# Patient Record
Sex: Male | Born: 1950 | Marital: Married | Smoking: Former smoker
Health system: Southern US, Community
[De-identification: ages and names within clinical notes are randomized; demographics above are authoritative.]

## PROBLEM LIST (undated history)

## (undated) DIAGNOSIS — E119 Type 2 diabetes mellitus without complications: Secondary | ICD-10-CM

## (undated) HISTORY — PX: ELBOW SURGERY: SHX618

## (undated) HISTORY — PX: APPENDECTOMY: SHX54

---
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
## 2008-10-10 ENCOUNTER — Encounter: Payer: Self-pay | Admitting: Gastroenterology

## 2010-05-10 ENCOUNTER — Encounter (INDEPENDENT_AMBULATORY_CARE_PROVIDER_SITE_OTHER): Payer: Self-pay | Admitting: *Deleted

## 2010-06-18 ENCOUNTER — Encounter (INDEPENDENT_AMBULATORY_CARE_PROVIDER_SITE_OTHER): Payer: Self-pay | Admitting: *Deleted

## 2010-06-18 ENCOUNTER — Ambulatory Visit: Payer: Self-pay | Admitting: Internal Medicine

## 2010-06-29 ENCOUNTER — Telehealth: Payer: Self-pay | Admitting: Internal Medicine

## 2011-01-27 NOTE — Miscellaneous (Signed)
Summary: [facility] Previsit/prep  Clinical Lists Changes  Medications: Added new medication of MOVIPREP 100 GM  SOLR (PEG-KCL-NACL-NASULF-NA ASC-C) As per prep instructions. - Signed Rx of MOVIPREP 100 GM  SOLR (PEG-KCL-NACL-NASULF-NA ASC-C) As per prep instructions.;  #1 x 0;  Signed;  Entered by: Wyona Almas RN;  Authorized by: Iva Boop MD, [facility];  Method used: Electronically to [facility] [phone]*, [address] [place], [place]  [address], Ph: [phone], Fax: [phone] Allergies: Added new allergy or adverse reaction of OXYCODONE HCL Observations: Added new observation of NKA: F (06/18/2010 14:13)    Prescriptions: MOVIPREP 100 GM  SOLR (PEG-KCL-NACL-NASULF-NA ASC-C) As per prep instructions.  #1 x 0   Entered by:   Wyona Almas RN   Authorized by:   Iva Boop MD, [facility]   Signed by:   Wyona Almas RN on 06/18/2010   Method used:   Electronically to        [facility] [phone]* (retail)       [address]       [place], [place]  [address]       Ph: [phone]       Fax: [phone]   RxID:   [id number]

## 2011-01-27 NOTE — Letter (Signed)
Summary: Previsit letter  [facility]  [address] [place], [place] [address]   Phone: [phone]  Fax: [phone]       05/10/2010 MRN: [id number]  [facility] [address] [place], [place]  [address]  Dear Mr. Sergio Dominguez,  Welcome to the Gastroenterology Division at [facility].    You are scheduled to see a nurse for your pre-procedure visit on 06-16-10 at 8:00a.m. on the 3rd floor at [facility], [address]  We ask that you try to arrive at our office 15 minutes prior to your appointment time to allow for check-in.  Your nurse visit will consist of discussing your medical and surgical history, your immediate family medical history, and your medications.    Please bring a complete list of all your medications or, if you prefer, bring the medication bottles and we will list them.  We will need to be aware of both prescribed and over the counter drugs.  We will need to know exact dosage information as well.  If you are on blood thinners (Coumadin, Plavix, Aggrenox, Ticlid, etc.) please call our office today/prior to your appointment, as we need to consult with your physician about holding your medication.   Please be prepared to read and sign documents such as consent forms, a financial agreement, and acknowledgement forms.  If necessary, and with your consent, a friend or relative is welcome to sit-in on the nurse visit with you.  Please bring your insurance card so that we may make a copy of it.  If your insurance requires a referral to see a specialist, please bring your referral form from your primary care physician.  No co-pay is required for this nurse visit.     If you cannot keep your appointment, please call [phone] to cancel or reschedule prior to your appointment date.  This allows [place] the opportunity to schedule an appointment for another patient in need of care.    Thank you for choosing [facility] for your medical needs.   We appreciate the opportunity to care for you.  Please visit [place] at our website  to learn more about our practice.                     Sincerely.                                                                                                                   The Gastroenterology Division

## 2011-01-27 NOTE — Letter (Signed)
Summary: [facility] Instructions  [facility]  [address] [place], [place] [address]   Phone: [phone]  Fax: [phone]       LEXIE Dominguez    [birth year]    MRN: [id number]        Procedure Day [name]:  [facility]  06/30/10     Arrival Time:  7:30AM     Procedure Time:  8:30AM     Location of Procedure:                    Juliann Pares _  [facility] (4th Floor)                        PREPARATION FOR COLONOSCOPY WITH MOVIPREP   Starting 5 days prior to your procedure 06/25/10 do not eat nuts, seeds, popcorn, corn, beans, peas,  salads, or any raw vegetables.  Do not take any fiber supplements (e.g. Metamucil, Citrucel, and Benefiber).  THE DAY BEFORE YOUR PROCEDURE         DATE: 06/29/10  DAY: TUESDAY  1.  Drink clear liquids the entire day-NO SOLID FOOD  2.  Do not drink anything colored red or purple.  Avoid juices with pulp.  No orange juice.  3.  Drink at least 64 oz. (8 glasses) of fluid/clear liquids during the day to prevent dehydration and help the prep work efficiently.  CLEAR LIQUIDS INCLUDE: Water Jello Ice Popsicles Tea (sugar ok, no milk/cream) Powdered fruit flavored drinks Coffee (sugar ok, no milk/cream) Gatorade Juice: apple, white grape, white cranberry  Lemonade Clear bullion, consomm, broth Carbonated beverages (any kind) Strained chicken noodle soup Hard Candy                             4.  In the morning, mix first dose of MoviPrep solution:    Empty 1 Pouch A and 1 Pouch B into the disposable container    Add lukewarm drinking water to the top line of the container. Mix to dissolve    Refrigerate (mixed solution should be used within 24 hrs)  5.  Begin drinking the prep at 5:00 p.m. The MoviPrep container is divided by 4 marks.   Every 15 minutes drink the solution down to the next mark (approximately 8 oz) until the full liter is complete.   6.  Follow completed prep with 16 oz of clear liquid of your choice (Nothing  red or purple).  Continue to drink clear liquids until bedtime.  7.  Before going to bed, mix second dose of MoviPrep solution:    Empty 1 Pouch A and 1 Pouch B into the disposable container    Add lukewarm drinking water to the top line of the container. Mix to dissolve    Refrigerate  THE DAY OF YOUR PROCEDURE      DATE: 06/30/10  DAY: WEDNESDAY  Beginning at 3:30AM (5 hours before procedure):         1. Every 15 minutes, drink the solution down to the next mark (approx 8 oz) until the full liter is complete.  2. Follow completed prep with 16 oz. of clear liquid of your choice.    3. You may drink clear liquids until 6:30AM (2 HOURS BEFORE PROCEDURE).   MEDICATION INSTRUCTIONS  Unless otherwise instructed, you should take regular prescription medications with a small sip of water   as early as possible the morning  of your procedure.  Diabetic patients - see separate instructions.          OTHER INSTRUCTIONS  You will need a responsible adult at least 61 years of age to accompany you and drive you home.   This person must remain in the waiting room during your procedure.  Wear loose fitting clothing that is easily removed.  Leave jewelry and other valuables at home.  However, you may wish to bring a book to read or  an iPod/MP3 player to listen to music as you wait for your procedure to start.  Remove all body piercing jewelry and leave at home.  Total time from sign-in until discharge is approximately 2-3 hours.  You should go home directly after your procedure and rest.  You can resume normal activities the  day after your procedure.  The day of your procedure you should not:   Drive   Make legal decisions   Operate machinery   Drink alcohol   Return to work  You will receive specific instructions about eating, activities and medications before you leave.    The above instructions have been reviewed and explained to me by   Wyona Almas RN  June 18, 2010 3:25 PM     I fully understand and can verbalize these instructions _____________________________ Date _________

## 2011-01-27 NOTE — Progress Notes (Signed)
Summary: Cancel Colonoscopy  Phone Note Call from Patient   Caller: Patient Summary of Call: Pt called to cancel his colonoscopy for 06/30/10 as he has no transportation.  Advised pt he could be charged cancellation fee for cancelling the day before his procedure.  Dr. Leone Payor do you want to charge cancellation fee? Initial call taken by: Francee Piccolo CMA Duncan Dull),  June 29, 2010 11:38 AM  Follow-up for Phone Call        No charge he should reschedule Follow-up by: Iva Boop MD, Clementeen Graham,  June 29, 2010 11:41 AM  Additional Follow-up for Phone Call Additional follow up Details #1::        Patient NOT BILLED. Called pt to urge him to reschedule but he will call back when he has available transportation. Additional Follow-up by: Leanor Kail [facility],  July 06, 2010 4:22 PM

## 2011-01-27 NOTE — Letter (Signed)
Summary: Diabetic Instructions  [facility]  [address] [place], [place] [address]   Phone: [phone]  Fax: [phone]    DEMPSY DAMIANO 1951/10/14 MRN: [id number]   _  x_   ORAL DIABETIC MEDICATION INSTRUCTIONS  The day before your procedure:   Take your diabetic pill as you do normally  The day of your procedure:   Do not take your diabetic pill    We will check your blood sugar levels during the admission process and again in Recovery before discharging you home  ________________________________________________________________________

## 2012-09-25 ENCOUNTER — Other Ambulatory Visit: Payer: Self-pay | Admitting: Family Medicine

## 2012-09-25 DIAGNOSIS — Z136 Encounter for screening for cardiovascular disorders: Secondary | ICD-10-CM

## 2012-09-27 ENCOUNTER — Other Ambulatory Visit: Payer: Self-pay

## 2015-08-19 ENCOUNTER — Other Ambulatory Visit: Payer: Self-pay | Admitting: Family Medicine

## 2015-08-19 DIAGNOSIS — Z136 Encounter for screening for cardiovascular disorders: Secondary | ICD-10-CM

## 2015-08-24 ENCOUNTER — Ambulatory Visit
Admission: RE | Admit: 2015-08-24 | Discharge: 2015-08-24 | Disposition: A | Discharge status: Home / Self Care | Payer: Self-pay | Source: Ambulatory Visit | Attending: Family Medicine | Admitting: Family Medicine

## 2015-08-24 DIAGNOSIS — Z136 Encounter for screening for cardiovascular disorders: Secondary | ICD-10-CM

## 2019-12-17 ENCOUNTER — Other Ambulatory Visit: Payer: Self-pay

## 2019-12-17 ENCOUNTER — Encounter (HOSPITAL_COMMUNITY): Payer: Self-pay

## 2019-12-17 ENCOUNTER — Emergency Department (HOSPITAL_COMMUNITY): Payer: Medicare Other

## 2019-12-17 ENCOUNTER — Inpatient Hospital Stay (HOSPITAL_COMMUNITY)
Admission: EM | Admit: 2019-12-17 | Discharge: 2019-12-21 | DRG: 071 | Disposition: A | Discharge status: Home / Self Care | Payer: Medicare Other | Attending: Internal Medicine | Admitting: Internal Medicine

## 2019-12-17 DIAGNOSIS — S32039A Unspecified fracture of third lumbar vertebra, initial encounter for closed fracture: Secondary | ICD-10-CM | POA: Diagnosis present

## 2019-12-17 DIAGNOSIS — Z9181 History of falling: Secondary | ICD-10-CM

## 2019-12-17 DIAGNOSIS — Z781 Physical restraint status: Secondary | ICD-10-CM

## 2019-12-17 DIAGNOSIS — G9340 Encephalopathy, unspecified: Secondary | ICD-10-CM | POA: Diagnosis not present

## 2019-12-17 DIAGNOSIS — E872 Acidosis: Secondary | ICD-10-CM | POA: Diagnosis present

## 2019-12-17 DIAGNOSIS — M4850XA Collapsed vertebra, not elsewhere classified, site unspecified, initial encounter for fracture: Secondary | ICD-10-CM | POA: Diagnosis present

## 2019-12-17 DIAGNOSIS — J019 Acute sinusitis, unspecified: Secondary | ICD-10-CM | POA: Diagnosis present

## 2019-12-17 DIAGNOSIS — E1165 Type 2 diabetes mellitus with hyperglycemia: Secondary | ICD-10-CM | POA: Diagnosis present

## 2019-12-17 DIAGNOSIS — M6282 Rhabdomyolysis: Secondary | ICD-10-CM | POA: Diagnosis present

## 2019-12-17 DIAGNOSIS — Z885 Allergy status to narcotic agent status: Secondary | ICD-10-CM

## 2019-12-17 DIAGNOSIS — Y929 Unspecified place or not applicable: Secondary | ICD-10-CM

## 2019-12-17 DIAGNOSIS — E785 Hyperlipidemia, unspecified: Secondary | ICD-10-CM | POA: Diagnosis present

## 2019-12-17 DIAGNOSIS — S32019A Unspecified fracture of first lumbar vertebra, initial encounter for closed fracture: Secondary | ICD-10-CM | POA: Diagnosis present

## 2019-12-17 DIAGNOSIS — R509 Fever, unspecified: Secondary | ICD-10-CM

## 2019-12-17 DIAGNOSIS — D72829 Elevated white blood cell count, unspecified: Secondary | ICD-10-CM | POA: Diagnosis present

## 2019-12-17 DIAGNOSIS — S32059A Unspecified fracture of fifth lumbar vertebra, initial encounter for closed fracture: Secondary | ICD-10-CM | POA: Diagnosis present

## 2019-12-17 DIAGNOSIS — Z7984 Long term (current) use of oral hypoglycemic drugs: Secondary | ICD-10-CM

## 2019-12-17 DIAGNOSIS — R821 Myoglobinuria: Secondary | ICD-10-CM | POA: Diagnosis present

## 2019-12-17 DIAGNOSIS — Z20828 Contact with and (suspected) exposure to other viral communicable diseases: Secondary | ICD-10-CM | POA: Diagnosis present

## 2019-12-17 DIAGNOSIS — J329 Chronic sinusitis, unspecified: Secondary | ICD-10-CM

## 2019-12-17 DIAGNOSIS — R4182 Altered mental status, unspecified: Secondary | ICD-10-CM

## 2019-12-17 DIAGNOSIS — R739 Hyperglycemia, unspecified: Secondary | ICD-10-CM | POA: Diagnosis present

## 2019-12-17 DIAGNOSIS — E119 Type 2 diabetes mellitus without complications: Secondary | ICD-10-CM

## 2019-12-17 DIAGNOSIS — S32029A Unspecified fracture of second lumbar vertebra, initial encounter for closed fracture: Secondary | ICD-10-CM | POA: Diagnosis present

## 2019-12-17 DIAGNOSIS — Z8781 Personal history of (healed) traumatic fracture: Secondary | ICD-10-CM

## 2019-12-17 DIAGNOSIS — S22059A Unspecified fracture of T5-T6 vertebra, initial encounter for closed fracture: Secondary | ICD-10-CM | POA: Diagnosis present

## 2019-12-17 DIAGNOSIS — X58XXXA Exposure to other specified factors, initial encounter: Secondary | ICD-10-CM | POA: Diagnosis present

## 2019-12-17 DIAGNOSIS — S22049A Unspecified fracture of fourth thoracic vertebra, initial encounter for closed fracture: Secondary | ICD-10-CM | POA: Diagnosis present

## 2019-12-17 HISTORY — DX: Type 2 diabetes mellitus without complications: E11.9

## 2019-12-17 LAB — CBC WITH DIFFERENTIAL/PLATELET
Abs Immature Granulocytes: 0.17 10*3/uL — ABNORMAL HIGH (ref 0.00–0.07)
Basophils Absolute: 0 10*3/uL (ref 0.0–0.1)
Basophils Relative: 0 %
Eosinophils Absolute: 0 10*3/uL (ref 0.0–0.5)
Eosinophils Relative: 0 %
HCT: 46.4 % (ref 39.0–52.0)
Hemoglobin: 15.3 g/dL (ref 13.0–17.0)
Immature Granulocytes: 1 %
Lymphocytes Relative: 1 %
Lymphs Abs: 0.3 10*3/uL — ABNORMAL LOW (ref 0.7–4.0)
MCH: 30.8 pg (ref 26.0–34.0)
MCHC: 33 g/dL (ref 30.0–36.0)
MCV: 93.5 fL (ref 80.0–100.0)
Monocytes Absolute: 0.8 10*3/uL (ref 0.1–1.0)
Monocytes Relative: 4 %
Neutro Abs: 19.6 10*3/uL — ABNORMAL HIGH (ref 1.7–7.7)
Neutrophils Relative %: 94 %
Platelets: 178 10*3/uL (ref 150–400)
RBC: 4.96 MIL/uL (ref 4.22–5.81)
RDW: 14.1 % (ref 11.5–15.5)
WBC: 20.9 10*3/uL — ABNORMAL HIGH (ref 4.0–10.5)
nRBC: 0 % (ref 0.0–0.2)

## 2019-12-17 LAB — CBG MONITORING, ED: Glucose-Capillary: 200 mg/dL — ABNORMAL HIGH (ref 70–99)

## 2019-12-17 LAB — LIPASE, BLOOD: Lipase: 20 U/L (ref 11–51)

## 2019-12-17 LAB — URINALYSIS, ROUTINE W REFLEX MICROSCOPIC
Bilirubin Urine: NEGATIVE
Glucose, UA: >=500 mg/dL — AB
Ketones, ur: 20 mg/dL — AB
Leukocytes,Ua: NEGATIVE
Nitrite: NEGATIVE
Protein, ur: 30 mg/dL — AB
Specific Gravity, Urine: 1.017 (ref 1.005–1.030)
pH: 5 (ref 5.0–8.0)

## 2019-12-17 LAB — I-STAT CHEM 8, ED
BUN: 13 mg/dL (ref 8–23)
Calcium, Ion: 1.03 mmol/L — ABNORMAL LOW (ref 1.15–1.40)
Chloride: 107 mmol/L (ref 98–111)
Creatinine, Ser: 0.7 mg/dL (ref 0.61–1.24)
Glucose, Bld: 208 mg/dL — ABNORMAL HIGH (ref 70–99)
HCT: 45 % (ref 39.0–52.0)
Hemoglobin: 15.3 g/dL (ref 13.0–17.0)
Potassium: 4.2 mmol/L (ref 3.5–5.1)
Sodium: 138 mmol/L (ref 135–145)
TCO2: 24 mmol/L (ref 22–32)

## 2019-12-17 LAB — ETHANOL: Alcohol, Ethyl (B): <10 mg/dL (ref <10)

## 2019-12-17 LAB — MAGNESIUM: Magnesium: 1.9 mg/dL (ref 1.7–2.4)

## 2019-12-17 LAB — LACTIC ACID, PLASMA
Lactic Acid, Venous: 2.8 mmol/L (ref 0.5–1.9)
Lactic Acid, Venous: 2.8 mmol/L (ref 0.5–1.9)

## 2019-12-17 LAB — COMPREHENSIVE METABOLIC PANEL
ALT: 33 U/L (ref 0–44)
AST: 36 U/L (ref 15–41)
Albumin: 3.8 g/dL (ref 3.5–5.0)
Alkaline Phosphatase: 86 U/L (ref 38–126)
Anion gap: 13 (ref 5–15)
BUN: 10 mg/dL (ref 8–23)
CO2: 20 mmol/L — ABNORMAL LOW (ref 22–32)
Calcium: 9 mg/dL (ref 8.9–10.3)
Chloride: 104 mmol/L (ref 98–111)
Creatinine, Ser: 1.02 mg/dL (ref 0.61–1.24)
GFR calc Af Amer: >60 mL/min (ref >60)
GFR calc non Af Amer: >60 mL/min (ref >60)
Glucose, Bld: 217 mg/dL — ABNORMAL HIGH (ref 70–99)
Potassium: 3.8 mmol/L (ref 3.5–5.1)
Sodium: 137 mmol/L (ref 135–145)
Total Bilirubin: 1.1 mg/dL (ref 0.3–1.2)
Total Protein: 7 g/dL (ref 6.5–8.1)

## 2019-12-17 LAB — SALICYLATE LEVEL: Salicylate Lvl: <7 mg/dL — ABNORMAL LOW (ref 7.0–30.0)

## 2019-12-17 LAB — AMMONIA: Ammonia: 15 umol/L (ref 9–35)

## 2019-12-17 LAB — TROPONIN I (HIGH SENSITIVITY)
Troponin I (High Sensitivity): 31 ng/L — ABNORMAL HIGH (ref <18)
Troponin I (High Sensitivity): 38 ng/L — ABNORMAL HIGH (ref <18)

## 2019-12-17 LAB — CK: Total CK: 1037 U/L — ABNORMAL HIGH (ref 49–397)

## 2019-12-17 LAB — POC SARS CORONAVIRUS 2 AG -  ED: SARS Coronavirus 2 Ag: NEGATIVE

## 2019-12-17 LAB — RAPID URINE DRUG SCREEN, HOSP PERFORMED
Amphetamines: NOT DETECTED
Barbiturates: NOT DETECTED
Benzodiazepines: NOT DETECTED
Cocaine: NOT DETECTED
Opiates: NOT DETECTED
Tetrahydrocannabinol: POSITIVE — AB

## 2019-12-17 LAB — PROTIME-INR
INR: 1.2 (ref 0.8–1.2)
Prothrombin Time: 15.3 seconds — ABNORMAL HIGH (ref 11.4–15.2)

## 2019-12-17 LAB — TSH: TSH: 0.943 u[IU]/mL (ref 0.350–4.500)

## 2019-12-17 LAB — ACETAMINOPHEN LEVEL: Acetaminophen (Tylenol), Serum: <10 ug/mL — ABNORMAL LOW (ref 10–30)

## 2019-12-17 MED ORDER — SODIUM CHLORIDE 0.9 % IV BOLUS (SEPSIS)
250.0000 mL | Freq: Once | INTRAVENOUS | Status: AC
  Administered 2019-12-17: 250 mL via INTRAVENOUS

## 2019-12-17 MED ORDER — ACETAMINOPHEN 650 MG RE SUPP
650.0000 mg | Freq: Once | RECTAL | Status: AC
  Administered 2019-12-17: 650 mg via RECTAL
  Filled 2019-12-17: qty 1

## 2019-12-17 MED ORDER — SODIUM CHLORIDE 0.9 % IV SOLN: 1000.0000 mL | INTRAVENOUS | Status: DC

## 2019-12-17 MED ORDER — LORAZEPAM 2 MG/ML IJ SOLN
1.0000 mg | Freq: Once | INTRAMUSCULAR | Status: AC
  Administered 2019-12-17: 1 mg via INTRAVENOUS
  Filled 2019-12-17: qty 1

## 2019-12-17 MED ORDER — SODIUM CHLORIDE 0.9 % IV SOLN
1.0000 g | Freq: Once | INTRAVENOUS | Status: AC
  Administered 2019-12-17: 1 g via INTRAVENOUS
  Filled 2019-12-17: qty 10

## 2019-12-17 MED ORDER — SODIUM CHLORIDE 0.9 % IV SOLN
500.0000 mg | Freq: Once | INTRAVENOUS | Status: AC
  Administered 2019-12-17: 500 mg via INTRAVENOUS
  Filled 2019-12-17: qty 500

## 2019-12-17 MED ORDER — SODIUM CHLORIDE 0.9 % IV BOLUS (SEPSIS)
1000.0000 mL | Freq: Once | INTRAVENOUS | Status: AC
  Administered 2019-12-17: 1000 mL via INTRAVENOUS

## 2019-12-17 MED ORDER — SODIUM CHLORIDE 0.9 % IV BOLUS
30.0000 mL/kg | Freq: Once | INTRAVENOUS | Status: AC
  Administered 2019-12-17: 2010 mL via INTRAVENOUS

## 2019-12-17 MED ORDER — SODIUM CHLORIDE 0.9 % IV SOLN
1000.0000 mL | INTRAVENOUS | Status: DC
  Administered 2019-12-17: 22:00:00 1000 mL via INTRAVENOUS

## 2019-12-17 MED ORDER — IOHEXOL 350 MG/ML SOLN
100.0000 mL | Freq: Once | INTRAVENOUS | Status: AC | PRN
  Administered 2019-12-18: 100 mL via INTRAVENOUS

## 2019-12-17 NOTE — ED Provider Notes (Signed)
[facility] EMERGENCY DEPARTMENT Provider Note   CSN: [id number] Arrival date & time: 12/17/19  1721     History Chief Complaint  Patient presents with  . Altered Mental Status    Sergio Dominguez is a 68 y.o. male.  HPI History is per EMS.  Patient is confused and cannot give any meaningful history.  Reportedly patient has limited medical problems including type 2 diabetes on Metformin, hyperlipidemia.  Reportedly the patient was normal at 430 this morning.  That was the last time that his wife saw him.  Also by report, he drove his son to work sometime this morning.  His wife came home around 4 PM and found him extremely confused and either lying on the floor trying to stagger to the bathroom.  On EMS arrival the patient was lying on his side on the floor.  They were starting assessment and he became agitated and combative.  At no point was he able to answer any meaningful questions.  He has been moving all extremities.  They report vital signs were within normal range.  Patient was normotensive without hypoxia and regular heart rate.  No hypoglycemia.  They established peripheral access and transported to the hospital.  Is not had any seizure activity during their observation.  No known seizure history.  EMS reports that per his wife there is no alcohol or drug abuse history.    No past medical history on file.  There are no problems to display for this patient.     No family history on file.  Social History   Tobacco Use  . Smoking status: Not on file  Substance Use Topics  . Alcohol use: Not on file  . Drug use: Not on file    Home Medications Prior to Admission medications   Not on File    Allergies    Oxycodone hcl  Review of Systems   Review of Systems Level 5 caveat cannot obtain review of systems due to patient condition. Physical Exam Updated Vital Signs There were no vitals taken for this visit.  Physical Exam Constitutional:       Comments: Patient is thin but well-nourished and well-developed.  No respiratory distress.  He is very confused and slightly combative.  He is being restrained in the stretcher.  Patient is saying "let me go".  He cannot seem to say anything else.  HENT:     Head: Normocephalic and atraumatic.     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  No other palpable hematomas or scalp swelling.  No evident facial trauma.    Nose: Nose normal.     Mouth/Throat:     Mouth: Mucous membranes are moist.     Pharynx: Oropharynx is clear.     Comments: Patient's voice is clear.  No pooling secretions in the airway. Eyes:     Comments: Pupils are 3 mm symmetric and responsive.  The eye motions are conjugate.  Cardiovascular:     Rate and Rhythm: Normal rate and regular rhythm.  Pulmonary:     Effort: Pulmonary effort is normal.     Breath sounds: Normal breath sounds.     Comments: Breath sounds are clear bilaterally.  No respiratory distress. Abdominal:     Comments: Abdomen is flat soft and nondistended.  Patient does not guard or react excessively to abdominal palpation.  Musculoskeletal:     Cervical back: Neck supple.  Comments: Extremities do not show any deformities.  They are symmetric and well formed.  No significant peripheral edema.  Extremities are warm and dry.  Skin:    General: Skin is warm and dry.  Neurological:     Comments: Patient is very confused.  He is somewhat somnolent but agitated.  He is resisting restraint but not aggressively so.  He is able to clearly enunciate "let me go".  He however will not answer any other questions or make clear meaningful statements.  He does seem to have symmetric strength.  He is trying to pull from restraint.  When I placed my hands in his he will perform some grip strength for me.  There does not appear to be a localizing motor deficit.  Appearance is very suggestive of delirium.     ED Results /  Procedures / Treatments   Labs (all labs ordered are listed, but only abnormal results are displayed) Labs Reviewed  CBG MONITORING, ED - Abnormal; Notable for the following components:      Result Value   Glucose-Capillary 200 (*)    All other components within normal limits  COMPREHENSIVE METABOLIC PANEL  ETHANOL  ACETAMINOPHEN LEVEL  LIPASE, BLOOD  SALICYLATE LEVEL  LACTIC ACID, PLASMA  LACTIC ACID, PLASMA  CBC WITH DIFFERENTIAL/PLATELET  PROTIME-INR  URINALYSIS, ROUTINE W REFLEX MICROSCOPIC  RAPID URINE DRUG SCREEN, HOSP PERFORMED  MAGNESIUM  TSH  AMMONIA  CK  I-STAT CHEM 8, ED  POC SARS CORONAVIRUS 2 AG -  ED  TROPONIN I (HIGH SENSITIVITY)    EKG EKG Interpretation  Date/Time:  Tuesday December 17 2019 17:25:54 EST Ventricular Rate:  108 PR Interval:    QRS Duration: 93 QT Interval:  366 QTC Calculation: 491 R Axis:   21 Text Interpretation: Sinus tachycardia Biatrial enlargement RSR' in V1 or V2, probably normal variant Borderline prolonged QT interval agree, no STEMI, no old comparison Confirmed by Charlesetta Shanks [phone]) on 12/17/2019 6:05:52 PM   Radiology No results found.  Procedures Procedures (including critical care time) CRITICAL CARE Performed by: Charlesetta Shanks   Total critical care time: 60 minutes  Critical care time was exclusive of separately billable procedures and treating other patients.  Critical care was necessary to treat or prevent imminent or life-threatening deterioration.  Critical care was time spent personally by me on the following activities: development of treatment plan with patient and/or surrogate as well as nursing, discussions with consultants, evaluation of patient's response to treatment, examination of patient, obtaining history from patient or surrogate, ordering and performing treatments and interventions, ordering and review of laboratory studies, ordering and review of radiographic studies, pulse oximetry and  re-evaluation of patient's condition. Medications Ordered in ED Medications  LORazepam (ATIVAN) injection 1 mg (has no administration in time range)  sodium chloride 0.9 % bolus 250 mL (has no administration in time range)    Followed by  0.9 %  sodium chloride infusion (has no administration in time range)    ED Course  I have reviewed the triage vital signs and the nursing notes.  Pertinent labs & imaging results that were available during my care of the patient were reviewed by me and considered in my medical decision making (see chart for details).  Clinical Course as of Dec 17 32  Tue Dec 17, 2019  1757 I have done preliminary review of CT scan and did not see any large intracranial or subdural bleeds.  No midline shift.  No striking anomalies.  Will wait  for radiology interpretation.   [MP]  2238 I reassessed the patient.  He was resting but I awakened him to try to ask more questions.  He awakened and he would very clearly and repetitively say "please take these off of me".  He was clearly angry or frustrated about being restrained.  His speech of that one repetitive sentence was very clear.  I asked him why he would not answer any other questions, he stated "just take these off of me."  I asked several times if he had a headache, initially he would not answer and then he said "no, I do not have a headache."  Nonetheless, he was pulling at the restraints and trying to sit up.    [MP]  2241 Patient cannot safely be taken out of restraints at this time.  He is at risk of fall and potentially pulling out his IVs.  He requires further evaluation including CT scan of the chest and the abdomen.  Patient is febrile.  Is unclear why he very clearly reiterates his desire to be taken out of restraints but will not answer any other questions.  Patient will be given 1 mg of Ativan for sedation to facilitate continuing work-up.   [MP]  Wed Dec 18, 2019  0024 Patient pulled his IV out once he was  at CT scan.  CT scan is now still pending.  He has been replaced.  Patient's wife is at bedside.  Patient will sleep but then awakens and becomes agitated.  He clearly states, "take these off of me, untangle me I have to piss."  He however still is not giving any history or interacting normally.  Wife reports this is very atypical for him.  Blood pressure and heart rate remain normal.   [MP]    Clinical Course User Index [MP] Arby BarrettePfeiffer, Ammie Warrick, MD   MDM Rules/Calculators/A&P                      Patient presents with delirium appearance of unknown etiology.  Reportedly he was well yesterday and last seen normal at 4:30 AM this morning.  His exam does not suggest a lateralizing motor deficit.  CBG is mid 200s.  Patient is known diabetic.  Blood pressure is normotensive.  No apparent hypertensive encephalopathy.  Plan we be to get the patient to CT head stat for possible head injury or bleed.  Other orders placed to evaluate for metabolic conditions and infectious conditions.  Patient has been given 1 dose of Ativan for agitation to facilitate CT scan.  Will initiate fluid hydration.  No acute findings on CT head.  X-ray shows possible infiltrate.  Patient will need to proceed with CT scan chest and abdomen however due to combativeness and pulling out his IV this has been delayed.  Biotics have been infused.  Vital signs remained stable.  Patient remains very confused. Final Clinical Impression(s) / ED Diagnoses Final diagnoses:  None    Rx / DC Orders ED Discharge Orders    None       Arby BarrettePfeiffer, Iveth Heidemann, MD 12/18/19 [phone]

## 2019-12-17 NOTE — ED Notes (Signed)
Ayana (Daughter# [phone]) called for an update.

## 2019-12-17 NOTE — ED Notes (Signed)
Sergio Dominguez [phone] wife is looking for an update on the patient

## 2019-12-17 NOTE — ED Notes (Signed)
Pt extremely agitated and confused.

## 2019-12-17 NOTE — ED Notes (Signed)
Urine culture sent to lab with sample 

## 2019-12-17 NOTE — ED Triage Notes (Signed)
Pt bib [facility] w/ c/o AMS. LKN 0430. Pt unresponsive when EMS arrived, woke up with EMS and extremely agitated. Unable to follow commands or answer questions.

## 2019-12-18 ENCOUNTER — Inpatient Hospital Stay (HOSPITAL_COMMUNITY): Payer: Medicare Other

## 2019-12-18 ENCOUNTER — Emergency Department (HOSPITAL_COMMUNITY): Payer: Medicare Other

## 2019-12-18 ENCOUNTER — Encounter (HOSPITAL_COMMUNITY): Payer: Self-pay | Admitting: Radiology

## 2019-12-18 DIAGNOSIS — D72829 Elevated white blood cell count, unspecified: Secondary | ICD-10-CM | POA: Diagnosis not present

## 2019-12-18 DIAGNOSIS — Z20828 Contact with and (suspected) exposure to other viral communicable diseases: Secondary | ICD-10-CM | POA: Diagnosis present

## 2019-12-18 DIAGNOSIS — Z8781 Personal history of (healed) traumatic fracture: Secondary | ICD-10-CM | POA: Diagnosis not present

## 2019-12-18 DIAGNOSIS — M4850XA Collapsed vertebra, not elsewhere classified, site unspecified, initial encounter for fracture: Secondary | ICD-10-CM | POA: Diagnosis present

## 2019-12-18 DIAGNOSIS — E785 Hyperlipidemia, unspecified: Secondary | ICD-10-CM | POA: Diagnosis present

## 2019-12-18 DIAGNOSIS — S22049A Unspecified fracture of fourth thoracic vertebra, initial encounter for closed fracture: Secondary | ICD-10-CM | POA: Diagnosis present

## 2019-12-18 DIAGNOSIS — E119 Type 2 diabetes mellitus without complications: Secondary | ICD-10-CM | POA: Diagnosis not present

## 2019-12-18 DIAGNOSIS — R739 Hyperglycemia, unspecified: Secondary | ICD-10-CM | POA: Diagnosis present

## 2019-12-18 DIAGNOSIS — J019 Acute sinusitis, unspecified: Secondary | ICD-10-CM | POA: Diagnosis present

## 2019-12-18 DIAGNOSIS — M6282 Rhabdomyolysis: Secondary | ICD-10-CM | POA: Diagnosis present

## 2019-12-18 DIAGNOSIS — G9340 Encephalopathy, unspecified: Principal | ICD-10-CM

## 2019-12-18 DIAGNOSIS — Z781 Physical restraint status: Secondary | ICD-10-CM | POA: Diagnosis not present

## 2019-12-18 DIAGNOSIS — S32029A Unspecified fracture of second lumbar vertebra, initial encounter for closed fracture: Secondary | ICD-10-CM | POA: Diagnosis present

## 2019-12-18 DIAGNOSIS — X58XXXA Exposure to other specified factors, initial encounter: Secondary | ICD-10-CM | POA: Diagnosis present

## 2019-12-18 DIAGNOSIS — R821 Myoglobinuria: Secondary | ICD-10-CM | POA: Diagnosis present

## 2019-12-18 DIAGNOSIS — S32039A Unspecified fracture of third lumbar vertebra, initial encounter for closed fracture: Secondary | ICD-10-CM | POA: Diagnosis present

## 2019-12-18 DIAGNOSIS — Z885 Allergy status to narcotic agent status: Secondary | ICD-10-CM | POA: Diagnosis not present

## 2019-12-18 DIAGNOSIS — E872 Acidosis: Secondary | ICD-10-CM | POA: Diagnosis present

## 2019-12-18 DIAGNOSIS — R509 Fever, unspecified: Secondary | ICD-10-CM | POA: Diagnosis not present

## 2019-12-18 DIAGNOSIS — Z9181 History of falling: Secondary | ICD-10-CM | POA: Diagnosis not present

## 2019-12-18 DIAGNOSIS — S22059A Unspecified fracture of T5-T6 vertebra, initial encounter for closed fracture: Secondary | ICD-10-CM | POA: Diagnosis present

## 2019-12-18 DIAGNOSIS — E1165 Type 2 diabetes mellitus with hyperglycemia: Secondary | ICD-10-CM | POA: Diagnosis present

## 2019-12-18 DIAGNOSIS — S32019A Unspecified fracture of first lumbar vertebra, initial encounter for closed fracture: Secondary | ICD-10-CM | POA: Diagnosis present

## 2019-12-18 DIAGNOSIS — S32000A Wedge compression fracture of unspecified lumbar vertebra, initial encounter for closed fracture: Secondary | ICD-10-CM | POA: Diagnosis not present

## 2019-12-18 DIAGNOSIS — Y929 Unspecified place or not applicable: Secondary | ICD-10-CM | POA: Diagnosis not present

## 2019-12-18 DIAGNOSIS — J0140 Acute pansinusitis, unspecified: Secondary | ICD-10-CM | POA: Diagnosis not present

## 2019-12-18 DIAGNOSIS — S32059A Unspecified fracture of fifth lumbar vertebra, initial encounter for closed fracture: Secondary | ICD-10-CM | POA: Diagnosis present

## 2019-12-18 DIAGNOSIS — Z7984 Long term (current) use of oral hypoglycemic drugs: Secondary | ICD-10-CM | POA: Diagnosis not present

## 2019-12-18 DIAGNOSIS — R4182 Altered mental status, unspecified: Secondary | ICD-10-CM | POA: Diagnosis not present

## 2019-12-18 LAB — CBC WITH DIFFERENTIAL/PLATELET
Abs Immature Granulocytes: 0.19 10*3/uL — ABNORMAL HIGH (ref 0.00–0.07)
Basophils Absolute: 0 10*3/uL (ref 0.0–0.1)
Basophils Relative: 0 %
Eosinophils Absolute: 0 10*3/uL (ref 0.0–0.5)
Eosinophils Relative: 0 %
HCT: 42.6 % (ref 39.0–52.0)
Hemoglobin: 14.1 g/dL (ref 13.0–17.0)
Immature Granulocytes: 1 %
Lymphocytes Relative: 5 %
Lymphs Abs: 1.1 10*3/uL (ref 0.7–4.0)
MCH: 30.7 pg (ref 26.0–34.0)
MCHC: 33.1 g/dL (ref 30.0–36.0)
MCV: 92.8 fL (ref 80.0–100.0)
Monocytes Absolute: 1.3 10*3/uL — ABNORMAL HIGH (ref 0.1–1.0)
Monocytes Relative: 6 %
Neutro Abs: 18.5 10*3/uL — ABNORMAL HIGH (ref 1.7–7.7)
Neutrophils Relative %: 88 %
Platelets: 161 10*3/uL (ref 150–400)
RBC: 4.59 MIL/uL (ref 4.22–5.81)
RDW: 14.2 % (ref 11.5–15.5)
WBC: 21.1 10*3/uL — ABNORMAL HIGH (ref 4.0–10.5)
nRBC: 0 % (ref 0.0–0.2)

## 2019-12-18 LAB — COMPREHENSIVE METABOLIC PANEL
ALT: 34 U/L (ref 0–44)
AST: 59 U/L — ABNORMAL HIGH (ref 15–41)
Albumin: 3.6 g/dL (ref 3.5–5.0)
Alkaline Phosphatase: 79 U/L (ref 38–126)
Anion gap: 12 (ref 5–15)
BUN: 9 mg/dL (ref 8–23)
CO2: 20 mmol/L — ABNORMAL LOW (ref 22–32)
Calcium: 8.7 mg/dL — ABNORMAL LOW (ref 8.9–10.3)
Chloride: 107 mmol/L (ref 98–111)
Creatinine, Ser: 0.77 mg/dL (ref 0.61–1.24)
GFR calc Af Amer: >60 mL/min (ref >60)
GFR calc non Af Amer: >60 mL/min (ref >60)
Glucose, Bld: 152 mg/dL — ABNORMAL HIGH (ref 70–99)
Potassium: 4 mmol/L (ref 3.5–5.1)
Sodium: 139 mmol/L (ref 135–145)
Total Bilirubin: 1.1 mg/dL (ref 0.3–1.2)
Total Protein: 6.8 g/dL (ref 6.5–8.1)

## 2019-12-18 LAB — APTT: aPTT: 31 seconds (ref 24–36)

## 2019-12-18 LAB — GLUCOSE, CAPILLARY
Glucose-Capillary: 102 mg/dL — ABNORMAL HIGH (ref 70–99)
Glucose-Capillary: 123 mg/dL — ABNORMAL HIGH (ref 70–99)
Glucose-Capillary: 124 mg/dL — ABNORMAL HIGH (ref 70–99)
Glucose-Capillary: 125 mg/dL — ABNORMAL HIGH (ref 70–99)
Glucose-Capillary: 140 mg/dL — ABNORMAL HIGH (ref 70–99)

## 2019-12-18 LAB — LACTIC ACID, PLASMA
Lactic Acid, Venous: 3.1 mmol/L (ref 0.5–1.9)
Lactic Acid, Venous: 2.7 mmol/L (ref 0.5–1.9)

## 2019-12-18 LAB — INFLUENZA PANEL BY PCR (TYPE A & B)
Influenza A By PCR: NEGATIVE
Influenza B By PCR: NEGATIVE

## 2019-12-18 LAB — HIV ANTIBODY (ROUTINE TESTING W REFLEX): HIV Screen 4th Generation wRfx: NONREACTIVE

## 2019-12-18 LAB — SEDIMENTATION RATE: Sed Rate: 11 mm/hr (ref 0–16)

## 2019-12-18 LAB — HEMOGLOBIN A1C
Hgb A1c MFr Bld: 7.8 % — ABNORMAL HIGH (ref 4.8–5.6)
Mean Plasma Glucose: 177.16 mg/dL

## 2019-12-18 LAB — TSH: TSH: 1.699 u[IU]/mL (ref 0.350–4.500)

## 2019-12-18 LAB — MAGNESIUM: Magnesium: 1.8 mg/dL (ref 1.7–2.4)

## 2019-12-18 LAB — CK: Total CK: 3387 U/L — ABNORMAL HIGH (ref 49–397)

## 2019-12-18 LAB — SARS CORONAVIRUS 2 (TAT 6-24 HRS): SARS Coronavirus 2: NEGATIVE

## 2019-12-18 LAB — MRSA PCR SCREENING: MRSA by PCR: NEGATIVE

## 2019-12-18 MED ORDER — ZIPRASIDONE MESYLATE 20 MG IM SOLR
20.0000 mg | Freq: Once | INTRAMUSCULAR | Status: DC
  Filled 2019-12-18: qty 20

## 2019-12-18 MED ORDER — ONDANSETRON HCL 4 MG/2ML IJ SOLN: 4.0000 mg | Freq: Four times a day (QID) | INTRAMUSCULAR | Status: DC | PRN

## 2019-12-18 MED ORDER — HALOPERIDOL LACTATE 5 MG/ML IJ SOLN: 2.0000 mg | Freq: Four times a day (QID) | INTRAMUSCULAR | Status: DC | PRN

## 2019-12-18 MED ORDER — LORAZEPAM 2 MG/ML IJ SOLN
1.0000 mg | INTRAMUSCULAR | Status: DC | PRN
  Administered 2019-12-18 (×2): 1 mg via INTRAVENOUS
  Filled 2019-12-18 (×2): qty 1

## 2019-12-18 MED ORDER — VANCOMYCIN HCL 750 MG/150ML IV SOLN
750.0000 mg | Freq: Two times a day (BID) | INTRAVENOUS | Status: DC
  Administered 2019-12-19 – 2019-12-20 (×3): 750 mg via INTRAVENOUS
  Filled 2019-12-18 (×4): qty 150

## 2019-12-18 MED ORDER — VANCOMYCIN HCL 750 MG/150ML IV SOLN
750.0000 mg | Freq: Two times a day (BID) | INTRAVENOUS | Status: DC
  Administered 2019-12-18: 750 mg via INTRAVENOUS
  Filled 2019-12-18 (×2): qty 150

## 2019-12-18 MED ORDER — VANCOMYCIN HCL 1250 MG/250ML IV SOLN
1250.0000 mg | Freq: Once | INTRAVENOUS | Status: AC
  Administered 2019-12-18: 1250 mg via INTRAVENOUS
  Filled 2019-12-18: qty 250

## 2019-12-18 MED ORDER — ONDANSETRON HCL 4 MG PO TABS: 4.0000 mg | ORAL_TABLET | Freq: Four times a day (QID) | ORAL | Status: DC | PRN

## 2019-12-18 MED ORDER — CHLORHEXIDINE GLUCONATE CLOTH 2 % EX PADS
6.0000 | MEDICATED_PAD | Freq: Every day | CUTANEOUS | Status: DC
  Administered 2019-12-18 – 2019-12-19 (×2): 6 via TOPICAL

## 2019-12-18 MED ORDER — STERILE WATER FOR INJECTION IJ SOLN
INTRAMUSCULAR | Status: AC
  Administered 2019-12-18: 1.2 mL
  Filled 2019-12-18: qty 10

## 2019-12-18 MED ORDER — HALOPERIDOL LACTATE 5 MG/ML IJ SOLN
2.0000 mg | Freq: Once | INTRAMUSCULAR | Status: AC
  Administered 2019-12-18: 2 mg via INTRAVENOUS
  Filled 2019-12-18: qty 1

## 2019-12-18 MED ORDER — LORAZEPAM 2 MG/ML IJ SOLN
1.0000 mg | INTRAMUSCULAR | Status: DC | PRN
  Administered 2019-12-18: 1 mg via INTRAVENOUS
  Filled 2019-12-18: qty 1

## 2019-12-18 MED ORDER — SODIUM CHLORIDE 0.9 % IV SOLN
2.0000 g | INTRAVENOUS | Status: DC
  Administered 2019-12-18 – 2019-12-20 (×12): 2 g via INTRAVENOUS
  Filled 2019-12-18 (×3): qty 2
  Filled 2019-12-18: qty 2000
  Filled 2019-12-18: qty 2
  Filled 2019-12-18: qty 2000
  Filled 2019-12-18: qty 2
  Filled 2019-12-18: qty 2000
  Filled 2019-12-18 (×4): qty 2
  Filled 2019-12-18 (×2): qty 2000
  Filled 2019-12-18 (×2): qty 2
  Filled 2019-12-18: qty 2000

## 2019-12-18 MED ORDER — DEXTROSE 5 % IV SOLN
10.0000 mg/kg | Freq: Three times a day (TID) | INTRAVENOUS | Status: DC
  Administered 2019-12-18 – 2019-12-20 (×7): 670 mg via INTRAVENOUS
  Filled 2019-12-18 (×5): qty 13.4
  Filled 2019-12-18: qty 10
  Filled 2019-12-18 (×3): qty 13.4

## 2019-12-18 MED ORDER — SODIUM CHLORIDE 0.9 % IV SOLN
2.0000 g | Freq: Two times a day (BID) | INTRAVENOUS | Status: DC
  Administered 2019-12-18 – 2019-12-19 (×4): 2 g via INTRAVENOUS
  Filled 2019-12-18: qty 20
  Filled 2019-12-18 (×3): qty 2
  Filled 2019-12-18 (×2): qty 20

## 2019-12-18 MED ORDER — ACETAMINOPHEN 325 MG PO TABS: 650.0000 mg | ORAL_TABLET | Freq: Four times a day (QID) | ORAL | Status: DC | PRN

## 2019-12-18 MED ORDER — ZIPRASIDONE MESYLATE 20 MG IM SOLR
10.0000 mg | Freq: Once | INTRAMUSCULAR | Status: AC
  Administered 2019-12-18: 10 mg via INTRAMUSCULAR

## 2019-12-18 MED ORDER — INSULIN ASPART 100 UNIT/ML ~~LOC~~ SOLN: 0.0000 [IU] | SUBCUTANEOUS | Status: DC

## 2019-12-18 MED ORDER — ACETAMINOPHEN 650 MG RE SUPP: 650.0000 mg | Freq: Four times a day (QID) | RECTAL | Status: DC | PRN

## 2019-12-18 MED ORDER — LACTATED RINGERS IV BOLUS
1000.0000 mL | Freq: Once | INTRAVENOUS | Status: AC
  Administered 2019-12-18: 1000 mL via INTRAVENOUS

## 2019-12-18 MED ORDER — LACTATED RINGERS IV SOLN: INTRAVENOUS | Status: AC

## 2019-12-18 NOTE — Progress Notes (Signed)
[facility]  PROGRESS NOTE  [name] ZOX:[id number] DOB: [birth year] DOA: 12/17/2019 PCP: Tracey Harries, MD Admit date - 12/17/2019   Admitting Physician Eduard Clos, MD  Outpatient Primary MD for the patient is Tracey Harries, MD  LOS - 0 Brief Narrative   Sergio Dominguez is a 68 y.o. year old male with medical history significant for type 2 diabetes, HLD who presented on 12/17/2019 with increased confusion in 24 hours reported by wife.  And was admitted with working diagnosis of altered mental status with fever concerning for encephalitis versus meningitis.  ED course notable for T-max of 101, chest x-ray showing possible infiltrates, CTA chest and abdomen nonacute except for multiple fractures in thoracic and lumbar spine of unclear acuity.  Patient empirically started on IV ceftriaxone, acyclovir, vancomycin, ampicillin, unable to obtain LP in ED due to confusion, unable to obtain fluoroscopic guided LP on 12/23 due to agitation.  Lab work-up notable so far for negative salicylates, acetaminophen, LFTs, ammonia. CK noted to be 1037 on admission.  UDS positive for THC.  CT head nonacute  History provided by wife on 12/23  She left him 730 am to go to work She last spoke with him 4:15 yesterday am He took his son to work with no issues She called around 3 and he didn't answer which is not unusual but he didn't call back. Around 3 pm she went to check on him at the house and found the front door ajar found him lying on the floor upstairs in the bedroom "semi-conscious" She yelled his name and he seemed dazed. For all her questions he answered "I don't know, where am I?" She noticed his underwear had urine on it.  She thinks he fell twice because of how disshelved the downstairs looked   His baseline is independent. No history of seizures. No new medications.  He had been complaining of neck pain for the past month. They thought it was MSK    Subjective  FNAME@  [name] today is more alert this morning able to tell me his name and his wifes  A & P   Fever with increased confusion, concerning for infectious encephalitis.  Unclear etiology, high concern for HSV vs some bacterial meningitis. No localizing source on CT abd/chest. Other workup has been negative so far, concern for potential withdrawal from substance given positive UDS for [facility] (?  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 Elevated to 21. Diffuse airway thickening and secretions on CT chest without evidence of cosolidation or effusion.  -trend blood cultures - on empiric antibiotics  Lactic acidosis, downtrending Peak of 3.1, down to 2.7. BP is normotensive -Continue IVF  Rhabdomyolysis with myoglobinuria in setting of fall -trend CK-uptrending, repeat till downtrending -IVF -Holding home statins  Multiple vertebral fractures of T4-T6 and L1-L3, L5 on CT imaging Still moving extremities without notable deficits on my exam.  Unclear acuity, could related to recent fall. -Continue to monitor  Type 2 diabetes, A1c 7.8% CBGs at goal -Currently n.p.o. until more appropriate for mental status -Monitor CBG sliding scale as  needed   Family Communication  :  Spoke with wife Aram Beecham by phone on 12/23   Code Status :  FULL  Disposition Plan  : Close monitoring mental status, still needs MRI for work-up, continue IV antibiotics given concern for  meningitis/encephalitis  Consults  :  IR, Neurology  Procedures  : EEG, pending LP under fluoroscopy  DVT Prophylaxis  : SCDs  Lab Results  Component Value Date   PLT 161 12/18/2019    Diet :  Diet Order            Diet NPO time specified  Diet effective now               Inpatient Medications Scheduled Meds: . insulin aspart  0-9 Units Subcutaneous Q4H   Continuous Infusions: . acyclovir 670 mg (12/18/19 0605)  . ampicillin (OMNIPEN) IV 2 g (12/18/19 0744)  . cefTRIAXone (ROCEPHIN)  IV 2 g (12/18/19 0538)  . lactated ringers 125 mL/hr at 12/18/19 0631  . vancomycin     PRN Meds:.acetaminophen **OR** acetaminophen, LORazepam, ondansetron **OR** ondansetron (ZOFRAN) IV  Antibiotics  :   Anti-infectives (From admission, onward)   Start     Dose/Rate Route Frequency Ordered Stop   12/18/19 1000  vancomycin (VANCOREADY) IVPB 750 mg/150 mL     750 mg 150 mL/hr over 60 Minutes Intravenous Every 12 hours 12/18/19 0412     12/18/19 0445  acyclovir (ZOVIRAX) 670 mg in dextrose 5 % 100 mL IVPB     10 mg/kg  67 kg 113.4 mL/hr over 60 Minutes Intravenous Every 8 hours 12/18/19 0412     12/18/19 0415  cefTRIAXone (ROCEPHIN) 2 g in sodium chloride 0.9 % 100 mL IVPB     2 g 200 mL/hr over 30 Minutes Intravenous Every 12 hours 12/18/19 0409     12/18/19 0415  ampicillin (OMNIPEN) 2 g in sodium chloride 0.9 % 100 mL IVPB     2 g 300 mL/hr over 20 Minutes Intravenous Every 4 hours 12/18/19 0409     12/18/19 0200  vancomycin (VANCOREADY) IVPB 1250 mg/250 mL     1,250 mg 166.7 mL/hr over 90 Minutes Intravenous  Once 12/18/19 0136 12/18/19 0424   12/17/19 2030  cefTRIAXone (ROCEPHIN) 1 g in sodium chloride 0.9 % 100 mL IVPB     1 g 200 mL/hr over 30 Minutes Intravenous  Once 12/17/19 2028 12/17/19 2151   12/17/19 2030  azithromycin (ZITHROMAX) 500 mg in sodium chloride 0.9 % 250 mL IVPB     500 mg 250 mL/hr over 60 Minutes Intravenous  Once 12/17/19 2028 12/18/19 0000        Objective   Vitals:   12/18/19 0315 12/18/19 0406 12/18/19 0720 12/18/19 0800  BP: 137/76 133/65 110/76 120/70  Pulse:  77 94 81  Resp: (!) 22 (!) Temp:  98 F (36.7 C) 98.5 F (36.9 C) (!) 97.5 F (36.4 C)  TempSrc:  Axillary Axillary Axillary  SpO2:  98% 93% 98%  Weight:        SpO2: 98 %  Wt Readings from Last 3 Encounters:  12/17/19 67 kg    No intake or output data in the 24 hours ending 12/18/19 1610  Physical Exam:  Awake Alert, oriented to self, able to state wife's name. Not oriented to place or time Able to follow simple commands ( wiggle toes, give thumbs up sign, smile). No appreciable focal deficits Dry oral mucosa [place].AT Symmetrical Chest wall movement,  Good air movement bilaterally, CTAB RRR,No Gallops,Rubs or new Murmurs,  +ve B.Sounds, Abd Soft, No tenderness,  No rebound, guarding or rigidity. No Cyanosis, Clubbing or edema, No new Rash or bruise     I have personally reviewed the following:   Data Reviewed:  CBC Recent Labs  Lab 12/17/19 1822 12/17/19 1827 12/18/19 0502  WBC  --  20.9* 21.1*  HGB 15.3 15.3 14.1  HCT 45.0 46.4 42.6  PLT  --  178 161  MCV  --  93.5 92.8  MCH  --  30.8 30.7  MCHC  --  33.0 33.1  RDW  --  14.1 14.2  LYMPHSABS  --  0.3* 1.1  MONOABS  --  0.8 1.3*  EOSABS  --  0.0 0.0  BASOSABS  --  0.0 0.0    Chemistries  Recent Labs  Lab 12/17/19 1822 12/17/19 1827 12/18/19 0502  NA 138 137 139  K 4.2 3.8 4.0  CL 107 104 107  CO2  --  20* 20*  GLUCOSE 208* 217* 152*  BUN 13 10 9   CREATININE 0.70 1.02 0.77  CALCIUM  --  9.0 8.7*  MG  --  1.9 1.8  AST  --  36 59*  ALT  --  33 34  ALKPHOS  --  86 79  BILITOT  --  1.1 1.1   ------------------------------------------------------------------------------------------------------------------ No results for input(s): CHOL, HDL, LDLCALC, TRIG, CHOLHDL, LDLDIRECT in the last 72 hours.  Lab Results  Component Value Date   HGBA1C 7.8 (H)  12/18/2019   ------------------------------------------------------------------------------------------------------------------ Recent Labs    12/18/19 0502  TSH 1.699   ------------------------------------------------------------------------------------------------------------------ No results for input(s): VITAMINB12, FOLATE, FERRITIN, TIBC, IRON, RETICCTPCT in the last 72 hours.  Coagulation profile Recent Labs  Lab 12/17/19 1827  INR 1.2    No results for input(s): DDIMER in the last 72 hours.  Cardiac Enzymes No results for input(s): CKMB, TROPONINI, MYOGLOBIN in the last 168 hours.  Invalid input(s): CK ------------------------------------------------------------------------------------------------------------------ No results found for: BNP  Micro Results Recent Results (from the past 240 hour(s))  Culture, blood (routine x 2)     Status: None (Preliminary result)   Collection Time: 12/17/19  8:28 PM   Specimen: BLOOD  Result Value Ref Range Status   Specimen Description BLOOD RIGHT ARM  Final   Special Requests   Final    BOTTLES DRAWN AEROBIC AND ANAEROBIC Blood Culture results may not be optimal due to an inadequate volume of blood received in culture bottles   Culture   Final    NO GROWTH < 12 HOURS Performed at [facility], [address] [place], [place]    Report Status PENDING  Incomplete  Culture, blood (routine x 2)     Status: None (Preliminary result)   Collection Time: 12/17/19  8:55 PM   Specimen: BLOOD  Result Value Ref Range Status   Specimen Description BLOOD LEFT ARM  Final   Special Requests   Final    BOTTLES DRAWN AEROBIC AND ANAEROBIC Blood Culture results may not be optimal due to an inadequate volume of blood received in culture bottles   Culture   Final    NO GROWTH < 12 HOURS Performed at [facility], [address], [place], [place]    Report Status PENDING  Incomplete  SARS CORONAVIRUS 2 (TAT  6-24 HRS) Nasopharyngeal Nasopharyngeal Swab     Status: None   Collection Time: 12/17/19  9:52 PM  Specimen: Nasopharyngeal Swab  Result Value Ref Range Status   SARS Coronavirus 2 NEGATIVE NEGATIVE Final    Comment: (NOTE) SARS-CoV-2 target nucleic acids are NOT DETECTED. The SARS-CoV-2 RNA is generally detectable in upper and lower respiratory specimens during the acute phase of infection. Negative results do not preclude SARS-CoV-2 infection, do not rule out co-infections with other pathogens, and should not be used as the sole basis for treatment or other patient management decisions. Negative results must be combined with clinical observations, patient history, and epidemiological information. The expected result is Negative. Fact Sheet for Patients: HairSlick.nohttps://www.fda.gov/media/138098/download Fact Sheet for Healthcare Providers: quierodirigir.comhttps://www.fda.gov/media/138095/download This test is not yet approved or cleared by the [place] FDA and  has been authorized for detection and/or diagnosis of SARS-CoV-2 by FDA under an Emergency Use Authorization (EUA). This EUA will remain  in effect (meaning this test can be used) for the duration of the COVID-19 declaration under Section 56 4(b)(1) of the Act, 21 U.S.C. section 360bbb-3(b)(1), unless the authorization is terminated or revoked sooner. Performed at [facility], [address] [place], [place] [address]   MRSA PCR Screening     Status: None   Collection Time: 12/18/19  5:55 AM   Specimen: Nasopharyngeal  Result Value Ref Range Status   MRSA by PCR NEGATIVE NEGATIVE Final    Comment:        The GeneXpert MRSA Assay (FDA approved for NASAL specimens only), is one component of a comprehensive MRSA colonization surveillance program. It is not intended to diagnose MRSA infection nor to guide or monitor treatment for MRSA infections. Performed at [facility], [address] [place], [place] [address]      Radiology Reports CT Head Wo Contrast  Result Date: 12/17/2019 CLINICAL DATA:  Altered mental status. Episode of unresponsiveness today. EXAM: CT HEAD WITHOUT CONTRAST CT CERVICAL SPINE WITHOUT CONTRAST TECHNIQUE: Multidetector CT imaging of the head and cervical spine was performed following the standard protocol without intravenous contrast. Multiplanar CT image reconstructions of the cervical spine were also generated. COMPARISON:  None. FINDINGS: CT HEAD FINDINGS Brain: No evidence of acute infarction, hemorrhage, hydrocephalus, extra-axial collection or mass lesion/mass effect. Vascular: No hyperdense vessel or unexpected calcification. Skull: Intact.  No focal lesion. Sinuses/Orbits: Scattered ethmoid air cell disease is seen. Small mucous retention cyst or polyp right maxillary sinus noted. Other: None. CT CERVICAL SPINE FINDINGS Alignment: Maintained. Skull base and vertebrae: No acute fracture. No primary bone lesion or focal pathologic process. Soft tissues and spinal canal: No prevertebral fluid or swelling. No visible canal hematoma. Disc levels: Very mild loss of disc space height and endplate spurring are seen at C3-4 and C5-6. Upper chest: Emphysematous disease is noted.  Lung apices are clear. Other: None. IMPRESSION: No acute abnormality head or cervical spine. Mild cervical degenerative change. Emphysema. Electronically Signed   By: Drusilla Kannerhomas  Dalessio M.D.   On: 12/17/2019 18:13   CT Angio Chest PE W/Cm &/Or Wo Cm  Result Date: 12/18/2019 CLINICAL DATA:  Altered mental status, unresponsive on EMS arrival, agitation abdominal abscess, infection suspected EXAM: CT ANGIOGRAPHY CHEST CT ABDOMEN AND PELVIS WITH CONTRAST TECHNIQUE: Multidetector CT imaging of the chest was performed using the standard protocol during bolus administration of intravenous contrast. Multiplanar CT image reconstructions and MIPs were obtained to evaluate the vascular anatomy. Multidetector CT imaging of the  abdomen and pelvis was performed using the standard protocol during bolus administration of intravenous contrast. CONTRAST:  100 cc OMNIPAQUE IOHEXOL 350  MG/ML SOLN COMPARISON:  Chest radiograph 12/17/2019 FINDINGS: CTA CHEST FINDINGS Cardiovascular: Satisfactory opacification the pulmonary arteries to the segmental level. No pulmonary artery filling defects are identified. Central pulmonary arteries are normal caliber. The aorta is normal caliber. Normal 3 vessel branching of the aortic arch. Normal heart size. No pericardial effusion. Mediastinum/Nodes: No mediastinal free fluid or air. Scattered secretions seen in the trachea and central airways. No acute abnormality of the esophagus. Thyroid gland and thoracic inlet are unremarkable. No visible mediastinal, hilar or axillary adenopathy. Lungs/Pleura: Centrilobular and paraseptal emphysematous changes are noted diffusely. There is mild diffuse airways thickening and scattered secretions. Atelectatic changes are present dependently. No consolidation, features of edema, pneumothorax, or effusion. No suspicious pulmonary nodules or masses. Musculoskeletal: Age-indeterminate anterior wedging compression deformities at T4, T5 and T6 with approximately 20-30% height loss anteriorly (AOSpine A1). Milder compression deformity suspected at the superior T9 endplate as well with approximately 5% height loss. No extension into the posterior elements or disruption of the posterior tension band. Background of multilevel discogenic and facet degenerative changes in the spine. Review of the MIP images confirms the above findings. CT ABDOMEN and PELVIS FINDINGS Hepatobiliary: 1.3 cm fluid attenuation cyst in the left lobe liver. No worrisome hepatic lesion. Normal gallbladder. No gallbladder wall thickening or pericholecystic fluid. No visible calcified gallstones or biliary ductal dilatation. Pancreas: Unremarkable. No pancreatic ductal dilatation or surrounding inflammatory  changes. Spleen: Normal in size without focal abnormality. Adrenals/Urinary Tract: Normal adrenal glands. 2.2 cm fluid attenuation cysts seen in the anterior upper pole right kidney. No concerning focal renal lesion. No urolithiasis or hydronephrosis. Difficult to assess the ureters given extensive patient motion artifact. Mild circumferential bladder wall thickening is noted. Indentation of the posterior bladder by the borderline enlarged prostate Stomach/Bowel: Bowel assessment is complicated by extensive patient motion artifact throughout the abdomen and pelvis. Distal esophagus, stomach and duodenal sweep are unremarkable. No small bowel wall thickening or dilatation. No evidence of obstruction. Air-filled noninflamed appendix seen at the cecal tip. No colonic dilatation or wall thickening. Vascular/Lymphatic: Atherosclerotic plaque within the normal caliber aorta. Difficult assessment of the lymph nodes in the abdomen and pelvis secondary to a paucity of intraperitoneal fat and patient motion artifact. No grossly enlarged nodes are seen. Reproductive: Mild prostatomegaly with indentation of the bladder base. Seminal vesicles are unremarkable. Other: No free fluid or convincing evidence of free air. Evaluation limited given motion artifact. No bowel containing hernias. Paucity of subcutaneous fat. No body wall hematoma or traumatic abdominal wall injury. Mild body wall edema. Musculoskeletal: There are incomplete burst fractures involving the superior endplates L1, L2 and L3 with approximately 20-40% height loss at these levels (AOSpine A3). Minimal retropulsion of less than 2 mm at L2 and L3. No severe canal stenosis. Additional anterior wedging compression deformity L5 30% height loss maximally (AOSpine A1). No extension into the posterior elements or disruption of the posterior tension band. No other acute osseous injury is identified. Moderate to severe degenerative changes noted in both hips. Review of the  MIP images confirms the above findings. IMPRESSION: 1. Imaging quality is significantly degraded due to patient motion artifact most severe throughout the abdomen and pelvis. 2. No evidence of acute pulmonary embolus. 3. Anterior wedging compression deformities at T4, T5, T6, and L5 (AOSpine A1). Incomplete burst fractures of the superior endplates of L1, L2 and L3 (AOSpine A3). Correlate for point tenderness at the site of multiple vertebral fractures to assess for acuity. No severe resulting canal stenosis or  foraminal narrowing. No extension into the posterior elements or disruption of the posterior tension band. 4. Mild circumferential bladder wall thickening, with prostatomegaly finding may be secondary to chronic outlet obstruction. However recommend correlation with urinalysis to exclude cystitis. 5. Prostatomegaly with indentation of the bladder base. 6.  Aortic Atherosclerosis (ICD10-I70.0). 7.  Emphysema (ICD10-J43.9). These results were called by telephone at the time of interpretation on 12/18/2019 at 1:33 am to provider Dr. Dayna Barker, who verbally acknowledged these results. Electronically Signed   By: Lovena Le M.D.   On: 12/18/2019 01:34   CT Cervical Spine Wo Contrast  Result Date: 12/17/2019 CLINICAL DATA:  Altered mental status. Episode of unresponsiveness today. EXAM: CT HEAD WITHOUT CONTRAST CT CERVICAL SPINE WITHOUT CONTRAST TECHNIQUE: Multidetector CT imaging of the head and cervical spine was performed following the standard protocol without intravenous contrast. Multiplanar CT image reconstructions of the cervical spine were also generated. COMPARISON:  None. FINDINGS: CT HEAD FINDINGS Brain: No evidence of acute infarction, hemorrhage, hydrocephalus, extra-axial collection or mass lesion/mass effect. Vascular: No hyperdense vessel or unexpected calcification. Skull: Intact.  No focal lesion. Sinuses/Orbits: Scattered ethmoid air cell disease is seen. Small mucous retention cyst or polyp  right maxillary sinus noted. Other: None. CT CERVICAL SPINE FINDINGS Alignment: Maintained. Skull base and vertebrae: No acute fracture. No primary bone lesion or focal pathologic process. Soft tissues and spinal canal: No prevertebral fluid or swelling. No visible canal hematoma. Disc levels: Very mild loss of disc space height and endplate spurring are seen at C3-4 and C5-6. Upper chest: Emphysematous disease is noted.  Lung apices are clear. Other: None. IMPRESSION: No acute abnormality head or cervical spine. Mild cervical degenerative change. Emphysema. Electronically Signed   By: Inge Rise M.D.   On: 12/17/2019 18:13   CT Abdomen Pelvis W Contrast  Result Date: 12/18/2019 CLINICAL DATA:  Altered mental status, unresponsive on EMS arrival, agitation abdominal abscess, infection suspected EXAM: CT ANGIOGRAPHY CHEST CT ABDOMEN AND PELVIS WITH CONTRAST TECHNIQUE: Multidetector CT imaging of the chest was performed using the standard protocol during bolus administration of intravenous contrast. Multiplanar CT image reconstructions and MIPs were obtained to evaluate the vascular anatomy. Multidetector CT imaging of the abdomen and pelvis was performed using the standard protocol during bolus administration of intravenous contrast. CONTRAST:  100 cc OMNIPAQUE IOHEXOL 350 MG/ML SOLN COMPARISON:  Chest radiograph 12/17/2019 FINDINGS: CTA CHEST FINDINGS Cardiovascular: Satisfactory opacification the pulmonary arteries to the segmental level. No pulmonary artery filling defects are identified. Central pulmonary arteries are normal caliber. The aorta is normal caliber. Normal 3 vessel branching of the aortic arch. Normal heart size. No pericardial effusion. Mediastinum/Nodes: No mediastinal free fluid or air. Scattered secretions seen in the trachea and central airways. No acute abnormality of the esophagus. Thyroid gland and thoracic inlet are unremarkable. No visible mediastinal, hilar or axillary  adenopathy. Lungs/Pleura: Centrilobular and paraseptal emphysematous changes are noted diffusely. There is mild diffuse airways thickening and scattered secretions. Atelectatic changes are present dependently. No consolidation, features of edema, pneumothorax, or effusion. No suspicious pulmonary nodules or masses. Musculoskeletal: Age-indeterminate anterior wedging compression deformities at T4, T5 and T6 with approximately 20-30% height loss anteriorly (AOSpine A1). Milder compression deformity suspected at the superior T9 endplate as well with approximately 5% height loss. No extension into the posterior elements or disruption of the posterior tension band. Background of multilevel discogenic and facet degenerative changes in the spine. Review of the MIP images confirms the above findings. CT ABDOMEN and PELVIS FINDINGS Hepatobiliary:  1.3 cm fluid attenuation cyst in the left lobe liver. No worrisome hepatic lesion. Normal gallbladder. No gallbladder wall thickening or pericholecystic fluid. No visible calcified gallstones or biliary ductal dilatation. Pancreas: Unremarkable. No pancreatic ductal dilatation or surrounding inflammatory changes. Spleen: Normal in size without focal abnormality. Adrenals/Urinary Tract: Normal adrenal glands. 2.2 cm fluid attenuation cysts seen in the anterior upper pole right kidney. No concerning focal renal lesion. No urolithiasis or hydronephrosis. Difficult to assess the ureters given extensive patient motion artifact. Mild circumferential bladder wall thickening is noted. Indentation of the posterior bladder by the borderline enlarged prostate Stomach/Bowel: Bowel assessment is complicated by extensive patient motion artifact throughout the abdomen and pelvis. Distal esophagus, stomach and duodenal sweep are unremarkable. No small bowel wall thickening or dilatation. No evidence of obstruction. Air-filled noninflamed appendix seen at the cecal tip. No colonic dilatation or wall  thickening. Vascular/Lymphatic: Atherosclerotic plaque within the normal caliber aorta. Difficult assessment of the lymph nodes in the abdomen and pelvis secondary to a paucity of intraperitoneal fat and patient motion artifact. No grossly enlarged nodes are seen. Reproductive: Mild prostatomegaly with indentation of the bladder base. Seminal vesicles are unremarkable. Other: No free fluid or convincing evidence of free air. Evaluation limited given motion artifact. No bowel containing hernias. Paucity of subcutaneous fat. No body wall hematoma or traumatic abdominal wall injury. Mild body wall edema. Musculoskeletal: There are incomplete burst fractures involving the superior endplates L1, L2 and L3 with approximately 20-40% height loss at these levels (AOSpine A3). Minimal retropulsion of less than 2 mm at L2 and L3. No severe canal stenosis. Additional anterior wedging compression deformity L5 30% height loss maximally (AOSpine A1). No extension into the posterior elements or disruption of the posterior tension band. No other acute osseous injury is identified. Moderate to severe degenerative changes noted in both hips. Review of the MIP images confirms the above findings. IMPRESSION: 1. Imaging quality is significantly degraded due to patient motion artifact most severe throughout the abdomen and pelvis. 2. No evidence of acute pulmonary embolus. 3. Anterior wedging compression deformities at T4, T5, T6, and L5 (AOSpine A1). Incomplete burst fractures of the superior endplates of L1, L2 and L3 (AOSpine A3). Correlate for point tenderness at the site of multiple vertebral fractures to assess for acuity. No severe resulting canal stenosis or foraminal narrowing. No extension into the posterior elements or disruption of the posterior tension band. 4. Mild circumferential bladder wall thickening, with prostatomegaly finding may be secondary to chronic outlet obstruction. However recommend correlation with urinalysis  to exclude cystitis. 5. Prostatomegaly with indentation of the bladder base. 6.  Aortic Atherosclerosis (ICD10-I70.0). 7.  Emphysema (ICD10-J43.9). These results were called by telephone at the time of interpretation on 12/18/2019 at 1:33 am to provider Dr. Clayborne Dana, who verbally acknowledged these results. Electronically Signed   By: Kreg Shropshire M.D.   On: 12/18/2019 01:34   DG Chest Port 1 View  Result Date: 12/17/2019 CLINICAL DATA:  Mental status changes EXAM: PORTABLE CHEST 1 VIEW COMPARISON:  Portable exam 1937 hours without priors for comparison FINDINGS: Jewelry artifact projects over LEFT upper lobe. Normal heart size, mediastinal contours, and pulmonary vascularity. Peribronchial thickening with atelectasis versus infiltrate at medial LEFT lower lobe. Prominent first costochondral junctions bilaterally. Remaining lungs clear. No pleural effusion or pneumothorax. Bones demineralized. IMPRESSION: Bronchitic changes with atelectasis versus infiltrate in retrocardiac LEFT lower lobe. Electronically Signed   By: Ulyses Southward M.D.   On: 12/17/2019 19:54     Time  Spent in minutes  30     Laverna Peace M.D on 12/18/2019 at 9:37 AM  To page go to [email or web address] - password [facility]

## 2019-12-18 NOTE — Progress Notes (Signed)
EEG complete - results pending 

## 2019-12-18 NOTE — Consult Note (Signed)
Neurology Consultation  Reason for Consult: AMS Referring Physician: Netty  History is obtained from wife  HPI: Moody Robben is a 68 y.o. male with history of diabetes without complications.  Patient's wife had left the day before on 12/16/2019.  At that time is his baseline.  Patient then spoke to her at approximately t 4:30 in the morning of 12/17/2019 and was normal..  There is also report that he drove his son to work sometime in the morning.  Due to the fact that she continue to try to call him and he is not answering his phone she came back to the house and at that time  she found him extremely confused lying on the floor and at times staggering to the bathroom.  When EMS started to assess him patient became agitated and combative.  Patient was unable to answer any meaningful questions to EMS.  Patient was normotensive without hypoxia and regular heart rate.  No hypoglycemia.  In the ED WBC was found to be 20.9 with majority being neutrophils.  Patient's urinalysis showed elevated glucose but no bacteria.  He was positive for THC.  Total CK was elevated at 1037.  TSH showed to be 0.943.  Covid negative.  LP under fluoroscopy was attempted this morning however due to agitation they could not obtain lumbar puncture and will attempt at another time.  Has been ordered and patient was on empiric antibiotics including vancomycin, ampicillin, ceftriaxone and acyclovir.  Currently he is still amnestic of much of the event.  He is able to recognize his wife and follow commands but not give me a clear history.  Wife in the room was able to give me a good history.  Of note wife states that he does not drink however, she knew that patient had THC in his system but states she was not surprised that it was.  Past Medical History:  Diagnosis Date  . Diabetes mellitus without complication ([place])     Family History  Problem Relation Age of Onset  . Multiple sclerosis Mother    Social History:   reports  that he has never smoked. He has never used smokeless tobacco. No history on file for alcohol and drug.  Medications  Current Facility-Administered Medications:  .  acetaminophen (TYLENOL) tablet 650 mg, 650 mg, Oral, Q6H PRN **OR** acetaminophen (TYLENOL) suppository 650 mg, 650 mg, Rectal, Q6H PRN, Rise Patience, MD .  acyclovir (ZOVIRAX) 670 mg in dextrose 5 % 100 mL IVPB, 10 mg/kg, Intravenous, Q8H, Erenest Blank, RPH, Last Rate: 113.4 mL/hr at 12/18/19 0605, 670 mg at 12/18/19 0605 .  ampicillin (OMNIPEN) 2 g in sodium chloride 0.9 % 100 mL IVPB, 2 g, Intravenous, Q4H, Rise Patience, MD, Last Rate: 300 mL/hr at 12/18/19 1238, 2 g at 12/18/19 1238 .  cefTRIAXone (ROCEPHIN) 2 g in sodium chloride 0.9 % 100 mL IVPB, 2 g, Intravenous, Q12H, Rise Patience, MD, Last Rate: 200 mL/hr at 12/18/19 0538, 2 g at 12/18/19 0538 .  Chlorhexidine Gluconate Cloth 2 % PADS 6 each, 6 each, Topical, Daily, Oretha Milch D, MD, 6 each at 12/18/19 1050 .  insulin aspart (novoLOG) injection 0-9 Units, 0-9 Units, Subcutaneous, Q4H, Rise Patience, MD .  lactated ringers infusion, , Intravenous, Continuous, Rise Patience, MD, Last Rate: 125 mL/hr at 12/18/19 0631, New Bag at 12/18/19 0631 .  LORazepam (ATIVAN) injection 1 mg, 1 mg, Intravenous, Q4H PRN, Rise Patience, MD, 1 mg at 12/18/19 0839 .  ondansetron (ZOFRAN) tablet 4 mg, 4 mg, Oral, Q6H PRN **OR** ondansetron (ZOFRAN) injection 4 mg, 4 mg, Intravenous, Q6H PRN, Eduard ClosKakrakandy, Arshad N, MD .  vancomycin (VANCOREADY) IVPB 750 mg/150 mL, 750 mg, Intravenous, Q12H, Stevphen RochesterLedford, James L, RPH, Last Rate: 150 mL/hr at 12/18/19 1049, 750 mg at 12/18/19 1049   Exam: Current vital signs: BP 120/70 (BP Location: Left Arm)   Pulse 81   Temp (!) 97.5 F (36.4 C) (Axillary)   Resp 15   Wt 67 kg   SpO2 98%  Vital signs in last 24 hours: Temp:  [97.5 F (36.4 C)-101.3 F (38.5 C)] 97.5 F (36.4 C) (12/23 0800) Pulse Rate:   [77-96] 81 (12/23 0800) Resp:  [14-34] 15 (12/23 0800) BP: (110-144)/(63-96) 120/70 (12/23 0800) SpO2:  [93 %-98 %] 98 % (12/23 0800) Weight:  [67 kg] 67 kg (12/22 2200)  ROS:    General ROS: negative for - chills, fatigue, fever, night sweats, weight gain or weight loss Psychological ROS: negative for - behavioral disorder, hallucinations, memory difficulties, mood swings or suicidal ideation Ophthalmic ROS: negative for - blurry vision, double vision, eye pain or loss of vision ENT ROS: negative for - epistaxis, nasal discharge, oral lesions, sore throat, tinnitus or vertigo Respiratory ROS: negative for - cough, hemoptysis, shortness of breath or wheezing Cardiovascular ROS: negative for - chest pain, dyspnea on exertion, Gastrointestinal ROS: negative for - abdominal pain, diarrhea, hematemesis, nausea/vomiting or stool incontinence Genito-Urinary ROS: negative for - dysuria, hematuria, incontinence or urinary frequency/urgency Musculoskeletal ROS: negative for - joint swelling or muscular weakness Neurological ROS: as noted in HPI Dermatological ROS: negative for rash and skin lesion changes   Physical Exam   Constitutional: Thin.  Psych: Very lethargic Eyes: No scleral injection HENT: No OP obstrucion Head: Normocephalic.  Cardiovascular: Normal rate and regular rhythm.  Respiratory: Effort normal, non-labored breathing GI: Soft.  No distension. There is no tenderness.  Skin: WDI with bruises and abrasions on the legs  Neuro: Mental Status: Patient is awake, alert, [facility] and [facility] but is able to tell me the year is 2020, it is December and Christmas is during this month.  He is able to follow commands however it does take him some time to think about what he is about to do  Cranial Nerves: II: Visual Fields are full.  III,IV, VI: EOMI without ptosis or diploplia. Pupils equal, round and reactive to light V: Facial sensation is symmetric to  temperature VII: Facial movement is symmetric.  VIII: hearing is intact to voice X: Palat elevates symmetrically XI: Shoulder shrug is symmetric. XII: tongue is midline without atrophy or fasciculations.  Motor: Tone is normal.  Decreased bulk throughout. 5/5 strength was present in all four extremities.  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  Plantars: Toes are downgoing bilaterally.  Cerebellar: FNF and HKS are intact bilaterally  Labs I have reviewed labs in epic and the results pertinent to this consultation are:  CBC    Component Value Date/Time   WBC 21.1 (H) 12/18/2019 0502   RBC 4.59 12/18/2019 0502   HGB 14.1 12/18/2019 0502   HCT 42.6 12/18/2019 0502   PLT 161 12/18/2019 0502   MCV 92.8 12/18/2019 0502   MCH 30.7 12/18/2019 0502   MCHC 33.1 12/18/2019 0502   RDW 14.2 12/18/2019 0502  LYMPHSABS 1.1 12/18/2019 0502   MONOABS 1.3 (H) 12/18/2019 0502   EOSABS 0.0 12/18/2019 0502   BASOSABS 0.0 12/18/2019 0502    CMP     Component Value Date/Time   NA 139 12/18/2019 0502   K 4.0 12/18/2019 0502   CL 107 12/18/2019 0502   CO2 20 (L) 12/18/2019 0502   GLUCOSE 152 (H) 12/18/2019 0502   BUN 9 12/18/2019 0502   CREATININE 0.77 12/18/2019 0502   CALCIUM 8.7 (L) 12/18/2019 0502   PROT 6.8 12/18/2019 0502   ALBUMIN 3.6 12/18/2019 0502   AST 59 (H) 12/18/2019 0502   ALT 34 12/18/2019 0502   ALKPHOS 79 12/18/2019 0502   BILITOT 1.1 12/18/2019 0502   GFRNONAA >60 12/18/2019 0502   GFRAA >60 12/18/2019 0502    Lipid Panel  No results found for: CHOL, TRIG, HDL, CHOLHDL, VLDL, LDLCALC, LDLDIRECT   Imaging I have reviewed the images obtained:  CT-scan of the brain-no evidence of acute infarction, hemorrhage, hydrocephalus, extra-axial collection or mass lesion/mass-effect.  EEG-this study  was technically difficult suggestive of mild to moderate diffuse encephalopathy, nonspecific etiology.  The excessive beta activity seen in the background is most likely due to the effects of benzodiazepine and is a benign EEG.  No seizures or epileptiform discharges were seen throughout the recording  Ammonia 15 TSH 0.94 Glucose in the 200s now trending down to 124  Felicie Morn PA-C [facility] [phone]  M-F  (9:00 am- 5:00 PM)  12/18/2019, 12:54 PM   I have seen the patient reviewed the above note.  On my exam, he has significantly improved.  He does not remember the events leading to hospitalization.   Assessment:  This is a 69 year old male presenting to the hospital after being found down and confused with no known reason.   LP was attempted however not able to be obtained secondary to agitation.  Patient appears to be improving significantly over the last day as he is now following commands, less agitated and more alert.  Differential at this time includes seizure versus CSF infection versus medication effect. Synthetic cannabinoids can cause a profound serotonin syndrome and I have seen cases where people thought they were consuming regular marijuana that had this without their knowledge.   Recommendations: -We will try to decrease sedating medications if possible with his agitation -Would continue to evaluate for withdrawal symptoms -Would try to obtain LP under fluoroscopy when possible -Would recommend MRI brain without contrast to evaluate for any intracranial abnormalities. - urine for synthetic cannabinoids.   Ritta Slot, MD [facility] [phone]  If 7pm- 7am, please page neurology on call as listed in AMION.

## 2019-12-18 NOTE — H&P (Signed)
History and Physical    Sergio Dominguez ZOX:[id number] DOB: [birth year] DOA: 12/17/2019  PCP: Tracey Harries, MD  Patient coming from: Home.  History obtained from patient's wife.  Chief Complaint: Confusion.  HPI: Sergio Dominguez is a 68 y.o. male with history of diabetes mellitus type 2, hyperlipidemia was found to be confused last evening around 4 PM by patient's wife.  Last seen normal early in the morning.  Per report patient had dropped off his son earlier.  Later in the evening when patient's wife came back patient was found lying on the floor confused and had at least 2 falls.  EMS was called and patient was found to be febrile and confused and was brought to the ER.  ED Course: In the ER patient was agitated requiring sedation.  Patient was febrile with temperature 101 F chest x-ray showing possible infiltrates.  Patient underwent CT angiogram of the chest and CT abdomen pelvis which shows multiple fractures of the thoracic and L-spine involving the T4-T5-T6 and L1-L2-L3.  CT head was unremarkable.  CT C-spine was unremarkable.  Given the fever and confusion lumbar puncture was attempted but was not successful.  Patient was empirically started on antibiotics after blood cultures obtained and admitted for further management.  Covid test was negative.  EKG shows sinus tachycardia.  Creatinine 1 magnesium 1.9 LFTs were normal ammonia was 15 WBC count was 20.9 platelets 178 urine drug screen positive for marijuana.  Patient required Geodon and Ativan in the ER and restraints.  High-sensitivity troponin was 38 lactic acid was 2.8 acetaminophen level and salicylates were negative.  CK was 1037.  Review of Systems: As per HPI, rest all negative.   Past Medical History:  Diagnosis Date  . Diabetes mellitus without complication [facility])     Past Surgical History:  Procedure Laterality Date  . APPENDECTOMY    . ELBOW SURGERY       reports that he has never smoked. He has never used smokeless  tobacco. No history on file for alcohol and drug.  Allergies  Allergen Reactions  . Oxycodone Hcl     REACTION: NAUSEA/VOMITNG    Family History  Problem Relation Age of Onset  . Multiple sclerosis Mother     Prior to Admission medications   Not on File    Physical Exam: Constitutional: Moderately built and nourished. Vitals:   12/18/19 0245 12/18/19 0300 12/18/19 0315 12/18/19 0406  BP: 130/74 132/80 137/76 133/65  Pulse: 83 87  77  Resp: (!) 22 20 (!) 22 (!) 22  Temp:    98 F (36.7 C)  TempSrc:    Axillary  SpO2: 98% 98%  98%  Weight:       Eyes: Anicteric no pallor. ENMT: No discharge from the ears eyes nose or mouth. Neck: No mass felt.  No neck rigidity. Respiratory: No rhonchi or crepitations. Cardiovascular: S1-S2 heard. Abdomen: Soft nontender bowel sounds present. Musculoskeletal: No edema. Skin: No rash. Neurologic: Alert awake but confused does not follow commands moves all extremities. Psychiatric: Appears confused.   Labs on Admission: I have personally reviewed following labs and imaging studies  CBC: Recent Labs  Lab 12/17/19 1822 12/17/19 1827 12/18/19 0502  WBC  --  20.9* 21.1*  NEUTROABS  --  19.6* 18.5*  HGB 15.3 15.3 14.1  HCT 45.0 46.4 42.6  MCV  --  93.5 92.8  PLT  --  178 161   Basic Metabolic Panel: Recent Labs  Lab 12/17/19 1822 12/17/19 1827 12/18/19  0502  NA 138 137 139  K 4.2 3.8 4.0  CL 107 104 107  CO2  --  20* 20*  GLUCOSE 208* 217* 152*  BUN CREATININE 0.70 1.02 0.77  CALCIUM  --  9.0 8.7*  MG  --  1.9 1.8   GFR: CrCl cannot be calculated (Unknown ideal weight.). Liver Function Tests: Recent Labs  Lab 12/17/19 1827 12/18/19 0502  AST 36 59*  ALT 33 34  ALKPHOS 86 79  BILITOT 1.1 1.1  PROT 7.0 6.8  ALBUMIN 3.8 3.6   Recent Labs  Lab 12/17/19 1827  LIPASE 20   Recent Labs  Lab 12/17/19 1730  AMMONIA 15   Coagulation Profile: Recent Labs  Lab 12/17/19 1827  INR 1.2   Cardiac  Enzymes: Recent Labs  Lab 12/17/19 1827  CKTOTAL 1,037*   BNP (last 3 results) No results for input(s): PROBNP in the last 8760 hours. HbA1C: No results for input(s): HGBA1C in the last 72 hours. CBG: Recent Labs  Lab 12/17/19 1731  GLUCAP 200*   Lipid Profile: No results for input(s): CHOL, HDL, LDLCALC, TRIG, CHOLHDL, LDLDIRECT in the last 72 hours. Thyroid Function Tests: Recent Labs    12/17/19 1730  TSH 0.943   Anemia Panel: No results for input(s): VITAMINB12, FOLATE, FERRITIN, TIBC, IRON, RETICCTPCT in the last 72 hours. Urine analysis:    Component Value Date/Time   COLORURINE YELLOW 12/17/2019 2015   APPEARANCEUR TURBID (A) 12/17/2019 2015   LABSPEC 1.017 12/17/2019 2015   PHURINE 5.0 12/17/2019 2015   GLUCOSEU >=500 (A) 12/17/2019 2015   HGBUR LARGE (A) 12/17/2019 2015   BILIRUBINUR NEGATIVE 12/17/2019 2015   KETONESUR 20 (A) 12/17/2019 2015   PROTEINUR 30 (A) 12/17/2019 2015   NITRITE NEGATIVE 12/17/2019 2015   LEUKOCYTESUR NEGATIVE 12/17/2019 2015   Sepsis Labs: (procalcitonin:4,lacticidven:4) ) Recent Results (from the past 240 hour(s))  SARS CORONAVIRUS 2 (TAT 6-24 HRS) Nasopharyngeal Nasopharyngeal Swab     Status: None   Collection Time: 12/17/19  9:52 PM   Specimen: Nasopharyngeal Swab  Result Value Ref Range Status   SARS Coronavirus 2 NEGATIVE NEGATIVE Final    Comment: (NOTE) SARS-CoV-2 target nucleic acids are NOT DETECTED. The SARS-CoV-2 RNA is generally detectable in upper and lower respiratory specimens during the acute phase of infection. Negative results do not preclude SARS-CoV-2 infection, do not rule out co-infections with other pathogens, and should not be used as the sole basis for treatment or other patient management decisions. Negative results must be combined with clinical observations, patient history, and epidemiological information. The expected result is Negative. Fact Sheet for  Patients: HairSlick.no Fact Sheet for Healthcare Providers: quierodirigir.com This test is not yet approved or cleared by the [place] FDA and  has been authorized for detection and/or diagnosis of SARS-CoV-2 by FDA under an Emergency Use Authorization (EUA). This EUA will remain  in effect (meaning this test can be used) for the duration of the COVID-19 declaration under Section 56 4(b)(1) of the Act, 21 U.S.C. section 360bbb-3(b)(1), unless the authorization is terminated or revoked sooner. Performed at [facility], [address], [place], [place] [address]      Radiological Exams on Admission: CT Head Wo Contrast  Result Date: 12/17/2019 CLINICAL DATA:  Altered mental status. Episode of unresponsiveness today. EXAM: CT HEAD WITHOUT CONTRAST CT CERVICAL SPINE WITHOUT CONTRAST TECHNIQUE: Multidetector CT imaging of the head and cervical spine was performed following the standard protocol without intravenous contrast.  Multiplanar CT image reconstructions of the cervical spine were also generated. COMPARISON:  None. FINDINGS: CT HEAD FINDINGS Brain: No evidence of acute infarction, hemorrhage, hydrocephalus, extra-axial collection or mass lesion/mass effect. Vascular: No hyperdense vessel or unexpected calcification. Skull: Intact.  No focal lesion. Sinuses/Orbits: Scattered ethmoid air cell disease is seen. Small mucous retention cyst or polyp right maxillary sinus noted. Other: None. CT CERVICAL SPINE FINDINGS Alignment: Maintained. Skull base and vertebrae: No acute fracture. No primary bone lesion or focal pathologic process. Soft tissues and spinal canal: No prevertebral fluid or swelling. No visible canal hematoma. Disc levels: Very mild loss of disc space height and endplate spurring are seen at C3-4 and C5-6. Upper chest: Emphysematous disease is noted.  Lung apices are clear. Other: None. IMPRESSION: No acute abnormality  head or cervical spine. Mild cervical degenerative change. Emphysema. Electronically Signed   By: Drusilla Kanner M.D.   On: 12/17/2019 18:13   CT Angio Chest PE W/Cm &/Or Wo Cm  Result Date: 12/18/2019 CLINICAL DATA:  Altered mental status, unresponsive on EMS arrival, agitation abdominal abscess, infection suspected EXAM: CT ANGIOGRAPHY CHEST CT ABDOMEN AND PELVIS WITH CONTRAST TECHNIQUE: Multidetector CT imaging of the chest was performed using the standard protocol during bolus administration of intravenous contrast. Multiplanar CT image reconstructions and MIPs were obtained to evaluate the vascular anatomy. Multidetector CT imaging of the abdomen and pelvis was performed using the standard protocol during bolus administration of intravenous contrast. CONTRAST:  100 cc OMNIPAQUE IOHEXOL 350 MG/ML SOLN COMPARISON:  Chest radiograph 12/17/2019 FINDINGS: CTA CHEST FINDINGS Cardiovascular: Satisfactory opacification the pulmonary arteries to the segmental level. No pulmonary artery filling defects are identified. Central pulmonary arteries are normal caliber. The aorta is normal caliber. Normal 3 vessel branching of the aortic arch. Normal heart size. No pericardial effusion. Mediastinum/Nodes: No mediastinal free fluid or air. Scattered secretions seen in the trachea and central airways. No acute abnormality of the esophagus. Thyroid gland and thoracic inlet are unremarkable. No visible mediastinal, hilar or axillary adenopathy. Lungs/Pleura: Centrilobular and paraseptal emphysematous changes are noted diffusely. There is mild diffuse airways thickening and scattered secretions. Atelectatic changes are present dependently. No consolidation, features of edema, pneumothorax, or effusion. No suspicious pulmonary nodules or masses. Musculoskeletal: Age-indeterminate anterior wedging compression deformities at T4, T5 and T6 with approximately 20-30% height loss anteriorly (AOSpine A1). Milder compression  deformity suspected at the superior T9 endplate as well with approximately 5% height loss. No extension into the posterior elements or disruption of the posterior tension band. Background of multilevel discogenic and facet degenerative changes in the spine. Review of the MIP images confirms the above findings. CT ABDOMEN and PELVIS FINDINGS Hepatobiliary: 1.3 cm fluid attenuation cyst in the left lobe liver. No worrisome hepatic lesion. Normal gallbladder. No gallbladder wall thickening or pericholecystic fluid. No visible calcified gallstones or biliary ductal dilatation. Pancreas: Unremarkable. No pancreatic ductal dilatation or surrounding inflammatory changes. Spleen: Normal in size without focal abnormality. Adrenals/Urinary Tract: Normal adrenal glands. 2.2 cm fluid attenuation cysts seen in the anterior upper pole right kidney. No concerning focal renal lesion. No urolithiasis or hydronephrosis. Difficult to assess the ureters given extensive patient motion artifact. Mild circumferential bladder wall thickening is noted. Indentation of the posterior bladder by the borderline enlarged prostate Stomach/Bowel: Bowel assessment is complicated by extensive patient motion artifact throughout the abdomen and pelvis. Distal esophagus, stomach and duodenal sweep are unremarkable. No small bowel wall thickening or dilatation. No evidence of obstruction. Air-filled noninflamed appendix seen at the  cecal tip. No colonic dilatation or wall thickening. Vascular/Lymphatic: Atherosclerotic plaque within the normal caliber aorta. Difficult assessment of the lymph nodes in the abdomen and pelvis secondary to a paucity of intraperitoneal fat and patient motion artifact. No grossly enlarged nodes are seen. Reproductive: Mild prostatomegaly with indentation of the bladder base. Seminal vesicles are unremarkable. Other: No free fluid or convincing evidence of free air. Evaluation limited given motion artifact. No bowel containing  hernias. Paucity of subcutaneous fat. No body wall hematoma or traumatic abdominal wall injury. Mild body wall edema. Musculoskeletal: There are incomplete burst fractures involving the superior endplates L1, L2 and L3 with approximately 20-40% height loss at these levels (AOSpine A3). Minimal retropulsion of less than 2 mm at L2 and L3. No severe canal stenosis. Additional anterior wedging compression deformity L5 30% height loss maximally (AOSpine A1). No extension into the posterior elements or disruption of the posterior tension band. No other acute osseous injury is identified. Moderate to severe degenerative changes noted in both hips. Review of the MIP images confirms the above findings. IMPRESSION: 1. Imaging quality is significantly degraded due to patient motion artifact most severe throughout the abdomen and pelvis. 2. No evidence of acute pulmonary embolus. 3. Anterior wedging compression deformities at T4, T5, T6, and L5 (AOSpine A1). Incomplete burst fractures of the superior endplates of L1, L2 and L3 (AOSpine A3). Correlate for point tenderness at the site of multiple vertebral fractures to assess for acuity. No severe resulting canal stenosis or foraminal narrowing. No extension into the posterior elements or disruption of the posterior tension band. 4. Mild circumferential bladder wall thickening, with prostatomegaly finding may be secondary to chronic outlet obstruction. However recommend correlation with urinalysis to exclude cystitis. 5. Prostatomegaly with indentation of the bladder base. 6.  Aortic Atherosclerosis (ICD10-I70.0). 7.  Emphysema (ICD10-J43.9). These results were called by telephone at the time of interpretation on 12/18/2019 at 1:33 am to provider Dr. Clayborne Dana, who verbally acknowledged these results. Electronically Signed   By: Kreg Shropshire M.D.   On: 12/18/2019 01:34   CT Cervical Spine Wo Contrast  Result Date: 12/17/2019 CLINICAL DATA:  Altered mental status. Episode of  unresponsiveness today. EXAM: CT HEAD WITHOUT CONTRAST CT CERVICAL SPINE WITHOUT CONTRAST TECHNIQUE: Multidetector CT imaging of the head and cervical spine was performed following the standard protocol without intravenous contrast. Multiplanar CT image reconstructions of the cervical spine were also generated. COMPARISON:  None. FINDINGS: CT HEAD FINDINGS Brain: No evidence of acute infarction, hemorrhage, hydrocephalus, extra-axial collection or mass lesion/mass effect. Vascular: No hyperdense vessel or unexpected calcification. Skull: Intact.  No focal lesion. Sinuses/Orbits: Scattered ethmoid air cell disease is seen. Small mucous retention cyst or polyp right maxillary sinus noted. Other: None. CT CERVICAL SPINE FINDINGS Alignment: Maintained. Skull base and vertebrae: No acute fracture. No primary bone lesion or focal pathologic process. Soft tissues and spinal canal: No prevertebral fluid or swelling. No visible canal hematoma. Disc levels: Very mild loss of disc space height and endplate spurring are seen at C3-4 and C5-6. Upper chest: Emphysematous disease is noted.  Lung apices are clear. Other: None. IMPRESSION: No acute abnormality head or cervical spine. Mild cervical degenerative change. Emphysema. Electronically Signed   By: Drusilla Kanner M.D.   On: 12/17/2019 18:13   CT Abdomen Pelvis W Contrast  Result Date: 12/18/2019 CLINICAL DATA:  Altered mental status, unresponsive on EMS arrival, agitation abdominal abscess, infection suspected EXAM: CT ANGIOGRAPHY CHEST CT ABDOMEN AND PELVIS WITH CONTRAST TECHNIQUE: Multidetector CT  imaging of the chest was performed using the standard protocol during bolus administration of intravenous contrast. Multiplanar CT image reconstructions and MIPs were obtained to evaluate the vascular anatomy. Multidetector CT imaging of the abdomen and pelvis was performed using the standard protocol during bolus administration of intravenous contrast. CONTRAST:  100 cc  OMNIPAQUE IOHEXOL 350 MG/ML SOLN COMPARISON:  Chest radiograph 12/17/2019 FINDINGS: CTA CHEST FINDINGS Cardiovascular: Satisfactory opacification the pulmonary arteries to the segmental level. No pulmonary artery filling defects are identified. Central pulmonary arteries are normal caliber. The aorta is normal caliber. Normal 3 vessel branching of the aortic arch. Normal heart size. No pericardial effusion. Mediastinum/Nodes: No mediastinal free fluid or air. Scattered secretions seen in the trachea and central airways. No acute abnormality of the esophagus. Thyroid gland and thoracic inlet are unremarkable. No visible mediastinal, hilar or axillary adenopathy. Lungs/Pleura: Centrilobular and paraseptal emphysematous changes are noted diffusely. There is mild diffuse airways thickening and scattered secretions. Atelectatic changes are present dependently. No consolidation, features of edema, pneumothorax, or effusion. No suspicious pulmonary nodules or masses. Musculoskeletal: Age-indeterminate anterior wedging compression deformities at T4, T5 and T6 with approximately 20-30% height loss anteriorly (AOSpine A1). Milder compression deformity suspected at the superior T9 endplate as well with approximately 5% height loss. No extension into the posterior elements or disruption of the posterior tension band. Background of multilevel discogenic and facet degenerative changes in the spine. Review of the MIP images confirms the above findings. CT ABDOMEN and PELVIS FINDINGS Hepatobiliary: 1.3 cm fluid attenuation cyst in the left lobe liver. No worrisome hepatic lesion. Normal gallbladder. No gallbladder wall thickening or pericholecystic fluid. No visible calcified gallstones or biliary ductal dilatation. Pancreas: Unremarkable. No pancreatic ductal dilatation or surrounding inflammatory changes. Spleen: Normal in size without focal abnormality. Adrenals/Urinary Tract: Normal adrenal glands. 2.2 cm fluid attenuation  cysts seen in the anterior upper pole right kidney. No concerning focal renal lesion. No urolithiasis or hydronephrosis. Difficult to assess the ureters given extensive patient motion artifact. Mild circumferential bladder wall thickening is noted. Indentation of the posterior bladder by the borderline enlarged prostate Stomach/Bowel: Bowel assessment is complicated by extensive patient motion artifact throughout the abdomen and pelvis. Distal esophagus, stomach and duodenal sweep are unremarkable. No small bowel wall thickening or dilatation. No evidence of obstruction. Air-filled noninflamed appendix seen at the cecal tip. No colonic dilatation or wall thickening. Vascular/Lymphatic: Atherosclerotic plaque within the normal caliber aorta. Difficult assessment of the lymph nodes in the abdomen and pelvis secondary to a paucity of intraperitoneal fat and patient motion artifact. No grossly enlarged nodes are seen. Reproductive: Mild prostatomegaly with indentation of the bladder base. Seminal vesicles are unremarkable. Other: No free fluid or convincing evidence of free air. Evaluation limited given motion artifact. No bowel containing hernias. Paucity of subcutaneous fat. No body wall hematoma or traumatic abdominal wall injury. Mild body wall edema. Musculoskeletal: There are incomplete burst fractures involving the superior endplates L1, L2 and L3 with approximately 20-40% height loss at these levels (AOSpine A3). Minimal retropulsion of less than 2 mm at L2 and L3. No severe canal stenosis. Additional anterior wedging compression deformity L5 30% height loss maximally (AOSpine A1). No extension into the posterior elements or disruption of the posterior tension band. No other acute osseous injury is identified. Moderate to severe degenerative changes noted in both hips. Review of the MIP images confirms the above findings. IMPRESSION: 1. Imaging quality is significantly degraded due to patient motion artifact  most severe throughout the abdomen  and pelvis. 2. No evidence of acute pulmonary embolus. 3. Anterior wedging compression deformities at T4, T5, T6, and L5 (AOSpine A1). Incomplete burst fractures of the superior endplates of L1, L2 and L3 (AOSpine A3). Correlate for point tenderness at the site of multiple vertebral fractures to assess for acuity. No severe resulting canal stenosis or foraminal narrowing. No extension into the posterior elements or disruption of the posterior tension band. 4. Mild circumferential bladder wall thickening, with prostatomegaly finding may be secondary to chronic outlet obstruction. However recommend correlation with urinalysis to exclude cystitis. 5. Prostatomegaly with indentation of the bladder base. 6.  Aortic Atherosclerosis (ICD10-I70.0). 7.  Emphysema (ICD10-J43.9). These results were called by telephone at the time of interpretation on 12/18/2019 at 1:33 am to provider Dr. Clayborne DanaMesner, who verbally acknowledged these results. Electronically Signed   By: Kreg ShropshirePrice  DeHay M.D.   On: 12/18/2019 01:34   DG Chest Port 1 View  Result Date: 12/17/2019 CLINICAL DATA:  Mental status changes EXAM: PORTABLE CHEST 1 VIEW COMPARISON:  Portable exam 1937 hours without priors for comparison FINDINGS: Jewelry artifact projects over LEFT upper lobe. Normal heart size, mediastinal contours, and pulmonary vascularity. Peribronchial thickening with atelectasis versus infiltrate at medial LEFT lower lobe. Prominent first costochondral junctions bilaterally. Remaining lungs clear. No pleural effusion or pneumothorax. Bones demineralized. IMPRESSION: Bronchitic changes with atelectasis versus infiltrate in retrocardiac LEFT lower lobe. Electronically Signed   By: Ulyses SouthwardMark  Boles M.D.   On: 12/17/2019 19:54    EKG: Independently reviewed.  Sinus tachycardia.  Assessment/Plan Principal Problem:   Acute encephalopathy Active Problems:   Fever   Compressed spine fracture (HCC)   Hyperglycemia    Diabetes mellitus type 2 in nonobese (HCC)   HLD (hyperlipidemia)    1. Acute encephalopathy with fever concerning for infectious process for which a lumbar puncture was attempted but was unsuccessful.  Keep patient on empiric antibiotics including vancomycin and ampicillin ceftriaxone and acyclovir.  Consult for fluoroscopic guided lumbar puncture.  Continue on as needed Ativan for agitation.  Presently on soft restraints.  Follow cultures. 2. Diabetes mellitus type 2 we will keep patient on sliding scale coverage. 3. Hyperlipidemia on statins which will be on hold for now given that patient has rhabdomyolysis. 4. Mild rhabdomyolysis could be from fall.  Hold statin.  Continue hydration follow CK levels. 5. T4-5-6 and L1-2-3 fractures seen in the CAT scan which are not sure if is acute chronic.  Will need MRI to reassess.  Can be done only when patient is more cooperative.  Given that patient has acute encephalopathy with fever and agitation will need further work-up and close monitoring for any further deterioration and will need more than 2 midnight stay in inpatient status.   DVT prophylaxis: SCDs in anticipation of procedure. Code Status: Full code. Family Communication: Patient's wife. Disposition Plan: Back to home when stable. Consults called: None. Admission status: Inpatient.   Eduard ClosArshad N Areya Lemmerman MD [facility] Pager [phone].  If 7PM-7AM, please contact night-coverage [email or web address] Password [email or web address]  12/18/2019, 7:07 AM

## 2019-12-18 NOTE — Progress Notes (Signed)
Pharmacy Antibiotic Note  Sergio Dominguez is a 68 y.o. male admitted on 12/17/2019 with altered mental status.  Pharmacy has been consulted for Vancomycin/Acyclovir dosing for r/o meningitis. WBC is elevated. Patient has had significant improvement from admission in mental status. Appears unable to obtain LP due to agitation of patient possible to try again.    Height: 6\' 2"  (188 cm) Weight: 147 lb 11.3 oz (67 kg) IBW/kg (Calculated) : 82.2  Temp (24hrs), Avg:98.8 F (37.1 C), Min:97.5 F (36.4 C), Max:101.3 F (38.5 C)  Recent Labs  Lab 12/17/19 1730 12/17/19 1822 12/17/19 1827 12/17/19 2034 12/18/19 0502 12/18/19 0847  WBC  --   --  20.9*  --  21.1*  --   CREATININE  --  0.70 1.02  --  0.77  --   LATICACIDVEN 2.8*  --   --  2.8* 3.1* 2.7*    Estimated Creatinine Clearance: 82.6 mL/min (by C-G formula based on SCr of 0.77 mg/dL).    Allergies  Allergen Reactions  . Oxycodone Hcl     REACTION: NAUSEA/VOMITNG    Antimicrobials this admission: 12/22 Amp >>  12/22 Ctx >>  12/22 Acyclovir >> 12/22 Vancomycin>>  Dose adjustments this admission:   Microbiology results: 12/22 BCx: NG x 12 hr  12/22 UCx: Pending   Plan:  - Continue Acyclovir 670mg  IV q8h  - Continue Vancomycin 750 mg IV q12h   - First maintenance dose of Vancomycin was given ~4 hours early (monitor patient's renal function and urine output)  - Will obtain a Vancomycin trough on 12/24 @ 1030, prior to the 4th dose with a goal trough of 15-20  - Continue to monitor patient's renal function and LP to adjust abx regimen  Thank you for allowing pharmacy to be a part of this patient's care.  Duanne Limerick PharmD. BCPS  12/18/2019 2:17 PM

## 2019-12-18 NOTE — ED Notes (Signed)
ED TO INPATIENT HANDOFF REPORT  ED Nurse Name and Phone #: Signa Kell Name/Age/Gender Bernette Mayers 68 y.o. male Room/Bed: 033C/033C  Code Status   Code Status: Not on file  Home/SNF/Other Home Disoriented Is this baseline? Yes   Triage Complete: Triage complete  Chief Complaint Acute encephalopathy [G93.40]  Triage Note Pt bib gcems w/ c/o AMS. LKN 0430. Pt unresponsive when EMS arrived, woke up with EMS and extremely agitated. Unable to follow commands or answer questions.     Allergies Allergies  Allergen Reactions  . Oxycodone Hcl     REACTION: NAUSEA/VOMITNG    Level of Care/Admitting Diagnosis ED Disposition    ED Disposition Condition [facility]: [facility] [[id number]]  Level of Care: Progressive [102]  Admit to Progressive based on following criteria: MULTISYSTEM THREATS such as stable sepsis, metabolic/electrolyte imbalance with or without encephalopathy that is responding to early treatment.  Covid Evaluation: Asymptomatic Screening Protocol (No Symptoms)  Diagnosis: Acute encephalopathy [128786]  Admitting Physician: Rise Patience [phone]  Attending Physician: Rise Patience [phone]  Estimated length of stay: past midnight tomorrow  Certification:: I certify this patient will need inpatient services for at least 2 midnights       B Medical/Surgery History History reviewed. No pertinent past medical history. History reviewed. No pertinent surgical history.   A IV Location/Drains/Wounds Patient Lines/Drains/Airways Status   Active Line/Drains/Airways    Name:   Placement date:   Placement time:   Site:   Days:   Peripheral IV 12/17/19 Left Antecubital   12/17/19    2024    Antecubital   1   Peripheral IV 12/18/19 Right Arm   12/18/19    0000    Arm   less than 1          Intake/Output Last 24 hours No intake or output data in the 24 hours ending 12/18/19 0305  Labs/Imaging Results for  orders placed or performed during the hospital encounter of 12/17/19 (from the past 48 hour(s))  Lactic acid, plasma     Status: Abnormal   Collection Time: 12/17/19  5:30 PM  Result Value Ref Range   Lactic Acid, Venous 2.8 (HH) 0.5 - 1.9 mmol/L    Comment: CRITICAL RESULT CALLED TO, READ BACK BY AND VERIFIED WITH:  Jonny Ruiz 12/17/2019 WBOND Performed at [facility], [place] [address] [place], [place] [address]   TSH     Status: None   Collection Time: 12/17/19  5:30 PM  Result Value Ref Range   TSH 0.943 0.350 - 4.500 uIU/mL    Comment: Performed by a 3rd Generation assay with a functional sensitivity of <=0.01 uIU/mL. Performed at [facility], [place] [address], [place], [place] [address]   Ammonia     Status: None   Collection Time: 12/17/19  5:30 PM  Result Value Ref Range   Ammonia 15 9 - 35 umol/L    Comment: Performed at [facility], [place] [address], [place], [place] [address]  CBG monitoring, ED     Status: Abnormal   Collection Time: 12/17/19  5:31 PM  Result Value Ref Range   Glucose-Capillary 200 (H) 70 - 99 mg/dL  I-stat chem 8, ED (not at [facility] or [facility])     Status: Abnormal   Collection Time: 12/17/19  6:22 PM  Result Value Ref Range   Sodium 138 135 - 145 mmol/L   Potassium 4.2  3.5 - 5.1 mmol/L   Chloride 107 98 - 111 mmol/L   BUN 13 8 - 23 mg/dL   Creatinine, Ser 0.70 0.61 - 1.24 mg/dL   Glucose, Bld 208 (H) 70 - 99 mg/dL   Calcium, Ion 1.03 (L) 1.15 - 1.40 mmol/L   TCO2 24 22 - 32 mmol/L   Hemoglobin 15.3 13.0 - 17.0 g/dL   HCT 45.0 39.0 - 52.0 %  Comprehensive metabolic panel     Status: Abnormal   Collection Time: 12/17/19  6:27 PM  Result Value Ref Range   Sodium 137 135 - 145 mmol/L   Potassium 3.8 3.5 - 5.1 mmol/L   Chloride 104 98 - 111 mmol/L   CO2 20 (L) 22 - 32 mmol/L   Glucose, Bld 217 (H) 70 - 99 mg/dL   BUN 10 8 - 23 mg/dL   Creatinine, Ser 1.02 0.61 - 1.24 mg/dL   Calcium 9.0 8.9 - 10.3 mg/dL   Total Protein 7.0 6.5 -  8.1 g/dL   Albumin 3.8 3.5 - 5.0 g/dL   AST 36 15 - 41 U/L   ALT 33 0 - 44 U/L   Alkaline Phosphatase 86 38 - 126 U/L   Total Bilirubin 1.1 0.3 - 1.2 mg/dL   GFR calc non Af Amer >60 >60 mL/min   GFR calc Af Amer >60 >60 mL/min   Anion gap 13 5 - 15    Comment: Performed at [facility], [address] [place], [place] [address]  Lipase, blood     Status: None   Collection Time: 12/17/19  6:27 PM  Result Value Ref Range   Lipase 20 11 - 51 U/L    Comment: Performed at [facility], [place] [address], [place], [place] [address]  Troponin I (High Sensitivity)     Status: Abnormal   Collection Time: 12/17/19  6:27 PM  Result Value Ref Range   Troponin I (High Sensitivity) 31 (H) <18 ng/L    Comment: (NOTE) Elevated high sensitivity troponin I (hsTnI) values and significant  changes across serial measurements may suggest ACS but many other  chronic and acute conditions are known to elevate hsTnI results.  Refer to the "Links" section for chest pain algorithms and additional  guidance. Performed at [facility], [place] [address], [place], [place] [address]   CBC with Differential     Status: Abnormal   Collection Time: 12/17/19  6:27 PM  Result Value Ref Range   WBC 20.9 (H) 4.0 - 10.5 K/uL   RBC 4.96 4.22 - 5.81 MIL/uL   Hemoglobin 15.3 13.0 - 17.0 g/dL   HCT 46.4 39.0 - 52.0 %   MCV 93.5 80.0 - 100.0 fL   MCH 30.8 26.0 - 34.0 pg   MCHC 33.0 30.0 - 36.0 g/dL   RDW 14.1 11.5 - 15.5 %   Platelets 178 150 - 400 K/uL   nRBC 0.0 0.0 - 0.2 %   Neutrophils Relative % 94 %   Neutro Abs 19.6 (H) 1.7 - 7.7 K/uL   Lymphocytes Relative 1 %   Lymphs Abs 0.3 (L) 0.7 - 4.0 K/uL   Monocytes Relative 4 %   Monocytes Absolute 0.8 0.1 - 1.0 K/uL   Eosinophils Relative 0 %   Eosinophils Absolute 0.0 0.0 - 0.5 K/uL   Basophils Relative 0 %   Basophils Absolute 0.0 0.0 - 0.1 K/uL   Immature Granulocytes 1 %   Abs Immature Granulocytes 0.17 (H)  0.00 - 0.07 K/uL    Comment:  Performed at [facility], [place] [address], [place], [place] [address]  Protime-INR     Status: Abnormal   Collection Time: 12/17/19  6:27 PM  Result Value Ref Range   Prothrombin Time 15.3 (H) 11.4 - 15.2 seconds   INR 1.2 0.8 - 1.2    Comment: (NOTE) INR goal varies based on device and disease states. Performed at [facility], [place] [address], [place], [place] [address]   Magnesium     Status: None   Collection Time: 12/17/19  6:27 PM  Result Value Ref Range   Magnesium 1.9 1.7 - 2.4 mg/dL    Comment: Performed at [facility], [place] [address], [place], [place] [address]  CK     Status: Abnormal   Collection Time: 12/17/19  6:27 PM  Result Value Ref Range   Total CK 1,037 (H) 49 - 397 U/L    Comment: Performed at [facility], [place] [address] [place], [place] [address]  Ethanol     Status: None   Collection Time: 12/17/19  7:17 PM  Result Value Ref Range   Alcohol, Ethyl (B) <10 <10 mg/dL    Comment: (NOTE) Lowest detectable limit for serum alcohol is 10 mg/dL. For medical purposes only. Performed at [facility], [place] [address], [place], [place] [address]   Acetaminophen level     Status: Abnormal   Collection Time: 12/17/19  7:17 PM  Result Value Ref Range   Acetaminophen (Tylenol), Serum <10 (L) 10 - 30 ug/mL    Comment: (NOTE) Therapeutic concentrations vary significantly. A range of 10-30 ug/mL  may be an effective concentration for many patients. However, some  are best treated at concentrations outside of this range. Acetaminophen concentrations >150 ug/mL at 4 hours after ingestion  and >50 ug/mL at 12 hours after ingestion are often associated with  toxic reactions. Performed at [facility], [place] [address], [place], [place] [address]   Salicylate level     Status: Abnormal   Collection Time: 12/17/19  7:17 PM  Result Value Ref Range   Salicylate Lvl <6.2 (L) 7.0 - 30.0 mg/dL    Comment: Performed at [facility] [address], [place], [place] [address]  POC SARS Coronavirus 2 Ag-ED - Nasal Swab (BD Veritor Kit)     Status: None   Collection Time: 12/17/19  7:23 PM  Result Value Ref Range   SARS Coronavirus 2 Ag NEGATIVE NEGATIVE    Comment: (NOTE) SARS-CoV-2 antigen NOT DETECTED.  Negative results are presumptive.  Negative results do not preclude SARS-CoV-2 infection and should not be used as the sole basis for treatment or other patient management decisions, including infection  control decisions, particularly in the presence of clinical signs and  symptoms consistent with COVID-19, or in those who have been in contact with the virus.  Negative results must be combined with clinical observations, patient history, and epidemiological information. The expected result is Negative. Fact Sheet for Patients: PodPark.tn Fact Sheet for Healthcare Providers: GiftContent.is This test is not yet approved or cleared by the [place] FDA and  has been authorized for detection and/or diagnosis of SARS-CoV-2 by FDA under an Emergency Use Authorization (EUA).  This EUA will remain in effect (meaning this test can be used) for the duration of  the COVID-19 de claration under Section 564(b)(1) of the Act, 21 U.S.C. section 360bbb-3(b)(1),  unless the authorization is terminated or revoked sooner.   Urinalysis, Routine w reflex microscopic     Status: Abnormal   Collection Time: 12/17/19  8:15 PM  Result Value Ref Range   Color, Urine YELLOW YELLOW   APPearance TURBID (A) CLEAR   Specific Gravity, Urine 1.017 1.005 - 1.030   pH 5.0 5.0 - 8.0   Glucose, UA >=500 (A) NEGATIVE mg/dL   Hgb urine dipstick LARGE (A) NEGATIVE   Bilirubin Urine NEGATIVE NEGATIVE   Ketones, ur 20 (A) NEGATIVE mg/dL   Protein, ur 30 (A) NEGATIVE mg/dL   Nitrite NEGATIVE NEGATIVE   Leukocytes,Ua NEGATIVE NEGATIVE   RBC / HPF 21-50 0 - 5 RBC/hpf   Bacteria, UA RARE  (A) NONE SEEN   Squamous Epithelial / LPF 0-5 0 - 5   Mucus PRESENT    Uric Acid Crys, UA PRESENT     Comment: Performed at [facility], [address] [place], [place] [address]  Urine rapid drug screen (hosp performed)     Status: Abnormal   Collection Time: 12/17/19  8:15 PM  Result Value Ref Range   Opiates NONE DETECTED NONE DETECTED   Cocaine NONE DETECTED NONE DETECTED   Benzodiazepines NONE DETECTED NONE DETECTED   Amphetamines NONE DETECTED NONE DETECTED   Tetrahydrocannabinol POSITIVE (A) NONE DETECTED   Barbiturates NONE DETECTED NONE DETECTED    Comment: (NOTE) DRUG SCREEN FOR MEDICAL PURPOSES ONLY.  IF CONFIRMATION IS NEEDED FOR ANY PURPOSE, NOTIFY LAB WITHIN 5 DAYS. LOWEST DETECTABLE LIMITS FOR URINE DRUG SCREEN Drug Class                     Cutoff (ng/mL) Amphetamine and metabolites    1000 Barbiturate and metabolites    200 Benzodiazepine                 086 Tricyclics and metabolites     300 Opiates and metabolites        300 Cocaine and metabolites        300 THC                            50 Performed at [facility], [place] [address] [place], [place] [address]   Lactic acid, plasma     Status: Abnormal   Collection Time: 12/17/19  8:34 PM  Result Value Ref Range   Lactic Acid, Venous 2.8 (HH) 0.5 - 1.9 mmol/L    Comment: CRITICAL VALUE NOTED.  VALUE IS CONSISTENT WITH PREVIOUSLY REPORTED AND CALLED VALUE. Performed at [facility], [place] [address] [place], [place] [address]   Troponin I (High Sensitivity)     Status: Abnormal   Collection Time: 12/17/19  8:35 PM  Result Value Ref Range   Troponin I (High Sensitivity) 38 (H) <18 ng/L    Comment: (NOTE) Elevated high sensitivity troponin I (hsTnI) values and significant  changes across serial measurements may suggest ACS but many other  chronic and acute conditions are known to elevate hsTnI results.  Refer to the "Links" section for chest pain algorithms and additional   guidance. Performed at [facility], [place] [address] [place], [place] [address]   SARS CORONAVIRUS 2 (TAT 6-24 HRS) Nasopharyngeal Nasopharyngeal Swab     Status: None   Collection Time: 12/17/19  9:52 PM   Specimen: Nasopharyngeal Swab  Result Value Ref Range   SARS Coronavirus 2  NEGATIVE NEGATIVE    Comment: (NOTE) SARS-CoV-2 target nucleic acids are NOT DETECTED. The SARS-CoV-2 RNA is generally detectable in upper and lower respiratory specimens during the acute phase of infection. Negative results do not preclude SARS-CoV-2 infection, do not rule out co-infections with other pathogens, and should not be used as the sole basis for treatment or other patient management decisions. Negative results must be combined with clinical observations, patient history, and epidemiological information. The expected result is Negative. Fact Sheet for Patients: SugarRoll.be Fact Sheet for Healthcare Providers: https://www.woods-mathews.com/ This test is not yet approved or cleared by the [place] FDA and  has been authorized for detection and/or diagnosis of SARS-CoV-2 by FDA under an Emergency Use Authorization (EUA). This EUA will remain  in effect (meaning this test can be used) for the duration of the COVID-19 declaration under Section 56 4(b)(1) of the Act, 21 U.S.C. section 360bbb-3(b)(1), unless the authorization is terminated or revoked sooner. Performed at [facility], [place] [address], [place], [place] [address]    CT Head Wo Contrast  Result Date: 12/17/2019 CLINICAL DATA:  Altered mental status. Episode of unresponsiveness today. EXAM: CT HEAD WITHOUT CONTRAST CT CERVICAL SPINE WITHOUT CONTRAST TECHNIQUE: Multidetector CT imaging of the head and cervical spine was performed following the standard protocol without intravenous contrast. Multiplanar CT image reconstructions of the cervical spine were also generated.  COMPARISON:  None. FINDINGS: CT HEAD FINDINGS Brain: No evidence of acute infarction, hemorrhage, hydrocephalus, extra-axial collection or mass lesion/mass effect. Vascular: No hyperdense vessel or unexpected calcification. Skull: Intact.  No focal lesion. Sinuses/Orbits: Scattered ethmoid air cell disease is seen. Small mucous retention cyst or polyp right maxillary sinus noted. Other: None. CT CERVICAL SPINE FINDINGS Alignment: Maintained. Skull base and vertebrae: No acute fracture. No primary bone lesion or focal pathologic process. Soft tissues and spinal canal: No prevertebral fluid or swelling. No visible canal hematoma. Disc levels: Very mild loss of disc space height and endplate spurring are seen at C3-4 and C5-6. Upper chest: Emphysematous disease is noted.  Lung apices are clear. Other: None. IMPRESSION: No acute abnormality head or cervical spine. Mild cervical degenerative change. Emphysema. Electronically Signed   By: Inge Rise M.D.   On: 12/17/2019 18:13   CT Angio Chest PE W/Cm &/Or Wo Cm  Result Date: 12/18/2019 CLINICAL DATA:  Altered mental status, unresponsive on EMS arrival, agitation abdominal abscess, infection suspected EXAM: CT ANGIOGRAPHY CHEST CT ABDOMEN AND PELVIS WITH CONTRAST TECHNIQUE: Multidetector CT imaging of the chest was performed using the standard protocol during bolus administration of intravenous contrast. Multiplanar CT image reconstructions and MIPs were obtained to evaluate the vascular anatomy. Multidetector CT imaging of the abdomen and pelvis was performed using the standard protocol during bolus administration of intravenous contrast. CONTRAST:  100 cc OMNIPAQUE IOHEXOL 350 MG/ML SOLN COMPARISON:  Chest radiograph 12/17/2019 FINDINGS: CTA CHEST FINDINGS Cardiovascular: Satisfactory opacification the pulmonary arteries to the segmental level. No pulmonary artery filling defects are identified. Central pulmonary arteries are normal caliber. The aorta is  normal caliber. Normal 3 vessel branching of the aortic arch. Normal heart size. No pericardial effusion. Mediastinum/Nodes: No mediastinal free fluid or air. Scattered secretions seen in the trachea and central airways. No acute abnormality of the esophagus. Thyroid gland and thoracic inlet are unremarkable. No visible mediastinal, hilar or axillary adenopathy. Lungs/Pleura: Centrilobular and paraseptal emphysematous changes are noted diffusely. There is mild diffuse airways thickening and scattered secretions. Atelectatic changes are present dependently. No consolidation, features of  edema, pneumothorax, or effusion. No suspicious pulmonary nodules or masses. Musculoskeletal: Age-indeterminate anterior wedging compression deformities at T4, T5 and T6 with approximately 20-30% height loss anteriorly (AOSpine A1). Milder compression deformity suspected at the superior T9 endplate as well with approximately 5% height loss. No extension into the posterior elements or disruption of the posterior tension band. Background of multilevel discogenic and facet degenerative changes in the spine. Review of the MIP images confirms the above findings. CT ABDOMEN and PELVIS FINDINGS Hepatobiliary: 1.3 cm fluid attenuation cyst in the left lobe liver. No worrisome hepatic lesion. Normal gallbladder. No gallbladder wall thickening or pericholecystic fluid. No visible calcified gallstones or biliary ductal dilatation. Pancreas: Unremarkable. No pancreatic ductal dilatation or surrounding inflammatory changes. Spleen: Normal in size without focal abnormality. Adrenals/Urinary Tract: Normal adrenal glands. 2.2 cm fluid attenuation cysts seen in the anterior upper pole right kidney. No concerning focal renal lesion. No urolithiasis or hydronephrosis. Difficult to assess the ureters given extensive patient motion artifact. Mild circumferential bladder wall thickening is noted. Indentation of the posterior bladder by the borderline  enlarged prostate Stomach/Bowel: Bowel assessment is complicated by extensive patient motion artifact throughout the abdomen and pelvis. Distal esophagus, stomach and duodenal sweep are unremarkable. No small bowel wall thickening or dilatation. No evidence of obstruction. Air-filled noninflamed appendix seen at the cecal tip. No colonic dilatation or wall thickening. Vascular/Lymphatic: Atherosclerotic plaque within the normal caliber aorta. Difficult assessment of the lymph nodes in the abdomen and pelvis secondary to a paucity of intraperitoneal fat and patient motion artifact. No grossly enlarged nodes are seen. Reproductive: Mild prostatomegaly with indentation of the bladder base. Seminal vesicles are unremarkable. Other: No free fluid or convincing evidence of free air. Evaluation limited given motion artifact. No bowel containing hernias. Paucity of subcutaneous fat. No body wall hematoma or traumatic abdominal wall injury. Mild body wall edema. Musculoskeletal: There are incomplete burst fractures involving the superior endplates L1, L2 and L3 with approximately 20-40% height loss at these levels (AOSpine A3). Minimal retropulsion of less than 2 mm at L2 and L3. No severe canal stenosis. Additional anterior wedging compression deformity L5 30% height loss maximally (AOSpine A1). No extension into the posterior elements or disruption of the posterior tension band. No other acute osseous injury is identified. Moderate to severe degenerative changes noted in both hips. Review of the MIP images confirms the above findings. IMPRESSION: 1. Imaging quality is significantly degraded due to patient motion artifact most severe throughout the abdomen and pelvis. 2. No evidence of acute pulmonary embolus. 3. Anterior wedging compression deformities at T4, T5, T6, and L5 (AOSpine A1). Incomplete burst fractures of the superior endplates of L1, L2 and L3 (AOSpine A3). Correlate for point tenderness at the site of  multiple vertebral fractures to assess for acuity. No severe resulting canal stenosis or foraminal narrowing. No extension into the posterior elements or disruption of the posterior tension band. 4. Mild circumferential bladder wall thickening, with prostatomegaly finding may be secondary to chronic outlet obstruction. However recommend correlation with urinalysis to exclude cystitis. 5. Prostatomegaly with indentation of the bladder base. 6.  Aortic Atherosclerosis (ICD10-I70.0). 7.  Emphysema (ICD10-J43.9). These results were called by telephone at the time of interpretation on 12/18/2019 at 1:33 am to provider Dr. Dayna Barker, who verbally acknowledged these results. Electronically Signed   By: Lovena Le M.D.   On: 12/18/2019 01:34   CT Cervical Spine Wo Contrast  Result Date: 12/17/2019 CLINICAL DATA:  Altered mental status. Episode of unresponsiveness today.  EXAM: CT HEAD WITHOUT CONTRAST CT CERVICAL SPINE WITHOUT CONTRAST TECHNIQUE: Multidetector CT imaging of the head and cervical spine was performed following the standard protocol without intravenous contrast. Multiplanar CT image reconstructions of the cervical spine were also generated. COMPARISON:  None. FINDINGS: CT HEAD FINDINGS Brain: No evidence of acute infarction, hemorrhage, hydrocephalus, extra-axial collection or mass lesion/mass effect. Vascular: No hyperdense vessel or unexpected calcification. Skull: Intact.  No focal lesion. Sinuses/Orbits: Scattered ethmoid air cell disease is seen. Small mucous retention cyst or polyp right maxillary sinus noted. Other: None. CT CERVICAL SPINE FINDINGS Alignment: Maintained. Skull base and vertebrae: No acute fracture. No primary bone lesion or focal pathologic process. Soft tissues and spinal canal: No prevertebral fluid or swelling. No visible canal hematoma. Disc levels: Very mild loss of disc space height and endplate spurring are seen at C3-4 and C5-6. Upper chest: Emphysematous disease is noted.   Lung apices are clear. Other: None. IMPRESSION: No acute abnormality head or cervical spine. Mild cervical degenerative change. Emphysema. Electronically Signed   By: Inge Rise M.D.   On: 12/17/2019 18:13   CT Abdomen Pelvis W Contrast  Result Date: 12/18/2019 CLINICAL DATA:  Altered mental status, unresponsive on EMS arrival, agitation abdominal abscess, infection suspected EXAM: CT ANGIOGRAPHY CHEST CT ABDOMEN AND PELVIS WITH CONTRAST TECHNIQUE: Multidetector CT imaging of the chest was performed using the standard protocol during bolus administration of intravenous contrast. Multiplanar CT image reconstructions and MIPs were obtained to evaluate the vascular anatomy. Multidetector CT imaging of the abdomen and pelvis was performed using the standard protocol during bolus administration of intravenous contrast. CONTRAST:  100 cc OMNIPAQUE IOHEXOL 350 MG/ML SOLN COMPARISON:  Chest radiograph 12/17/2019 FINDINGS: CTA CHEST FINDINGS Cardiovascular: Satisfactory opacification the pulmonary arteries to the segmental level. No pulmonary artery filling defects are identified. Central pulmonary arteries are normal caliber. The aorta is normal caliber. Normal 3 vessel branching of the aortic arch. Normal heart size. No pericardial effusion. Mediastinum/Nodes: No mediastinal free fluid or air. Scattered secretions seen in the trachea and central airways. No acute abnormality of the esophagus. Thyroid gland and thoracic inlet are unremarkable. No visible mediastinal, hilar or axillary adenopathy. Lungs/Pleura: Centrilobular and paraseptal emphysematous changes are noted diffusely. There is mild diffuse airways thickening and scattered secretions. Atelectatic changes are present dependently. No consolidation, features of edema, pneumothorax, or effusion. No suspicious pulmonary nodules or masses. Musculoskeletal: Age-indeterminate anterior wedging compression deformities at T4, T5 and T6 with approximately 20-30%  height loss anteriorly (AOSpine A1). Milder compression deformity suspected at the superior T9 endplate as well with approximately 5% height loss. No extension into the posterior elements or disruption of the posterior tension band. Background of multilevel discogenic and facet degenerative changes in the spine. Review of the MIP images confirms the above findings. CT ABDOMEN and PELVIS FINDINGS Hepatobiliary: 1.3 cm fluid attenuation cyst in the left lobe liver. No worrisome hepatic lesion. Normal gallbladder. No gallbladder wall thickening or pericholecystic fluid. No visible calcified gallstones or biliary ductal dilatation. Pancreas: Unremarkable. No pancreatic ductal dilatation or surrounding inflammatory changes. Spleen: Normal in size without focal abnormality. Adrenals/Urinary Tract: Normal adrenal glands. 2.2 cm fluid attenuation cysts seen in the anterior upper pole right kidney. No concerning focal renal lesion. No urolithiasis or hydronephrosis. Difficult to assess the ureters given extensive patient motion artifact. Mild circumferential bladder wall thickening is noted. Indentation of the posterior bladder by the borderline enlarged prostate Stomach/Bowel: Bowel assessment is complicated by extensive patient motion artifact throughout the abdomen and  pelvis. Distal esophagus, stomach and duodenal sweep are unremarkable. No small bowel wall thickening or dilatation. No evidence of obstruction. Air-filled noninflamed appendix seen at the cecal tip. No colonic dilatation or wall thickening. Vascular/Lymphatic: Atherosclerotic plaque within the normal caliber aorta. Difficult assessment of the lymph nodes in the abdomen and pelvis secondary to a paucity of intraperitoneal fat and patient motion artifact. No grossly enlarged nodes are seen. Reproductive: Mild prostatomegaly with indentation of the bladder base. Seminal vesicles are unremarkable. Other: No free fluid or convincing evidence of free air.  Evaluation limited given motion artifact. No bowel containing hernias. Paucity of subcutaneous fat. No body wall hematoma or traumatic abdominal wall injury. Mild body wall edema. Musculoskeletal: There are incomplete burst fractures involving the superior endplates L1, L2 and L3 with approximately 20-40% height loss at these levels (AOSpine A3). Minimal retropulsion of less than 2 mm at L2 and L3. No severe canal stenosis. Additional anterior wedging compression deformity L5 30% height loss maximally (AOSpine A1). No extension into the posterior elements or disruption of the posterior tension band. No other acute osseous injury is identified. Moderate to severe degenerative changes noted in both hips. Review of the MIP images confirms the above findings. IMPRESSION: 1. Imaging quality is significantly degraded due to patient motion artifact most severe throughout the abdomen and pelvis. 2. No evidence of acute pulmonary embolus. 3. Anterior wedging compression deformities at T4, T5, T6, and L5 (AOSpine A1). Incomplete burst fractures of the superior endplates of L1, L2 and L3 (AOSpine A3). Correlate for point tenderness at the site of multiple vertebral fractures to assess for acuity. No severe resulting canal stenosis or foraminal narrowing. No extension into the posterior elements or disruption of the posterior tension band. 4. Mild circumferential bladder wall thickening, with prostatomegaly finding may be secondary to chronic outlet obstruction. However recommend correlation with urinalysis to exclude cystitis. 5. Prostatomegaly with indentation of the bladder base. 6.  Aortic Atherosclerosis (ICD10-I70.0). 7.  Emphysema (ICD10-J43.9). These results were called by telephone at the time of interpretation on 12/18/2019 at 1:33 am to provider Dr. Dayna Barker, who verbally acknowledged these results. Electronically Signed   By: Lovena Le M.D.   On: 12/18/2019 01:34   DG Chest Port 1 View  Result Date:  12/17/2019 CLINICAL DATA:  Mental status changes EXAM: PORTABLE CHEST 1 VIEW COMPARISON:  Portable exam 1937 hours without priors for comparison FINDINGS: Jewelry artifact projects over LEFT upper lobe. Normal heart size, mediastinal contours, and pulmonary vascularity. Peribronchial thickening with atelectasis versus infiltrate at medial LEFT lower lobe. Prominent first costochondral junctions bilaterally. Remaining lungs clear. No pleural effusion or pneumothorax. Bones demineralized. IMPRESSION: Bronchitic changes with atelectasis versus infiltrate in retrocardiac LEFT lower lobe. Electronically Signed   By: Lavonia Dana M.D.   On: 12/17/2019 19:54    Pending Labs Unresulted Labs (From admission, onward)    Start     Ordered   12/18/19 0140  Influenza panel by PCR (type A & B)  (Influenza PCR Panel)  Once,   STAT     12/18/19 0139   12/18/19 0136  CSF cell count with differential collection tube #: 1  (Meningitis Panel)  Once,   STAT    Question:  collection tube #  Answer:  1   12/18/19 0136   12/18/19 0136  CSF cell count with differential collection tube #: 4  (Meningitis Panel)  Once,   STAT    Question:  collection tube #  Answer:  4  12/18/19 0136   12/18/19 0136  CSF culture  (Meningitis Panel)  Once,   STAT    Question:  Are there also cytology or pathology orders on this specimen?  Answer:  No   12/18/19 0136   12/18/19 0136  Gram stain  (Meningitis Panel)  ONCE - STAT,   STAT     12/18/19 0136   12/18/19 0136  Protein and glucose, CSF  (Meningitis Panel)  Once,   STAT     12/18/19 0136   12/18/19 0136  HIV Antibody (routine testing w rflx)  (Meningitis Panel)  Once,   STAT     12/18/19 0136   12/17/19 2202  APTT  ONCE - STAT,   STAT     12/17/19 2203   12/17/19 2028  Urine culture  ONCE - STAT,   STAT     12/17/19 2028   12/17/19 2028  Culture, blood (routine x 2)  BLOOD CULTURE X 2,   STAT     12/17/19 2028          Vitals/Pain Today's Vitals   12/18/19 0215  12/18/19 0230 12/18/19 0245 12/18/19 0300  BP: 122/66 118/63 130/74 132/80  Pulse: 96 87 83 87  Resp: (!) 24 (!) 26 (!) 22 20  Temp:      TempSrc:      SpO2: 95% 95% 98% 98%  Weight:      PainSc:        Isolation Precautions Droplet precaution  Medications Medications  sodium chloride 0.9 % bolus 250 mL (0 mLs Intravenous Stopped 12/17/19 2102)    Followed by  0.9 %  sodium chloride infusion (1,000 mLs Intravenous New Bag/Given 12/17/19 2131)  sodium chloride 0.9 % bolus 1,000 mL (0 mLs Intravenous Stopped 12/17/19 2151)    Followed by  0.9 %  sodium chloride infusion (1,000 mLs Intravenous Not Given 12/17/19 2129)  LORazepam (ATIVAN) injection 1 mg (1 mg Intravenous Given 12/18/19 0039)  vancomycin (VANCOREADY) IVPB 1250 mg/250 mL (1,250 mg Intravenous New Bag/Given 12/18/19 0229)  lactated ringers bolus 1,000 mL (has no administration in time range)  LORazepam (ATIVAN) injection 1 mg (1 mg Intravenous Given 12/17/19 1806)  cefTRIAXone (ROCEPHIN) 1 g in sodium chloride 0.9 % 100 mL IVPB (0 g Intravenous Stopped 12/17/19 2151)  azithromycin (ZITHROMAX) 500 mg in sodium chloride 0.9 % 250 mL IVPB (0 mg Intravenous Stopped 12/18/19 0000)  LORazepam (ATIVAN) injection 1 mg (1 mg Intravenous Given 12/17/19 2223)  sodium chloride 0.9 % bolus 2,010 mL (0 mL/kg  67 kg Intravenous Stopped 12/18/19 0144)  acetaminophen (TYLENOL) suppository 650 mg (650 mg Rectal Given 12/17/19 2315)  iohexol (OMNIPAQUE) 350 MG/ML injection 100 mL (100 mLs Intravenous Contrast Given 12/18/19 0054)  haloperidol lactate (HALDOL) injection 2 mg (2 mg Intravenous Given 12/18/19 0025)  ziprasidone (GEODON) injection 10 mg (10 mg Intramuscular Given 12/18/19 0144)  sterile water (preservative free) injection (1.2 mLs  Given 12/18/19 0144)    Mobility non-ambulatory High fall risk   Focused Assessments    R Recommendations: See Admitting Provider Note  Report given to:   Additional Notes:

## 2019-12-18 NOTE — Procedures (Addendum)
Patient Name: Sergio Dominguez  MRN: [id number]  Epilepsy Attending: Lora Havens  Referring Physician/Provider: Dr. Alysia Penna Date: 12/18/2019 Duration: 23.58 minutes  Patient history: 68 year old male with altered mental status.  EEG to evaluate for seizures.  Level of alertness: Awake  AEDs during EEG study: Lorazepam  Technical aspects: This EEG study was done with scalp electrodes positioned according to the 10-20 International system of electrode placement. Electrical activity was acquired at a sampling rate of 500Hz  and reviewed with a high frequency filter of 70Hz  and a low frequency filter of 1Hz . EEG data were recorded continuously and digitally stored.   Description: During awake state, no clear posterior dominant rhythm was seen.  EEG showed global generalized 2 to 5 Hz theta-delta slowing admixed with 13 to 15 Hz generalized beta activity.  Of note, EEG was at times difficult to interpret secondary to significant myogenic artifact.  Hyperventilation photic summation were not performed.  Abnormality -Continuous slow, generalized -Excessive beta, generalized  IMPRESSION: This technically difficult study suggestive of mild to moderate diffuse encephalopathy, nonspecific etiology. The excessive beta activity seen in the background is most likely due to the effect of benzodiazepine and is a benign EEG pattern. No seizures or epileptiform discharges were seen throughout the recording.  Avelyn Touch Barbra Sarks

## 2019-12-18 NOTE — ED Provider Notes (Signed)
Labs Reviewed  COMPREHENSIVE METABOLIC PANEL - Abnormal; Notable for the following components:      Result Value   CO2 20 (*)    Glucose, Bld 217 (*)    All other components within normal limits  ACETAMINOPHEN LEVEL - Abnormal; Notable for the following components:   Acetaminophen (Tylenol), Serum <10 (*)    All other components within normal limits  SALICYLATE LEVEL - Abnormal; Notable for the following components:   Salicylate Lvl <1.2 (*)    All other components within normal limits  LACTIC ACID, PLASMA - Abnormal; Notable for the following components:   Lactic Acid, Venous 2.8 (*)    All other components within normal limits  LACTIC ACID, PLASMA - Abnormal; Notable for the following components:   Lactic Acid, Venous 2.8 (*)    All other components within normal limits  CBC WITH DIFFERENTIAL/PLATELET - Abnormal; Notable for the following components:   WBC 20.9 (*)    Neutro Abs 19.6 (*)    Lymphs Abs 0.3 (*)    Abs Immature Granulocytes 0.17 (*)    All other components within normal limits  PROTIME-INR - Abnormal; Notable for the following components:   Prothrombin Time 15.3 (*)    All other components within normal limits  URINALYSIS, ROUTINE W REFLEX MICROSCOPIC - Abnormal; Notable for the following components:   APPearance TURBID (*)    Glucose, UA >=500 (*)    Hgb urine dipstick LARGE (*)    Ketones, ur 20 (*)    Protein, ur 30 (*)    Bacteria, UA RARE (*)    All other components within normal limits  RAPID URINE DRUG SCREEN, HOSP PERFORMED - Abnormal; Notable for the following components:   Tetrahydrocannabinol POSITIVE (*)    All other components within normal limits  CK - Abnormal; Notable for the following components:   Total CK 1,037 (*)    All other components within normal limits  I-STAT CHEM 8, ED - Abnormal; Notable for the following components:   Glucose, Bld 208 (*)    Calcium, Ion 1.03 (*)    All other components within normal limits  CBG MONITORING,  ED - Abnormal; Notable for the following components:   Glucose-Capillary 200 (*)    All other components within normal limits  TROPONIN I (HIGH SENSITIVITY) - Abnormal; Notable for the following components:   Troponin I (High Sensitivity) 31 (*)    All other components within normal limits  TROPONIN I (HIGH SENSITIVITY) - Abnormal; Notable for the following components:   Troponin I (High Sensitivity) 38 (*)    All other components within normal limits  URINE CULTURE  CULTURE, BLOOD (ROUTINE X 2)  CULTURE, BLOOD (ROUTINE X 2)  SARS CORONAVIRUS 2 (TAT 6-24 HRS)  CSF CULTURE  GRAM STAIN  ETHANOL  LIPASE, BLOOD  MAGNESIUM  TSH  AMMONIA  APTT  CSF CELL COUNT WITH DIFFERENTIAL  CSF CELL COUNT WITH DIFFERENTIAL  PROTEIN AND GLUCOSE, CSF  HIV ANTIBODY (ROUTINE TESTING W REFLEX)  INFLUENZA PANEL BY PCR (TYPE A & B)  POC SARS CORONAVIRUS 2 AG -  ED    CT Angio Chest PE W/Cm &/Or Wo Cm  Final Result    CT Abdomen Pelvis W Contrast  Final Result    DG Chest Port 1 View  Final Result    CT Cervical Spine Wo Contrast  Final Result    CT Head Wo Contrast  Final Result      No follow-ups on file.  Physical Exam  BP 136/72   Pulse 92   Temp 98.6 F (37 C)   Resp (!) 34   Wt 67 kg   SpO2 97%   Physical Exam  ED Course/Procedures   Clinical Course as of Dec 18 211  Tue Dec 17, 2019  1757 I have done preliminary review of CT scan and did not see any large intracranial or subdural bleeds.  No midline shift.  No striking anomalies.  Will wait for radiology interpretation.   [MP]  2238 I reassessed the patient.  He was resting but I awakened him to try to ask more questions.  He awakened and he would very clearly and repetitively say "please take these off of me".  He was clearly angry or frustrated about being restrained.  His speech of that one repetitive sentence was very clear.  I asked him why he would not answer any other questions, he stated "just take these off of  me."  I asked several times if he had a headache, initially he would not answer and then he said "no, I do not have a headache."  Nonetheless, he was pulling at the restraints and trying to sit up.    [MP]  2241 Patient cannot safely be taken out of restraints at this time.  He is at risk of fall and potentially pulling out his IVs.  He requires further evaluation including CT scan of the chest and the abdomen.  Patient is febrile.  Is unclear why he very clearly reiterates his desire to be taken out of restraints but will not answer any other questions.  Patient will be given 1 mg of Ativan for sedation to facilitate continuing work-up.   [MP]  Wed Dec 18, 2019  0024 Patient pulled his IV out once he was at CT scan.  CT scan is now still pending.  He has been replaced.  Patient's wife is at bedside.  Patient will sleep but then awakens and becomes agitated.  He clearly states, "take these off of me, untangle me I have to piss."  He however still is not giving any history or interacting normally.  Wife reports this is very atypical for him.  Blood pressure and heart rate remain normal.   [MP]    Clinical Course User Index [MP] Arby BarrettePfeiffer, Marcy, MD    Assumed care from Dr. Donnald GarrePfeiffer please see their note for full history, physical and decision making until this point. In brief this is a 68 y.o. year old male who presented to the ED tonight with Altered Mental Status     Encephalopathic, febrile, leukocytosis. Unclear cause. Possible infiltrate. Abx/cultures already for CAP.   Discussed with radiologist: ct scan with multiple vertebral fractures. No other significant abnormality given motion degradation.  Discussed with wife: last week joint aches, runny nose, fatigue, headache, sleepy. No known sick contacts. Last spoken with around 1600.  Metformin 750 Supposed to be on statin, not taking it.  On my exam is comfortable with geodon. VS WNL. Fever improved. Added on vancomycin with possibility of CSF  infection, discussed LP with wife and attempted to perform it without success. Will possibly need fluoro guided as I'm worried to go to high or too low with vertebral fractures. Also may need sedation? May need MRI and w/ sedation? Plan for admission for same, continue treating for encephalitis.   Labs, studies and imaging reviewed by myself and considered in medical decision making if ordered. Imaging interpreted by radiology.  .Critical Care  Performed by: Marily Memos, MD Authorized by: Marily Memos, MD   Critical care provider statement:    Critical care time (minutes):  45   Critical care was necessary to treat or prevent imminent or life-threatening deterioration of the following conditions:  CNS failure or compromise and sepsis   Critical care was time spent personally by me on the following activities:  Discussions with consultants, evaluation of patient's response to treatment, examination of patient, ordering and performing treatments and interventions, ordering and review of laboratory studies, ordering and review of radiographic studies, pulse oximetry, re-evaluation of patient's condition, obtaining history from patient or surrogate and review of old charts .Lumbar Puncture  Date/Time: 12/18/2019 2:15 AM Performed by: Marily Memos, MD Authorized by: Marily Memos, MD   Consent:    Consent obtained:  Emergent situation and verbal (over phone from wife)   Consent given by:  Spouse   Risks discussed:  Bleeding, infection, headache, pain and repeat procedure   Alternatives discussed:  Delayed treatment and no treatment Pre-procedure details:    Procedure purpose:  Diagnostic   Preparation: Patient was prepped and draped in usual sterile fashion   Sedation:    Sedation type:  Anxiolysis Anesthesia (see MAR for exact dosages):    Anesthesia method:  Local infiltration   Local anesthetic:  Lidocaine 1% w/o epi Procedure details:    Lumbar space:  L3-L4 interspace   Patient  position:  R lateral decubitus   Needle gauge:  18   Needle length (in):  3.5   Ultrasound guidance: no     Number of attempts:  2   Fluid appearance:  Bloody   Tubes of fluid: 0.   Total volume (ml):  0 Post-procedure:    Puncture site:  Adhesive bandage applied and direct pressure applied   Patient tolerance of procedure:  Tolerated well, no immediate complications Comments:     Not able to obtain fluid after two attempts.     MDM  At this point concern for possible encephalitis versus meningitis.  Is being treated with broad-spectrum antibiotics for the same.  Cultures pending.  Not able to obtain LP in the emergency room.  Will likely need a radiologist as there is only a few spaces that they can go.  If negative may need MRIs of his back to evaluate for fractures chronicity this will likely need to be sedated. Will d/w medicine for admission.       Deya Bigos, Barbara Cower, MD 12/18/19 [phone]

## 2019-12-18 NOTE — Progress Notes (Signed)
Pharmacy Antibiotic Note  Sergio Dominguez is a 68 y.o. male admitted on 12/17/2019 with altered mental status.  Pharmacy has been consulted for Vancomycin/Acyclovir dosing for r/o meningitis. WBC is elevated. Renal function OK.   Plan: Vancomycin 1250 mg IV x 1, then 750 mg IV q12h >>>No AUC dosing with r/o meningitis Ceftriaxone 2g IV q12h per MD Ampicillin 2g IV q4h per MD Acyclovir 10 mg/kg IV q8h Trend WBC, temp, renal function  F/U infectious work-up Drug levels as indicated   Weight: 147 lb 11.3 oz (67 kg)  Temp (24hrs), Avg:99.3 F (37.4 C), Min:98 F (36.7 C), Max:101.3 F (38.5 C)  Recent Labs  Lab 12/17/19 1730 12/17/19 1822 12/17/19 1827 12/17/19 2034  WBC  --   --  20.9*  --   CREATININE  --  0.70 1.02  --   LATICACIDVEN 2.8*  --   --  2.8*    CrCl cannot be calculated (Unknown ideal weight.).    Allergies  Allergen Reactions  . Oxycodone Hcl     REACTION: NAUSEA/VOMITNG    Narda Bonds, PharmD, BCPS Clinical Pharmacist Phone: [phone]

## 2019-12-19 ENCOUNTER — Inpatient Hospital Stay (HOSPITAL_COMMUNITY): Payer: Medicare Other

## 2019-12-19 DIAGNOSIS — R821 Myoglobinuria: Secondary | ICD-10-CM

## 2019-12-19 DIAGNOSIS — M6282 Rhabdomyolysis: Secondary | ICD-10-CM

## 2019-12-19 DIAGNOSIS — E785 Hyperlipidemia, unspecified: Secondary | ICD-10-CM

## 2019-12-19 DIAGNOSIS — S32000A Wedge compression fracture of unspecified lumbar vertebra, initial encounter for closed fracture: Secondary | ICD-10-CM

## 2019-12-19 DIAGNOSIS — E119 Type 2 diabetes mellitus without complications: Secondary | ICD-10-CM

## 2019-12-19 DIAGNOSIS — Z8781 Personal history of (healed) traumatic fracture: Secondary | ICD-10-CM

## 2019-12-19 DIAGNOSIS — D72829 Elevated white blood cell count, unspecified: Secondary | ICD-10-CM

## 2019-12-19 LAB — CSF CELL COUNT WITH DIFFERENTIAL
RBC Count, CSF: 0 /mm3
RBC Count, CSF: 10 /mm3 — ABNORMAL HIGH
Tube #: 4
Tube #: 1
WBC, CSF: 3 /mm3 (ref 0–5)
WBC, CSF: 3 /mm3 (ref 0–5)

## 2019-12-19 LAB — COMPREHENSIVE METABOLIC PANEL
ALT: 32 U/L (ref 0–44)
AST: 72 U/L — ABNORMAL HIGH (ref 15–41)
Albumin: 2.9 g/dL — ABNORMAL LOW (ref 3.5–5.0)
Alkaline Phosphatase: 61 U/L (ref 38–126)
Anion gap: 10 (ref 5–15)
BUN: 10 mg/dL (ref 8–23)
CO2: 21 mmol/L — ABNORMAL LOW (ref 22–32)
Calcium: 8.4 mg/dL — ABNORMAL LOW (ref 8.9–10.3)
Chloride: 108 mmol/L (ref 98–111)
Creatinine, Ser: 0.88 mg/dL (ref 0.61–1.24)
GFR calc Af Amer: >60 mL/min (ref >60)
GFR calc non Af Amer: >60 mL/min (ref >60)
Glucose, Bld: 118 mg/dL — ABNORMAL HIGH (ref 70–99)
Potassium: 3.8 mmol/L (ref 3.5–5.1)
Sodium: 139 mmol/L (ref 135–145)
Total Bilirubin: 1.3 mg/dL — ABNORMAL HIGH (ref 0.3–1.2)
Total Protein: 5.7 g/dL — ABNORMAL LOW (ref 6.5–8.1)

## 2019-12-19 LAB — GLUCOSE, CAPILLARY
Glucose-Capillary: 105 mg/dL — ABNORMAL HIGH (ref 70–99)
Glucose-Capillary: 107 mg/dL — ABNORMAL HIGH (ref 70–99)
Glucose-Capillary: 115 mg/dL — ABNORMAL HIGH (ref 70–99)
Glucose-Capillary: 117 mg/dL — ABNORMAL HIGH (ref 70–99)
Glucose-Capillary: 117 mg/dL — ABNORMAL HIGH (ref 70–99)
Glucose-Capillary: 164 mg/dL — ABNORMAL HIGH (ref 70–99)

## 2019-12-19 LAB — URINE CULTURE: Culture: NO GROWTH

## 2019-12-19 LAB — CK
Total CK: 6121 U/L — ABNORMAL HIGH (ref 49–397)
Total CK: 6293 U/L — ABNORMAL HIGH (ref 49–397)

## 2019-12-19 LAB — CBC
HCT: 38.9 % — ABNORMAL LOW (ref 39.0–52.0)
Hemoglobin: 12.8 g/dL — ABNORMAL LOW (ref 13.0–17.0)
MCH: 30.5 pg (ref 26.0–34.0)
MCHC: 32.9 g/dL (ref 30.0–36.0)
MCV: 92.8 fL (ref 80.0–100.0)
Platelets: 151 10*3/uL (ref 150–400)
RBC: 4.19 MIL/uL — ABNORMAL LOW (ref 4.22–5.81)
RDW: 13.9 % (ref 11.5–15.5)
WBC: 13.4 10*3/uL — ABNORMAL HIGH (ref 4.0–10.5)
nRBC: 0 % (ref 0.0–0.2)

## 2019-12-19 LAB — PROTEIN, CSF: Total  Protein, CSF: 34 mg/dL (ref 15–45)

## 2019-12-19 LAB — GLUCOSE, CSF: Glucose, CSF: 68 mg/dL (ref 40–70)

## 2019-12-19 MED ORDER — LIDOCAINE HCL (PF) 1 % IJ SOLN
5.0000 mL | Freq: Once | INTRAMUSCULAR | Status: AC
  Administered 2019-12-19: 5 mL via INTRADERMAL

## 2019-12-19 MED ORDER — GADOBUTROL 1 MMOL/ML IV SOLN
6.5000 mL | Freq: Once | INTRAVENOUS | Status: AC | PRN
  Administered 2019-12-19: 6.5 mL via INTRAVENOUS

## 2019-12-19 MED ORDER — INSULIN ASPART 100 UNIT/ML ~~LOC~~ SOLN
0.0000 [IU] | Freq: Three times a day (TID) | SUBCUTANEOUS | Status: DC
  Administered 2019-12-19: 18:00:00 2 [IU] via SUBCUTANEOUS
  Administered 2019-12-20: 1 [IU] via SUBCUTANEOUS
  Administered 2019-12-20: 12:00:00 2 [IU] via SUBCUTANEOUS
  Administered 2019-12-21: 1 [IU] via SUBCUTANEOUS

## 2019-12-19 MED ORDER — LACTATED RINGERS IV SOLN: INTRAVENOUS | Status: DC

## 2019-12-19 NOTE — Progress Notes (Signed)
Pt educated by RN on importance of laying flat. Pt accepting of information and education. 1545 Pt sat up to use urinal. RN responded to bed alarm from Pt's room and reeducated Pt on importance of laying flat.

## 2019-12-19 NOTE — Progress Notes (Signed)
Pt returned from IR post lumbar puncture around 1045. A lay flat order was put into the signed and held to be released. Around 1345 I received a notification That there were signed and held orders from more than 2 hours prior. I then saw and released the lay flat order. Pt had been sitting up and eating. I reclined the Pt to 19 degrees. The Pt stated he could not lay any flatter because it hurt his back too much. The Pt rolled to his side there was no swelling at the puncture site and no leaking. The Pt states he has no headache. MD notified.

## 2019-12-19 NOTE — Procedures (Signed)
CLINICAL DATA: Encephalopathy, possible meningitis  EXAM:  DIAGNOSTIC LUMBAR PUNCTURE UNDER FLUOROSCOPIC GUIDANCE  FLUOROSCOPY TIME: Fluoroscopy Time:  0 minutes, 18 seconds  Radiation Exposure Index (if provided by the fluoroscopic device):  1.8 mGy  Number of Acquired Spot Images: 0  PROCEDURE:  I discussed the risks (including hemorrhage, infection, and nerve damage, among others), benefits, and alternatives to fluoroscopically guided lumbar puncture with patient's wife by telephone, as the patient is unable to consent and has altered mental status.  We specifically discussed the high likelihood of technical success of the procedure.  The patient's wife understood and elected for the patient undergo the procedure.   Standard time-out was employed.  Following sterile skin prep and local anesthetic administration consisting of 1 percent lidocaine, a 20 gauge spinal needle was advanced without difficulty into the thecal sac at the L5-S1 level under fluoroscopic guidance.  Clear CSF was returned.  A pressure measurement obtained with patient prone demonstrated CSF pressure of 16 cm of water.    A total of 12 cc of clear CSF was collected in 4 vials.  The needle was subsequently removed and the skin cleansed and bandaged.  No immediate complications were observed.   IMPRESSION:  Technically successful fluoroscopically guided lumbar puncture yielding 12 cc of clear CSF.

## 2019-12-19 NOTE — Progress Notes (Signed)
[facility]  PROGRESS NOTE  Kyce Ging UEA:[id number] DOB: [birth year] DOA: 12/17/2019 PCP: Tracey Harries, MD Admit date - 12/17/2019   Admitting Physician Eduard Clos, MD  Outpatient Primary MD for the patient is Tracey Harries, MD  LOS - 1 Brief Narrative   Sergio Dominguez is a 68 y.o. year old male with medical history significant for type 2 diabetes, HLD who presented on 12/17/2019 with increased confusion in 24 hours reported by wife.  And was admitted with working diagnosis of altered mental status with fever concerning for encephalitis versus meningitis.  ED course notable for T-max of 101, chest x-ray showing possible infiltrates, CTA chest and abdomen nonacute except for multiple fractures in thoracic and lumbar spine of unclear acuity.  Patient empirically started on IV ceftriaxone, acyclovir, vancomycin, ampicillin, unable to obtain LP in ED due to confusion, unable to obtain fluoroscopic guided LP on 12/23 due to agitation.  Lab work-up notable so far for negative salicylates, acetaminophen, LFTs, ammonia. CK noted to be 1037 on admission.  UDS positive for THC.  CT head nonacute  History provided by wife on 12/23  She left him 730 am to go to work She last spoke with him 4:15 yesterday am He took his son to work with no issues She called around 3 and he didn't answer which is not unusual but he didn't call back. Around 3 pm she went to check on him at the house and found the front door ajar found him lying on the floor upstairs in the bedroom "semi-conscious" She yelled his name and he seemed dazed. For all her questions he answered "I don't know, where am I?" She noticed his underwear had urine on it.  She thinks he fell twice because of how disshelved the downstairs looked   His baseline is independent. No history of seizures. No new medications.  He had been complaining of neck pain for the past month. They thought it was MSK    Subjective  Mr.  Sergio Dominguez  today is completely back to his mental status baseline. Has soreness in his back  A & P   Fever with increased confusion, concerning for infectious encephalitis, resolved.  Unclear etiology, mental status is back to baseline doubt meningitis/encephalitis  Other workup has been negative so far, concern for potential withdrawal from substance given positive UDS for THC(he states he doesn't recall having marijuana prior to the episode. On no other contributing medications. EEG suggests diffuse encephalopathy without seizure activityu  -empiric IV vancomycin, ampicillin, acyclovir, ceftriaxone until results from IR guided LP result(12/24)--if no pleocytosis can dc per neurology -MRI brain to evaluate for intracranial normalities -Neurochecks, monitor for withdrawal symptoms -Judicious use of IV Haldol/Ativan for agitation  Leukocytosis. Elevated to 21 on admission and now downtrending. Diffuse airway thickening and secretions on CT chest without evidence of cosolidation or effusion.  -trend blood cultures - on empiric antibiotics  Lactic acidosis, downtrending Peak of 3.1, down to 2.7. BP is normotensive -Continue IVF  Rhabdomyolysis with myoglobinuria in setting of fall, still increasing -trend CK-uptrending--now 6000 this am, repeat till downtrending -given improvement in mental status encourage hydration and timed IVF -Holding home statins  Multiple vertebral fractures of T4-T6 and L1-L3, L5 on CT imaging Still moving extremities without notable deficits on my exam.  Unclear acuity, likely related to recent fall given point tenderness. No neurologic deficit --PT eval and treat -Continue to monitor  Type 2 diabetes, A1c 7.8% CBGs at goal -diabetic diet -Monitor CBG sliding scale  as needed   Family Communication  :  Will update wife  Code Status :  FULL  Disposition Plan  : Close monitoring mental status, still needs MRI for work-up, continue IV antibiotics until LP  results  Consults  :  IR, Neurology  Procedures  : EEG 12/23, LP under fluoroscopy 12/24  DVT Prophylaxis  : SCDs  Lab Results  Component Value Date   PLT 151 12/19/2019    Diet :  Diet Order            Diet Carb Modified Fluid consistency: Thin; Room service appropriate? Yes  Diet effective now               Inpatient Medications Scheduled Meds: . Chlorhexidine Gluconate Cloth  6 each Topical Daily  . insulin aspart  0-9 Units Subcutaneous Q4H   Continuous Infusions: . acyclovir 670 mg (12/19/19 0601)  . ampicillin (OMNIPEN) IV 2 g (12/19/19 0747)  . cefTRIAXone (ROCEPHIN)  IV 2 g (12/19/19 1046)  . lactated ringers 100 mL/hr at 12/19/19 0851  . vancomycin 750 mg (12/19/19 1221)   PRN Meds:.acetaminophen **OR** acetaminophen, haloperidol lactate, LORazepam, ondansetron **OR** ondansetron (ZOFRAN) IV  Antibiotics  :   Anti-infectives (From admission, onward)   Start     Dose/Rate Route Frequency Ordered Stop   12/18/19 2300  vancomycin (VANCOREADY) IVPB 750 mg/150 mL     750 mg 150 mL/hr over 60 Minutes Intravenous Every 12 hours 12/18/19 1403     12/18/19 1000  vancomycin (VANCOREADY) IVPB 750 mg/150 mL  Status:  Discontinued     750 mg 150 mL/hr over 60 Minutes Intravenous Every 12 hours 12/18/19 0412 12/18/19 1403   12/18/19 0445  acyclovir (ZOVIRAX) 670 mg in dextrose 5 % 100 mL IVPB     10 mg/kg  67 kg 113.4 mL/hr over 60 Minutes Intravenous Every 8 hours 12/18/19 0412     12/18/19 0415  cefTRIAXone (ROCEPHIN) 2 g in sodium chloride 0.9 % 100 mL IVPB     2 g 200 mL/hr over 30 Minutes Intravenous Every 12 hours 12/18/19 0409     12/18/19 0415  ampicillin (OMNIPEN) 2 g in sodium chloride 0.9 % 100 mL IVPB     2 g 300 mL/hr over 20 Minutes Intravenous Every 4 hours 12/18/19 0409     12/18/19 0200  vancomycin (VANCOREADY) IVPB 1250 mg/250 mL     1,250 mg 166.7 mL/hr over 90 Minutes Intravenous  Once 12/18/19 0136 12/18/19 0424   12/17/19 2030  cefTRIAXone  (ROCEPHIN) 1 g in sodium chloride 0.9 % 100 mL IVPB     1 g 200 mL/hr over 30 Minutes Intravenous  Once 12/17/19 2028 12/17/19 2151   12/17/19 2030  azithromycin (ZITHROMAX) 500 mg in sodium chloride 0.9 % 250 mL IVPB     500 mg 250 mL/hr over 60 Minutes Intravenous  Once 12/17/19 2028 12/18/19 0000       Objective   Vitals:   12/18/19 2334 12/19/19 0344 12/19/19 0745 12/19/19 1130  BP: 124/67 109/71 123/66 (!) 143/84  Pulse: 89 71 93 80  Resp:  19 19 16   Temp: 98.6 F (37 C) 97.7 F (36.5 C) 98.4 F (36.9 C) 98.1 F (36.7 C)  TempSrc: Oral Oral Oral Oral  SpO2: 98% 98% 95% 97%  Weight:      Height:        SpO2: 97 %  Wt Readings from Last 3 Encounters:  12/17/19 67 kg  Intake/Output Summary (Last 24 hours) at 12/19/2019 1239 Last data filed at 12/19/2019 1025 Gross per 24 hour  Intake 1753.86 ml  Output 2700 ml  Net -946.14 ml    Physical Exam:  Awake Alert, oriented to self, person, place, time Able to follow commands without deficits No appreciable focal deficits normal oral mucosa [employer].AT Symmetrical Chest wall movement, Good air movement bilaterally, CTAB RRR,No Gallops,Rubs or new Murmurs,  +ve B.Sounds, Abd Soft, No tenderness,  No rebound, guarding or rigidity. No Cyanosis, Clubbing or edema, No new Rash or bruise     I have personally reviewed the following:   Data Reviewed:  CBC Recent Labs  Lab 12/17/19 1822 12/17/19 1827 12/18/19 0502 12/19/19 0208  WBC  --  20.9* 21.1* 13.4*  HGB 15.3 15.3 14.1 12.8*  HCT 45.0 46.4 42.6 38.9*  PLT  --  178 161 151  MCV  --  93.5 92.8 92.8  MCH  --  30.8 30.7 30.5  MCHC  --  33.0 33.1 32.9  RDW  --  14.1 14.2 13.9  LYMPHSABS  --  0.3* 1.1  --   MONOABS  --  0.8 1.3*  --   EOSABS  --  0.0 0.0  --   BASOSABS  --  0.0 0.0  --     Chemistries  Recent Labs  Lab 12/17/19 1822 12/17/19 1827 12/18/19 0502 12/19/19 0208  NA 138 137 139 139  K 4.2 3.8 4.0 3.8  CL 107 104 107 108  CO2  --   20* 20* 21*  GLUCOSE 208* 217* 152* 118*  BUN 13 10 9 10   CREATININE 0.70 1.02 0.77 0.88  CALCIUM  --  9.0 8.7* 8.4*  MG  --  1.9 1.8  --   AST  --  36 59* 72*  ALT  --  33 34 32  ALKPHOS  --  86 79 61  BILITOT  --  1.1 1.1 1.3*   ------------------------------------------------------------------------------------------------------------------ No results for input(s): CHOL, HDL, LDLCALC, TRIG, CHOLHDL, LDLDIRECT in the last 72 hours.  Lab Results  Component Value Date   HGBA1C 7.8 (H) 12/18/2019   ------------------------------------------------------------------------------------------------------------------ Recent Labs    12/18/19 0502  TSH 1.699   ------------------------------------------------------------------------------------------------------------------ No results for input(s): VITAMINB12, FOLATE, FERRITIN, TIBC, IRON, RETICCTPCT in the last 72 hours.  Coagulation profile Recent Labs  Lab 12/17/19 1827  INR 1.2    No results for input(s): DDIMER in the last 72 hours.  Cardiac Enzymes No results for input(s): CKMB, TROPONINI, MYOGLOBIN in the last 168 hours.  Invalid input(s): CK ------------------------------------------------------------------------------------------------------------------ No results found for: BNP  Micro Results Recent Results (from the past 240 hour(s))  Urine culture     Status: None   Collection Time: 12/17/19  8:15 PM   Specimen: Urine, Random  Result Value Ref Range Status   Specimen Description URINE, RANDOM  Final   Special Requests NONE  Final   Culture   Final    NO GROWTH Performed at [facility], [address], [place], [place] [address]    Report Status 12/19/2019 FINAL  Final  Culture, blood (routine x 2)     Status: None (Preliminary result)   Collection Time: 12/17/19  8:28 PM   Specimen: BLOOD  Result Value Ref Range Status   Specimen Description BLOOD RIGHT ARM  Final   Special Requests   Final     BOTTLES DRAWN AEROBIC AND ANAEROBIC Blood Culture results may not be optimal due to an  inadequate volume of blood received in culture bottles   Culture   Final    NO GROWTH 2 DAYS Performed at [facility], [address] [place], [place] [address]    Report Status PENDING  Incomplete  Culture, blood (routine x 2)     Status: None (Preliminary result)   Collection Time: 12/17/19  8:55 PM   Specimen: BLOOD  Result Value Ref Range Status   Specimen Description BLOOD LEFT ARM  Final   Special Requests   Final    BOTTLES DRAWN AEROBIC AND ANAEROBIC Blood Culture results may not be optimal due to an inadequate volume of blood received in culture bottles   Culture   Final    NO GROWTH 2 DAYS Performed at [facility], [address], [place], [place] [address]    Report Status PENDING  Incomplete  SARS CORONAVIRUS 2 (TAT 6-24 HRS) Nasopharyngeal Nasopharyngeal Swab     Status: None   Collection Time: 12/17/19  9:52 PM   Specimen: Nasopharyngeal Swab  Result Value Ref Range Status   SARS Coronavirus 2 NEGATIVE NEGATIVE Final    Comment: (NOTE) SARS-CoV-2 target nucleic acids are NOT DETECTED. The SARS-CoV-2 RNA is generally detectable in upper and lower respiratory specimens during the acute phase of infection. Negative results do not preclude SARS-CoV-2 infection, do not rule out co-infections with other pathogens, and should not be used as the sole basis for treatment or other patient management decisions. Negative results must be combined with clinical observations, patient history, and epidemiological information. The expected result is Negative. Fact Sheet for Patients: HairSlick.no Fact Sheet for Healthcare Providers: quierodirigir.com This test is not yet approved or cleared by the [place] FDA and  has been authorized for detection and/or diagnosis of SARS-CoV-2 by FDA under an Emergency Use Authorization  (EUA). This EUA will remain  in effect (meaning this test can be used) for the duration of the COVID-19 declaration under Section 56 4(b)(1) of the Act, 21 U.S.C. section 360bbb-3(b)(1), unless the authorization is terminated or revoked sooner. Performed at [facility], [address] [place], [place] [address]   MRSA PCR Screening     Status: None   Collection Time: 12/18/19  5:55 AM   Specimen: Nasopharyngeal  Result Value Ref Range Status   MRSA by PCR NEGATIVE NEGATIVE Final    Comment:        The GeneXpert MRSA Assay (FDA approved for NASAL specimens only), is one component of a comprehensive MRSA colonization surveillance program. It is not intended to diagnose MRSA infection nor to guide or monitor treatment for MRSA infections. Performed at [facility], [address], [place], [place] [address]   CSF culture     Status: None (Preliminary result)   Collection Time: 12/19/19 10:34 AM   Specimen: PATH Cytology CSF; Cerebrospinal Fluid  Result Value Ref Range Status   Specimen Description CSF  Final   Special Requests NONE  Final   Gram Stain   Final    NO WBC SEEN NO ORGANISMS SEEN CYTOSPIN SMEAR Performed at [facility], [address] [place], [place] [address]    Culture PENDING  Incomplete   Report Status PENDING  Incomplete    Radiology Reports CT Head Wo Contrast  Result Date: 12/17/2019 CLINICAL DATA:  Altered mental status. Episode of unresponsiveness today. EXAM: CT HEAD WITHOUT CONTRAST CT CERVICAL SPINE WITHOUT CONTRAST TECHNIQUE: Multidetector CT imaging of the head and cervical  spine was performed following the standard protocol without intravenous contrast. Multiplanar CT image reconstructions of the cervical spine were also generated. COMPARISON:  None. FINDINGS: CT HEAD FINDINGS Brain: No evidence of acute infarction, hemorrhage, hydrocephalus, extra-axial collection or mass lesion/mass effect. Vascular: No hyperdense vessel or  unexpected calcification. Skull: Intact.  No focal lesion. Sinuses/Orbits: Scattered ethmoid air cell disease is seen. Small mucous retention cyst or polyp right maxillary sinus noted. Other: None. CT CERVICAL SPINE FINDINGS Alignment: Maintained. Skull base and vertebrae: No acute fracture. No primary bone lesion or focal pathologic process. Soft tissues and spinal canal: No prevertebral fluid or swelling. No visible canal hematoma. Disc levels: Very mild loss of disc space height and endplate spurring are seen at C3-4 and C5-6. Upper chest: Emphysematous disease is noted.  Lung apices are clear. Other: None. IMPRESSION: No acute abnormality head or cervical spine. Mild cervical degenerative change. Emphysema. Electronically Signed   By: Drusilla Kanner M.D.   On: 12/17/2019 18:13   CT Angio Chest PE W/Cm &/Or Wo Cm  Result Date: 12/18/2019 CLINICAL DATA:  Altered mental status, unresponsive on EMS arrival, agitation abdominal abscess, infection suspected EXAM: CT ANGIOGRAPHY CHEST CT ABDOMEN AND PELVIS WITH CONTRAST TECHNIQUE: Multidetector CT imaging of the chest was performed using the standard protocol during bolus administration of intravenous contrast. Multiplanar CT image reconstructions and MIPs were obtained to evaluate the vascular anatomy. Multidetector CT imaging of the abdomen and pelvis was performed using the standard protocol during bolus administration of intravenous contrast. CONTRAST:  100 cc OMNIPAQUE IOHEXOL 350 MG/ML SOLN COMPARISON:  Chest radiograph 12/17/2019 FINDINGS: CTA CHEST FINDINGS Cardiovascular: Satisfactory opacification the pulmonary arteries to the segmental level. No pulmonary artery filling defects are identified. Central pulmonary arteries are normal caliber. The aorta is normal caliber. Normal 3 vessel branching of the aortic arch. Normal heart size. No pericardial effusion. Mediastinum/Nodes: No mediastinal free fluid or air. Scattered secretions seen in the trachea  and central airways. No acute abnormality of the esophagus. Thyroid gland and thoracic inlet are unremarkable. No visible mediastinal, hilar or axillary adenopathy. Lungs/Pleura: Centrilobular and paraseptal emphysematous changes are noted diffusely. There is mild diffuse airways thickening and scattered secretions. Atelectatic changes are present dependently. No consolidation, features of edema, pneumothorax, or effusion. No suspicious pulmonary nodules or masses. Musculoskeletal: Age-indeterminate anterior wedging compression deformities at T4, T5 and T6 with approximately 20-30% height loss anteriorly (AOSpine A1). Milder compression deformity suspected at the superior T9 endplate as well with approximately 5% height loss. No extension into the posterior elements or disruption of the posterior tension band. Background of multilevel discogenic and facet degenerative changes in the spine. Review of the MIP images confirms the above findings. CT ABDOMEN and PELVIS FINDINGS Hepatobiliary: 1.3 cm fluid attenuation cyst in the left lobe liver. No worrisome hepatic lesion. Normal gallbladder. No gallbladder wall thickening or pericholecystic fluid. No visible calcified gallstones or biliary ductal dilatation. Pancreas: Unremarkable. No pancreatic ductal dilatation or surrounding inflammatory changes. Spleen: Normal in size without focal abnormality. Adrenals/Urinary Tract: Normal adrenal glands. 2.2 cm fluid attenuation cysts seen in the anterior upper pole right kidney. No concerning focal renal lesion. No urolithiasis or hydronephrosis. Difficult to assess the ureters given extensive patient motion artifact. Mild circumferential bladder wall thickening is noted. Indentation of the posterior bladder by the borderline enlarged prostate Stomach/Bowel: Bowel assessment is complicated by extensive patient motion artifact throughout the abdomen and pelvis. Distal esophagus, stomach and duodenal sweep are unremarkable. No  small bowel wall thickening or  dilatation. No evidence of obstruction. Air-filled noninflamed appendix seen at the cecal tip. No colonic dilatation or wall thickening. Vascular/Lymphatic: Atherosclerotic plaque within the normal caliber aorta. Difficult assessment of the lymph nodes in the abdomen and pelvis secondary to a paucity of intraperitoneal fat and patient motion artifact. No grossly enlarged nodes are seen. Reproductive: Mild prostatomegaly with indentation of the bladder base. Seminal vesicles are unremarkable. Other: No free fluid or convincing evidence of free air. Evaluation limited given motion artifact. No bowel containing hernias. Paucity of subcutaneous fat. No body wall hematoma or traumatic abdominal wall injury. Mild body wall edema. Musculoskeletal: There are incomplete burst fractures involving the superior endplates L1, L2 and L3 with approximately 20-40% height loss at these levels (AOSpine A3). Minimal retropulsion of less than 2 mm at L2 and L3. No severe canal stenosis. Additional anterior wedging compression deformity L5 30% height loss maximally (AOSpine A1). No extension into the posterior elements or disruption of the posterior tension band. No other acute osseous injury is identified. Moderate to severe degenerative changes noted in both hips. Review of the MIP images confirms the above findings. IMPRESSION: 1. Imaging quality is significantly degraded due to patient motion artifact most severe throughout the abdomen and pelvis. 2. No evidence of acute pulmonary embolus. 3. Anterior wedging compression deformities at T4, T5, T6, and L5 (AOSpine A1). Incomplete burst fractures of the superior endplates of L1, L2 and L3 (AOSpine A3). Correlate for point tenderness at the site of multiple vertebral fractures to assess for acuity. No severe resulting canal stenosis or foraminal narrowing. No extension into the posterior elements or disruption of the posterior tension band. 4. Mild  circumferential bladder wall thickening, with prostatomegaly finding may be secondary to chronic outlet obstruction. However recommend correlation with urinalysis to exclude cystitis. 5. Prostatomegaly with indentation of the bladder base. 6.  Aortic Atherosclerosis (ICD10-I70.0). 7.  Emphysema (ICD10-J43.9). These results were called by telephone at the time of interpretation on 12/18/2019 at 1:33 am to provider Dr. Clayborne DanaMesner, who verbally acknowledged these results. Electronically Signed   By: Kreg ShropshirePrice  DeHay M.D.   On: 12/18/2019 01:34   CT Cervical Spine Wo Contrast  Result Date: 12/17/2019 CLINICAL DATA:  Altered mental status. Episode of unresponsiveness today. EXAM: CT HEAD WITHOUT CONTRAST CT CERVICAL SPINE WITHOUT CONTRAST TECHNIQUE: Multidetector CT imaging of the head and cervical spine was performed following the standard protocol without intravenous contrast. Multiplanar CT image reconstructions of the cervical spine were also generated. COMPARISON:  None. FINDINGS: CT HEAD FINDINGS Brain: No evidence of acute infarction, hemorrhage, hydrocephalus, extra-axial collection or mass lesion/mass effect. Vascular: No hyperdense vessel or unexpected calcification. Skull: Intact.  No focal lesion. Sinuses/Orbits: Scattered ethmoid air cell disease is seen. Small mucous retention cyst or polyp right maxillary sinus noted. Other: None. CT CERVICAL SPINE FINDINGS Alignment: Maintained. Skull base and vertebrae: No acute fracture. No primary bone lesion or focal pathologic process. Soft tissues and spinal canal: No prevertebral fluid or swelling. No visible canal hematoma. Disc levels: Very mild loss of disc space height and endplate spurring are seen at C3-4 and C5-6. Upper chest: Emphysematous disease is noted.  Lung apices are clear. Other: None. IMPRESSION: No acute abnormality head or cervical spine. Mild cervical degenerative change. Emphysema. Electronically Signed   By: Drusilla Kannerhomas  Dalessio M.D.   On: 12/17/2019  18:13   CT Abdomen Pelvis W Contrast  Result Date: 12/18/2019 CLINICAL DATA:  Altered mental status, unresponsive on EMS arrival, agitation abdominal abscess, infection suspected EXAM: CT  ANGIOGRAPHY CHEST CT ABDOMEN AND PELVIS WITH CONTRAST TECHNIQUE: Multidetector CT imaging of the chest was performed using the standard protocol during bolus administration of intravenous contrast. Multiplanar CT image reconstructions and MIPs were obtained to evaluate the vascular anatomy. Multidetector CT imaging of the abdomen and pelvis was performed using the standard protocol during bolus administration of intravenous contrast. CONTRAST:  100 cc OMNIPAQUE IOHEXOL 350 MG/ML SOLN COMPARISON:  Chest radiograph 12/17/2019 FINDINGS: CTA CHEST FINDINGS Cardiovascular: Satisfactory opacification the pulmonary arteries to the segmental level. No pulmonary artery filling defects are identified. Central pulmonary arteries are normal caliber. The aorta is normal caliber. Normal 3 vessel branching of the aortic arch. Normal heart size. No pericardial effusion. Mediastinum/Nodes: No mediastinal free fluid or air. Scattered secretions seen in the trachea and central airways. No acute abnormality of the esophagus. Thyroid gland and thoracic inlet are unremarkable. No visible mediastinal, hilar or axillary adenopathy. Lungs/Pleura: Centrilobular and paraseptal emphysematous changes are noted diffusely. There is mild diffuse airways thickening and scattered secretions. Atelectatic changes are present dependently. No consolidation, features of edema, pneumothorax, or effusion. No suspicious pulmonary nodules or masses. Musculoskeletal: Age-indeterminate anterior wedging compression deformities at T4, T5 and T6 with approximately 20-30% height loss anteriorly (AOSpine A1). Milder compression deformity suspected at the superior T9 endplate as well with approximately 5% height loss. No extension into the posterior elements or disruption of  the posterior tension band. Background of multilevel discogenic and facet degenerative changes in the spine. Review of the MIP images confirms the above findings. CT ABDOMEN and PELVIS FINDINGS Hepatobiliary: 1.3 cm fluid attenuation cyst in the left lobe liver. No worrisome hepatic lesion. Normal gallbladder. No gallbladder wall thickening or pericholecystic fluid. No visible calcified gallstones or biliary ductal dilatation. Pancreas: Unremarkable. No pancreatic ductal dilatation or surrounding inflammatory changes. Spleen: Normal in size without focal abnormality. Adrenals/Urinary Tract: Normal adrenal glands. 2.2 cm fluid attenuation cysts seen in the anterior upper pole right kidney. No concerning focal renal lesion. No urolithiasis or hydronephrosis. Difficult to assess the ureters given extensive patient motion artifact. Mild circumferential bladder wall thickening is noted. Indentation of the posterior bladder by the borderline enlarged prostate Stomach/Bowel: Bowel assessment is complicated by extensive patient motion artifact throughout the abdomen and pelvis. Distal esophagus, stomach and duodenal sweep are unremarkable. No small bowel wall thickening or dilatation. No evidence of obstruction. Air-filled noninflamed appendix seen at the cecal tip. No colonic dilatation or wall thickening. Vascular/Lymphatic: Atherosclerotic plaque within the normal caliber aorta. Difficult assessment of the lymph nodes in the abdomen and pelvis secondary to a paucity of intraperitoneal fat and patient motion artifact. No grossly enlarged nodes are seen. Reproductive: Mild prostatomegaly with indentation of the bladder base. Seminal vesicles are unremarkable. Other: No free fluid or convincing evidence of free air. Evaluation limited given motion artifact. No bowel containing hernias. Paucity of subcutaneous fat. No body wall hematoma or traumatic abdominal wall injury. Mild body wall edema. Musculoskeletal: There are  incomplete burst fractures involving the superior endplates L1, L2 and L3 with approximately 20-40% height loss at these levels (AOSpine A3). Minimal retropulsion of less than 2 mm at L2 and L3. No severe canal stenosis. Additional anterior wedging compression deformity L5 30% height loss maximally (AOSpine A1). No extension into the posterior elements or disruption of the posterior tension band. No other acute osseous injury is identified. Moderate to severe degenerative changes noted in both hips. Review of the MIP images confirms the above findings. IMPRESSION: 1. Imaging quality is significantly degraded  due to patient motion artifact most severe throughout the abdomen and pelvis. 2. No evidence of acute pulmonary embolus. 3. Anterior wedging compression deformities at T4, T5, T6, and L5 (AOSpine A1). Incomplete burst fractures of the superior endplates of L1, L2 and L3 (AOSpine A3). Correlate for point tenderness at the site of multiple vertebral fractures to assess for acuity. No severe resulting canal stenosis or foraminal narrowing. No extension into the posterior elements or disruption of the posterior tension band. 4. Mild circumferential bladder wall thickening, with prostatomegaly finding may be secondary to chronic outlet obstruction. However recommend correlation with urinalysis to exclude cystitis. 5. Prostatomegaly with indentation of the bladder base. 6.  Aortic Atherosclerosis (ICD10-I70.0). 7.  Emphysema (ICD10-J43.9). These results were called by telephone at the time of interpretation on 12/18/2019 at 1:33 am to provider Dr. Clayborne Dana, who verbally acknowledged these results. Electronically Signed   By: Kreg Shropshire M.D.   On: 12/18/2019 01:34   DG Chest Port 1 View  Result Date: 12/17/2019 CLINICAL DATA:  Mental status changes EXAM: PORTABLE CHEST 1 VIEW COMPARISON:  Portable exam 1937 hours without priors for comparison FINDINGS: Jewelry artifact projects over LEFT upper lobe. Normal heart  size, mediastinal contours, and pulmonary vascularity. Peribronchial thickening with atelectasis versus infiltrate at medial LEFT lower lobe. Prominent first costochondral junctions bilaterally. Remaining lungs clear. No pleural effusion or pneumothorax. Bones demineralized. IMPRESSION: Bronchitic changes with atelectasis versus infiltrate in retrocardiac LEFT lower lobe. Electronically Signed   By: Ulyses Southward M.D.   On: 12/17/2019 19:54   EEG adult  Result Date: 12/18/2019 Charlsie Quest, MD     12/18/2019  1:01 PM Patient Name: Sergio Dominguez MRN: [id number] Epilepsy Attending: Charlsie Quest Referring Physician/Provider: Dr. Garrison [place] Date: 12/18/2019 Duration: 23.58 minutes Patient history: 68 year old male with altered mental status.  EEG to evaluate for seizures. Level of alertness: Awake AEDs during EEG study: Lorazepam Technical aspects: This EEG study was done with scalp electrodes positioned according to the 10-20 International system of electrode placement. Electrical activity was acquired at a sampling rate of  and reviewed with a high frequency filter of  and a low frequency filter of . EEG data were recorded continuously and digitally stored. Description: During awake state, no clear posterior dominant rhythm was seen.  EEG showed global generalized 2 to 5 Hz theta-delta slowing admixed with 13 to 15 Hz generalized beta activity.  Of note, EEG was at times difficult to interpret secondary to significant myogenic artifact.  Hyperventilation photic summation were not performed. Abnormality -Continuous slow, generalized -Excessive beta, generalized IMPRESSION: This technically difficult study suggestive of mild to moderate diffuse encephalopathy, nonspecific etiology. The excessive beta activity seen in the background is most likely due to the effect of benzodiazepine and is a benign EEG pattern. No seizures or epileptiform discharges were seen throughout the recording. Priyanka  Annabelle Harman   DG FL GUIDED LUMBAR PUNCTURE  Result Date: 12/19/2019 CLINICAL DATA:  Encephalopathy, possible meningitis EXAM: DIAGNOSTIC LUMBAR PUNCTURE UNDER FLUOROSCOPIC GUIDANCE FLUOROSCOPY TIME:  Fluoroscopy Time:  0 minutes, 18 seconds Radiation Exposure Index (if provided by the fluoroscopic device): 1.8 mGy Number of Acquired Spot Images: 0 PROCEDURE: I discussed the risks (including hemorrhage, infection, and nerve damage, among others), benefits, and alternatives to fluoroscopically guided lumbar puncture with patient's wife by telephone, as the patient is unable to consent and has altered mental status. We specifically discussed the high likelihood of technical success of the procedure. The patient's wife understood and elected for  the patient undergo the procedure. Standard time-out was employed. Following sterile skin prep and local anesthetic administration consisting of 1 percent lidocaine, a 20 gauge spinal needle was advanced without difficulty into the thecal sac at the L5-S1 level under fluoroscopic guidance. Clear CSF was returned. A pressure measurement obtained with patient prone demonstrated CSF pressure of 16 cm of water. A total of 12 cc of clear CSF was collected in 4 vials. The needle was subsequently removed and the skin cleansed and bandaged. No immediate complications were observed. IMPRESSION: 1. Technically successful fluoroscopically guided lumbar puncture yielding 12 cc of clear CSF. Electronically Signed   By: Gaylyn Rong M.D.   On: 12/19/2019 10:45     Time Spent in minutes  30     Laverna Peace M.D on 12/19/2019 at 12:39 PM  To page go to [email or web address] - password [facility]

## 2019-12-19 NOTE — Progress Notes (Signed)
Subjective: Patient has continued to improve.  I asked about medications today, he does not take any serotonergic medications at baseline.  He is not aware of using any drugs other than marijuana and thinks it unlikely that he use that prior to the current presentation either.  Exam: Vitals:   12/19/19 0344 12/19/19 0745  BP: 109/71 123/66  Pulse: 71 93  Resp: 19 19  Temp: 97.7 F (36.5 C) 98.4 F (36.9 C)  SpO2: 98% 95%   Gen: In bed, NAD Resp: non-labored breathing, no acute distress Abd: soft, nt  Neuro: MS: Awake, alert, oriented and appropriate. CN: Pupils equal round and reactive Motor: Limited due to right shoulder pain, otherwise 5/5 bilaterally. Sensory: Intact to light touch  Pertinent Labs: CMP-mildly low albumin CK-6000, increased from 3000 yesterday  Impression: 68 year old male with acute mental status change and fever of unclear etiology.  I think that simple delirium due to infection (?  Bronchitis) is less likely due to the rapidity with which he improved.  This still is one possibility, however.  I did not examine him while he was severely confused and therefore hyperreflexia is uncertain.  I think that CNS infection is very unlikely at this point, however without any other clear source of infection or confusion, I do think that obtaining the LP is still reasonable.  If no pleocytosis, antibiotics can be discontinued from this standpoint  He is not on any medications which would cause serotonin syndrome or neuroleptic malignant syndrome, and he is not aware of using any synthetic cannabinoids.  Recommendations: 1) LP for cells, glucose, protein, cultures 2) MRI brain 3) continue to trend CK, would favor hydration at this level. 4) urine synthetic cannabinoids.  Roland Rack, MD [facility] [phone]  If 7pm- 7am, please page neurology on call as listed in [place].

## 2019-12-20 ENCOUNTER — Inpatient Hospital Stay (HOSPITAL_COMMUNITY): Payer: Medicare Other

## 2019-12-20 DIAGNOSIS — J0140 Acute pansinusitis, unspecified: Secondary | ICD-10-CM

## 2019-12-20 DIAGNOSIS — J329 Chronic sinusitis, unspecified: Secondary | ICD-10-CM

## 2019-12-20 LAB — CBC
HCT: 40.1 % (ref 39.0–52.0)
Hemoglobin: 13.6 g/dL (ref 13.0–17.0)
MCH: 30.6 pg (ref 26.0–34.0)
MCHC: 33.9 g/dL (ref 30.0–36.0)
MCV: 90.1 fL (ref 80.0–100.0)
Platelets: 134 10*3/uL — ABNORMAL LOW (ref 150–400)
RBC: 4.45 MIL/uL (ref 4.22–5.81)
RDW: 13.3 % (ref 11.5–15.5)
WBC: 10.8 10*3/uL — ABNORMAL HIGH (ref 4.0–10.5)
nRBC: 0 % (ref 0.0–0.2)

## 2019-12-20 LAB — HSV DNA BY PCR (REFERENCE LAB)
HSV 1 DNA: NEGATIVE
HSV 2 DNA: NEGATIVE

## 2019-12-20 LAB — GLUCOSE, CAPILLARY
Glucose-Capillary: 126 mg/dL — ABNORMAL HIGH (ref 70–99)
Glucose-Capillary: 133 mg/dL — ABNORMAL HIGH (ref 70–99)
Glucose-Capillary: 151 mg/dL — ABNORMAL HIGH (ref 70–99)
Glucose-Capillary: 138 mg/dL — ABNORMAL HIGH (ref 70–99)

## 2019-12-20 LAB — CK: Total CK: 6213 U/L — ABNORMAL HIGH (ref 49–397)

## 2019-12-20 MED ORDER — AMOXICILLIN-POT CLAVULANATE ER 1000-62.5 MG PO TB12
2.0000 | ORAL_TABLET | Freq: Two times a day (BID) | ORAL | Status: DC
  Administered 2019-12-20 – 2019-12-21 (×3): 2 via ORAL
  Filled 2019-12-20 (×4): qty 2

## 2019-12-20 NOTE — Evaluation (Signed)
Physical Therapy Evaluation & Discharge Patient Details Name: Sergio Dominguez MRN: [id number] DOB: [birth year] Today's Date: 12/20/2019   History of Present Illness  Pt is a 68 y.o. male admitted 12/17/19 after wife found him laying on the floor and confused; pt feverish and agitated requiring sedation in ED. Workup concerning for encephalitis versus meningitis. Imaging shows multiple fxs of T4-6, L1-3. UDS (+) THC. S/p lumbar puncture 12/24; negative CSF excludes CNS infection. EEG suggests diffuse encephalopathy without seizure activity. Awaiting MRI. PMH includes DM.    Clinical Impression  Patient evaluated by Physical Therapy with no further acute PT needs identified. PTA, pt independent, drives and lives with wife. Today, pt able to ambulate without DME, but stability and pain improved with use of RW. Educ re: back precautions for comfort, fall risk reduction, LE therex and importance of mobility. Wife present throughout session and supportive. Discussed recommendation for Outpatient PT to address higher level balance deficits and back pain, pt would like to pursue this. All education has been completed and the patient has no further questions. Acute PT is signing off. Thank you for this referral.    Follow Up Recommendations Outpatient PT;Supervision - Intermittent    Equipment Recommendations  Rolling walker with 5" wheels;3in1 (PT)    Recommendations for Other Services       Precautions / Restrictions Precautions Precautions: Fall;Back Precaution Booklet Issued: Yes (comment) Precaution Comments: Back precautions for comfort due to compression fxs Restrictions Weight Bearing Restrictions: No      Mobility  Bed Mobility Overal bed mobility: Independent             General bed mobility comments: Independent with log roll, bed flat  Transfers Overall transfer level: Needs assistance Equipment used: None;Rolling walker (2 wheeled) Transfers: Sit to/from Stand Sit to  Stand: Supervision         General transfer comment: Stability improved with use of RW on second trial  Ambulation/Gait Ambulation/Gait assistance: Supervision Gait Distance (Feet): 200 Feet Assistive device: None;Rolling walker (2 wheeled) Gait Pattern/deviations: Step-through pattern;Decreased stride length;Trunk flexed   Gait velocity interpretation: 1.31 - 2.62 ft/sec, indicative of limited community ambulator General Gait Details: Ambulating in room with and without pushing IV pole at supervision-level but c/o back pain; stability and pain improved with use of RW, supervision due to fall risk  Stairs Stairs: (Pt declined stair training; discussed safe technique and guarding with pt and wife)          Wheelchair Mobility    Modified Rankin (Stroke Patients Only)       Balance Overall balance assessment: Needs assistance   Sitting balance-Leahy Scale: Good       Standing balance-Leahy Scale: Fair                               Pertinent Vitals/Pain Pain Assessment: Faces Faces Pain Scale: Hurts little more Pain Location: middle back Pain Descriptors / Indicators: Sore;Discomfort Pain Intervention(s): Monitored during session;Repositioned    Home Living Family/patient expects to be discharged to:: Private residence Living Arrangements: Spouse/significant other Available Help at Discharge: Family;Available PRN/intermittently Type of Home: House Home Access: Stairs to enter Entrance Stairs-Rails: Right Entrance Stairs-Number of Steps: 2-3 Home Layout: Two level;Able to live on main level with bedroom/bathroom Home Equipment: None      Prior Function Level of Independence: Independent               Hand Dominance  Extremity/Trunk Assessment   Upper Extremity Assessment Upper Extremity Assessment: Overall WFL for tasks assessed    Lower Extremity Assessment Lower Extremity Assessment: Overall WFL for tasks assessed        Communication   Communication: No difficulties  Cognition Arousal/Alertness: Awake/alert Behavior During Therapy: WFL for tasks assessed/performed Overall Cognitive Status: Within Functional Limits for tasks assessed                                 General Comments: Decreased safety awareness and decreased attention, wife reports nearly back to baseline cognition      General Comments General comments (skin integrity, edema, etc.): Wife present for education    Exercises General Exercises - Lower Extremity Long Arc Quad: AROM;Both;Seated Hip Flexion/Marching: AROM;Both;Seated Toe Raises: AROM;Both;Seated Heel Raises: AROM;Both;Seated   Assessment/Plan    PT Assessment All further PT needs can be met in the next venue of care  PT Problem List Decreased balance;Decreased mobility;Decreased knowledge of precautions;Pain       PT Treatment Interventions      PT Goals (Current goals can be found in the Care Plan section)  Acute Rehab PT Goals PT Goal Formulation: All assessment and education complete, DC therapy    Frequency     Barriers to discharge        Co-evaluation               AM-PAC PT "6 Clicks" Mobility  Outcome Measure Help needed turning from your back to your side while in a flat bed without using bedrails?: None Help needed moving from lying on your back to sitting on the side of a flat bed without using bedrails?: None Help needed moving to and from a bed to a chair (including a wheelchair)?: None Help needed standing up from a chair using your arms (e.g., wheelchair or bedside chair)?: None Help needed to walk in hospital room?: A Little Help needed climbing 3-5 steps with a railing? : A Little 6 Click Score: 22    End of Session   Activity Tolerance: Patient tolerated treatment well Patient left: in chair;with call bell/phone within reach;with family/visitor present Nurse Communication: Mobility status PT Visit Diagnosis:  Other abnormalities of gait and mobility (R26.89)    Time: 0110-0349 PT Time Calculation (min) (ACUTE ONLY): 26 min   Charges:   PT Evaluation $PT Eval Moderate Complexity: 1 Mod PT Treatments $Gait Training: 8-22 mins      Mabeline Caras, PT, DPT Acute Rehabilitation Services  Pager [phone] Office [phone]  Derry Lory 12/20/2019, 5:12 PM

## 2019-12-20 NOTE — Progress Notes (Signed)
Patient is doing well, he is cognitively intact and appropriate.   His rapid improvement and lack of other findings as well as negative CSF excludes CNS infection.  We can stop antibiotics for this.  His CK continues to be elevated, I wonder if it is from being down for a period of time.  It appears to have plateaued, but not yet downtrending.  Agree with holding statin, hydration and observation until it is and downtrending.  At this point, I suspect that this probably represented delirium secondary to his sinusitis. With him back to normal, I don't think further neurodiagnostic testing is likely to be of use.   Please call with further questions or concerns.   Roland Rack, MD [facility] [phone]  If 7pm- 7am, please page neurology on call as listed in [place].

## 2019-12-20 NOTE — Progress Notes (Signed)
[facility]  PROGRESS NOTE  Sergio Dominguez XBM:[id number] DOB: [birth year] DOA: 12/17/2019 PCP: Tracey Harries, MD Admit date - 12/17/2019   Admitting Physician Eduard Clos, MD  Outpatient Primary MD for the patient is Tracey Harries, MD  LOS - 2 Brief Narrative   Sergio Dominguez is a 68 y.o. year old male with medical history significant for type 2 diabetes, HLD who presented on 12/17/2019 with increased confusion in 24 hours reported by wife.  And was admitted with working diagnosis of altered mental status with fever concerning for encephalitis versus meningitis.  ED course notable for T-max of 101, chest x-ray showing possible infiltrates, CTA chest and abdomen nonacute except for multiple fractures in thoracic and lumbar spine of unclear acuity.  Patient empirically started on IV ceftriaxone, acyclovir, vancomycin, ampicillin, unable to obtain LP in ED due to confusion, unable to obtain fluoroscopic guided LP on 12/23 due to agitation.  Lab work-up notable so far for negative salicylates, acetaminophen, LFTs, ammonia. CK noted to be 1037 on admission.  UDS positive for THC.  CT head nonacute  History provided by wife on 12/23  She left him 730 am to go to work She last spoke with him 4:15 yesterday am He took his son to work with no issues She called around 27 and he didn't answer which is not unusual but he didn't call back. Around 3 pm she went to check on him at the house and found the front door ajar found him lying on the floor upstairs in the bedroom "semi-conscious" She yelled his name and he seemed dazed. For all her questions he answered "I don't know, where am I?" She noticed his underwear had urine on it.  She thinks he fell twice because of how disshelved the downstairs looked   His baseline is independent. No history of seizures. No new medications.  He had been complaining of neck pain for the past month. They thought it was MSK    Subjective  Mr.  Hallisey  today is completely back to his mental status baseline. His soreness in his back is only reproducible when he lifts his leg  In talk in the evening reports pleuritic chest pain and back pain in thoracic spine. No SOB.  A & P   Fever with increased confusion, secondary to severe sinusitis? LP unremarkable. Mental status cleared fairly quickly. Meningitis/encephalitis ruled out. concerning for infectious encephalitis, resolved.  MRI non acute except for severe sinusitis  Other workup has been negative so far, concern for potential withdrawal from substance given positive UDS for THC(he states he doesn't recall having marijuana prior to the episode. On no other contributing medications. EEG suggested diffuse encephalopathy without seizure activityu  - dc empiric IV vancomycin, ampicillin, acyclovir, ceftriaxone -Start hid -MRI brain to evaluate for intracranial normalities -Neurochecks, monitor for withdrawal symptoms -Judicious use of IV Haldol/Ativan for agitation  Leukocytosis. Elevated to 21 on admission and now downtrending. Diffuse airway thickening and secretions on CT chest without evidence of cosolidation or effusion.  -trend blood cultures - on empiric antibiotics  Lactic acidosis, downtrending Peak of 3.1, down to 2.7. BP is normotensive -Continue IVF  Rhabdomyolysis with myoglobinuria in setting of fall, still increasing -trend CK-uptrending--now 6000 this am, repeat till downtrending -given improvement in mental status encourage hydration and timed IVF -Holding home statins  Multiple vertebral fractures of T4-T6 and L1-L3, L5 on CT imaging Still moving extremities without notable deficits on my exam.   MRI lumbar confirms acute/subacute  No  neurologic deficit --Need MRI thoracic spine too given thoracic complaints and areas of concern on previous CT imaging --discussed with Dr. Franky Macho with neurosurgery would need aspen lumbar with thoracic extension based on MRI  findings --f/u with Dr. Franky Macho in 2 weeks as outpatient  --PT eval and treat -Continue to monitor  Type 2 diabetes, A1c 7.8% CBGs at goal -diabetic diet -Monitor CBG sliding scale as needed   Family Communication  :  Updated wife via phone on 12/26  Code Status :  FULL  Disposition Plan  : needs MRI thorax to determine if needs thoracic extension of back brace prior to discharge on 12/26  Consults  :  IR, Neurology  Procedures  : EEG 12/23, LP under fluoroscopy 12/24  DVT Prophylaxis  : SCDs  Lab Results  Component Value Date   PLT 134 (L) 12/20/2019    Diet :  Diet Order            Diet Carb Modified Fluid consistency: Thin; Room service appropriate? Yes  Diet effective now               Inpatient Medications Scheduled Meds: . amoxicillin-clavulanate  2 tablet Oral Q12H  . Chlorhexidine Gluconate Cloth  6 each Topical Daily  . insulin aspart  0-9 Units Subcutaneous TID WC   Continuous Infusions:  PRN Meds:.acetaminophen **OR** acetaminophen, ondansetron **OR** ondansetron (ZOFRAN) IV  Antibiotics  :   Anti-infectives (From admission, onward)   Start     Dose/Rate Route Frequency Ordered Stop   12/20/19 1045  amoxicillin-clavulanate (AUGMENTIN XR) 1000-62.5 MG per 12 hr tablet 2 tablet     2 tablet Oral Every 12 hours 12/20/19 1033 12/27/19 0959   12/18/19 2300  vancomycin (VANCOREADY) IVPB 750 mg/150 mL  Status:  Discontinued     750 mg 150 mL/hr over 60 Minutes Intravenous Every 12 hours 12/18/19 1403 12/20/19 0753   12/18/19 1000  vancomycin (VANCOREADY) IVPB 750 mg/150 mL  Status:  Discontinued     750 mg 150 mL/hr over 60 Minutes Intravenous Every 12 hours 12/18/19 0412 12/18/19 1403   12/18/19 0445  acyclovir (ZOVIRAX) 670 mg in dextrose 5 % 100 mL IVPB  Status:  Discontinued     10 mg/kg  67 kg 113.4 mL/hr over 60 Minutes Intravenous Every 8 hours 12/18/19 0412 12/20/19 1026   12/18/19 0415  cefTRIAXone (ROCEPHIN) 2 g in sodium chloride 0.9 % 100  mL IVPB  Status:  Discontinued     2 g 200 mL/hr over 30 Minutes Intravenous Every 12 hours 12/18/19 0409 12/20/19 0753   12/18/19 0415  ampicillin (OMNIPEN) 2 g in sodium chloride 0.9 % 100 mL IVPB  Status:  Discontinued     2 g 300 mL/hr over 20 Minutes Intravenous Every 4 hours 12/18/19 0409 12/20/19 0753   12/18/19 0200  vancomycin (VANCOREADY) IVPB 1250 mg/250 mL     1,250 mg 166.7 mL/hr over 90 Minutes Intravenous  Once 12/18/19 0136 12/18/19 0424   12/17/19 2030  cefTRIAXone (ROCEPHIN) 1 g in sodium chloride 0.9 % 100 mL IVPB     1 g 200 mL/hr over 30 Minutes Intravenous  Once 12/17/19 2028 12/17/19 2151   12/17/19 2030  azithromycin (ZITHROMAX) 500 mg in sodium chloride 0.9 % 250 mL IVPB     500 mg 250 mL/hr over 60 Minutes Intravenous  Once 12/17/19 2028 12/18/19 0000       Objective   Vitals:   12/19/19 2000 12/19/19 2337 12/20/19 0315 12/20/19  0800  BP: 139/79 (!) 142/78 130/76 125/75  Pulse: 73 81 74 83  Resp: 19 (!) 21 (!) 22 16  Temp: 98.6 F (37 C) 98.7 F (37.1 C) 98.8 F (37.1 C) 98.5 F (36.9 C)  TempSrc: Oral Oral Oral Oral  SpO2: 97% 96% 97% 98%  Weight:      Height:        SpO2: 98 %  Wt Readings from Last 3 Encounters:  12/17/19 67 kg     Intake/Output Summary (Last 24 hours) at 12/20/2019 1055 Last data filed at 12/20/2019 0532 Gross per 24 hour  Intake 2156.98 ml  Output 2150 ml  Net 6.98 ml    Physical Exam:  Awake Alert, oriented to self, person, place, time Able to follow commands without deficits and good strength in all extremities normal oral mucosa No paraspinal/spinal tenderness with palpation [place].AT Symmetrical Chest wall movement, Good air movement bilaterally, CTAB RRR,No Gallops,Rubs or new Murmurs,  +ve B.Sounds, Abd Soft, No tenderness,  No rebound, guarding or rigidity. No Cyanosis, Clubbing or edema, No new Rash or bruise     I have personally reviewed the following:   Data Reviewed:  CBC Recent Labs  Lab  12/17/19 1822 12/17/19 1827 12/18/19 0502 12/19/19 0208 12/20/19 0241  WBC  --  20.9* 21.1* 13.4* 10.8*  HGB 15.3 15.3 14.1 12.8* 13.6  HCT 45.0 46.4 42.6 38.9* 40.1  PLT  --  178 161 151 134*  MCV  --  93.5 92.8 92.8 90.1  MCH  --  30.8 30.7 30.5 30.6  MCHC  --  33.0 33.1 32.9 33.9  RDW  --  14.1 14.2 13.9 13.3  LYMPHSABS  --  0.3* 1.1  --   --   MONOABS  --  0.8 1.3*  --   --   EOSABS  --  0.0 0.0  --   --   BASOSABS  --  0.0 0.0  --   --     Chemistries  Recent Labs  Lab 12/17/19 1822 12/17/19 1827 12/18/19 0502 12/19/19 0208  NA 138 137 139 139  K 4.2 3.8 4.0 3.8  CL 107 104 107 108  CO2  --  20* 20* 21*  GLUCOSE 208* 217* 152* 118*  BUN 13 10 9 10   CREATININE 0.70 1.02 0.77 0.88  CALCIUM  --  9.0 8.7* 8.4*  MG  --  1.9 1.8  --   AST  --  36 59* 72*  ALT  --  33 34 32  ALKPHOS  --  86 79 61  BILITOT  --  1.1 1.1 1.3*   ------------------------------------------------------------------------------------------------------------------ No results for input(s): CHOL, HDL, LDLCALC, TRIG, CHOLHDL, LDLDIRECT in the last 72 hours.  Lab Results  Component Value Date   HGBA1C 7.8 (H) 12/18/2019   ------------------------------------------------------------------------------------------------------------------ Recent Labs    12/18/19 0502  TSH 1.699   ------------------------------------------------------------------------------------------------------------------ No results for input(s): VITAMINB12, FOLATE, FERRITIN, TIBC, IRON, RETICCTPCT in the last 72 hours.  Coagulation profile Recent Labs  Lab 12/17/19 1827  INR 1.2    No results for input(s): DDIMER in the last 72 hours.  Cardiac Enzymes No results for input(s): CKMB, TROPONINI, MYOGLOBIN in the last 168 hours.  Invalid input(s): CK ------------------------------------------------------------------------------------------------------------------ No results found for: BNP  Micro Results Recent  Results (from the past 240 hour(s))  Urine culture     Status: None   Collection Time: 12/17/19  8:15 PM   Specimen: Urine, Random  Result Value Ref Range Status  Specimen Description URINE, RANDOM  Final   Special Requests NONE  Final   Culture   Final    NO GROWTH Performed at [facility], [address], [place], [place] [address]    Report Status 12/19/2019 FINAL  Final  Culture, blood (routine x 2)     Status: None (Preliminary result)   Collection Time: 12/17/19  8:28 PM   Specimen: BLOOD  Result Value Ref Range Status   Specimen Description BLOOD RIGHT ARM  Final   Special Requests   Final    BOTTLES DRAWN AEROBIC AND ANAEROBIC Blood Culture results may not be optimal due to an inadequate volume of blood received in culture bottles   Culture   Final    NO GROWTH 3 DAYS Performed at [facility], [address], [place], [place] [address]    Report Status PENDING  Incomplete  Culture, blood (routine x 2)     Status: None (Preliminary result)   Collection Time: 12/17/19  8:55 PM   Specimen: BLOOD  Result Value Ref Range Status   Specimen Description BLOOD LEFT ARM  Final   Special Requests   Final    BOTTLES DRAWN AEROBIC AND ANAEROBIC Blood Culture results may not be optimal due to an inadequate volume of blood received in culture bottles   Culture   Final    NO GROWTH 3 DAYS Performed at [facility], [address], [place], [place] [address]    Report Status PENDING  Incomplete  SARS CORONAVIRUS 2 (TAT 6-24 HRS) Nasopharyngeal Nasopharyngeal Swab     Status: None   Collection Time: 12/17/19  9:52 PM   Specimen: Nasopharyngeal Swab  Result Value Ref Range Status   SARS Coronavirus 2 NEGATIVE NEGATIVE Final    Comment: (NOTE) SARS-CoV-2 target nucleic acids are NOT DETECTED. The SARS-CoV-2 RNA is generally detectable in upper and lower respiratory specimens during the acute phase of infection. Negative results do not preclude SARS-CoV-2  infection, do not rule out co-infections with other pathogens, and should not be used as the sole basis for treatment or other patient management decisions. Negative results must be combined with clinical observations, patient history, and epidemiological information. The expected result is Negative. Fact Sheet for Patients: HairSlick.no Fact Sheet for Healthcare Providers: quierodirigir.com This test is not yet approved or cleared by the [place] FDA and  has been authorized for detection and/or diagnosis of SARS-CoV-2 by FDA under an Emergency Use Authorization (EUA). This EUA will remain  in effect (meaning this test can be used) for the duration of the COVID-19 declaration under Section 56 4(b)(1) of the Act, 21 U.S.C. section 360bbb-3(b)(1), unless the authorization is terminated or revoked sooner. Performed at [facility], [address] [place], [place] [address]   MRSA PCR Screening     Status: None   Collection Time: 12/18/19  5:55 AM   Specimen: Nasopharyngeal  Result Value Ref Range Status   MRSA by PCR NEGATIVE NEGATIVE Final    Comment:        The GeneXpert MRSA Assay (FDA approved for NASAL specimens only), is one component of a comprehensive MRSA colonization surveillance program. It is not intended to diagnose MRSA infection nor to guide or monitor treatment for MRSA infections. Performed at [facility], [address], [place], [place] [address]   CSF culture     Status: None (Preliminary result)   Collection Time: 12/19/19 10:34 AM   Specimen:  PATH Cytology CSF; Cerebrospinal Fluid  Result Value Ref Range Status   Specimen Description CSF  Final   Special Requests NONE  Final   Gram Stain NO WBC SEEN NO ORGANISMS SEEN CYTOSPIN SMEAR   Final   Culture   Final    NO GROWTH < 24 HOURS Performed at [facility], [address] [place], [place] [address]    Report Status  PENDING  Incomplete    Radiology Reports CT Head Wo Contrast  Result Date: 12/17/2019 CLINICAL DATA:  Altered mental status. Episode of unresponsiveness today. EXAM: CT HEAD WITHOUT CONTRAST CT CERVICAL SPINE WITHOUT CONTRAST TECHNIQUE: Multidetector CT imaging of the head and cervical spine was performed following the standard protocol without intravenous contrast. Multiplanar CT image reconstructions of the cervical spine were also generated. COMPARISON:  None. FINDINGS: CT HEAD FINDINGS Brain: No evidence of acute infarction, hemorrhage, hydrocephalus, extra-axial collection or mass lesion/mass effect. Vascular: No hyperdense vessel or unexpected calcification. Skull: Intact.  No focal lesion. Sinuses/Orbits: Scattered ethmoid air cell disease is seen. Small mucous retention cyst or polyp right maxillary sinus noted. Other: None. CT CERVICAL SPINE FINDINGS Alignment: Maintained. Skull base and vertebrae: No acute fracture. No primary bone lesion or focal pathologic process. Soft tissues and spinal canal: No prevertebral fluid or swelling. No visible canal hematoma. Disc levels: Very mild loss of disc space height and endplate spurring are seen at C3-4 and C5-6. Upper chest: Emphysematous disease is noted.  Lung apices are clear. Other: None. IMPRESSION: No acute abnormality head or cervical spine. Mild cervical degenerative change. Emphysema. Electronically Signed   By: Drusilla Kanner M.D.   On: 12/17/2019 18:13   CT Angio Chest PE W/Cm &/Or Wo Cm  Result Date: 12/18/2019 CLINICAL DATA:  Altered mental status, unresponsive on EMS arrival, agitation abdominal abscess, infection suspected EXAM: CT ANGIOGRAPHY CHEST CT ABDOMEN AND PELVIS WITH CONTRAST TECHNIQUE: Multidetector CT imaging of the chest was performed using the standard protocol during bolus administration of intravenous contrast. Multiplanar CT image reconstructions and MIPs were obtained to evaluate the vascular anatomy. Multidetector CT  imaging of the abdomen and pelvis was performed using the standard protocol during bolus administration of intravenous contrast. CONTRAST:  100 cc OMNIPAQUE IOHEXOL 350 MG/ML SOLN COMPARISON:  Chest radiograph 12/17/2019 FINDINGS: CTA CHEST FINDINGS Cardiovascular: Satisfactory opacification the pulmonary arteries to the segmental level. No pulmonary artery filling defects are identified. Central pulmonary arteries are normal caliber. The aorta is normal caliber. Normal 3 vessel branching of the aortic arch. Normal heart size. No pericardial effusion. Mediastinum/Nodes: No mediastinal free fluid or air. Scattered secretions seen in the trachea and central airways. No acute abnormality of the esophagus. Thyroid gland and thoracic inlet are unremarkable. No visible mediastinal, hilar or axillary adenopathy. Lungs/Pleura: Centrilobular and paraseptal emphysematous changes are noted diffusely. There is mild diffuse airways thickening and scattered secretions. Atelectatic changes are present dependently. No consolidation, features of edema, pneumothorax, or effusion. No suspicious pulmonary nodules or masses. Musculoskeletal: Age-indeterminate anterior wedging compression deformities at T4, T5 and T6 with approximately 20-30% height loss anteriorly (AOSpine A1). Milder compression deformity suspected at the superior T9 endplate as well with approximately 5% height loss. No extension into the posterior elements or disruption of the posterior tension band. Background of multilevel discogenic and facet degenerative changes in the spine. Review of the MIP images confirms the above findings. CT ABDOMEN and PELVIS FINDINGS Hepatobiliary: 1.3 cm fluid attenuation cyst in the left lobe liver. No worrisome hepatic lesion. Normal  gallbladder. No gallbladder wall thickening or pericholecystic fluid. No visible calcified gallstones or biliary ductal dilatation. Pancreas: Unremarkable. No pancreatic ductal dilatation or surrounding  inflammatory changes. Spleen: Normal in size without focal abnormality. Adrenals/Urinary Tract: Normal adrenal glands. 2.2 cm fluid attenuation cysts seen in the anterior upper pole right kidney. No concerning focal renal lesion. No urolithiasis or hydronephrosis. Difficult to assess the ureters given extensive patient motion artifact. Mild circumferential bladder wall thickening is noted. Indentation of the posterior bladder by the borderline enlarged prostate Stomach/Bowel: Bowel assessment is complicated by extensive patient motion artifact throughout the abdomen and pelvis. Distal esophagus, stomach and duodenal sweep are unremarkable. No small bowel wall thickening or dilatation. No evidence of obstruction. Air-filled noninflamed appendix seen at the cecal tip. No colonic dilatation or wall thickening. Vascular/Lymphatic: Atherosclerotic plaque within the normal caliber aorta. Difficult assessment of the lymph nodes in the abdomen and pelvis secondary to a paucity of intraperitoneal fat and patient motion artifact. No grossly enlarged nodes are seen. Reproductive: Mild prostatomegaly with indentation of the bladder base. Seminal vesicles are unremarkable. Other: No free fluid or convincing evidence of free air. Evaluation limited given motion artifact. No bowel containing hernias. Paucity of subcutaneous fat. No body wall hematoma or traumatic abdominal wall injury. Mild body wall edema. Musculoskeletal: There are incomplete burst fractures involving the superior endplates L1, L2 and L3 with approximately 20-40% height loss at these levels (AOSpine A3). Minimal retropulsion of less than 2 mm at L2 and L3. No severe canal stenosis. Additional anterior wedging compression deformity L5 30% height loss maximally (AOSpine A1). No extension into the posterior elements or disruption of the posterior tension band. No other acute osseous injury is identified. Moderate to severe degenerative changes noted in both hips.  Review of the MIP images confirms the above findings. IMPRESSION: 1. Imaging quality is significantly degraded due to patient motion artifact most severe throughout the abdomen and pelvis. 2. No evidence of acute pulmonary embolus. 3. Anterior wedging compression deformities at T4, T5, T6, and L5 (AOSpine A1). Incomplete burst fractures of the superior endplates of L1, L2 and L3 (AOSpine A3). Correlate for point tenderness at the site of multiple vertebral fractures to assess for acuity. No severe resulting canal stenosis or foraminal narrowing. No extension into the posterior elements or disruption of the posterior tension band. 4. Mild circumferential bladder wall thickening, with prostatomegaly finding may be secondary to chronic outlet obstruction. However recommend correlation with urinalysis to exclude cystitis. 5. Prostatomegaly with indentation of the bladder base. 6.  Aortic Atherosclerosis (ICD10-I70.0). 7.  Emphysema (ICD10-J43.9). These results were called by telephone at the time of interpretation on 12/18/2019 at 1:33 am to provider Dr. Clayborne Dana, who verbally acknowledged these results. Electronically Signed   By: Kreg Shropshire M.D.   On: 12/18/2019 01:34   CT Cervical Spine Wo Contrast  Result Date: 12/17/2019 CLINICAL DATA:  Altered mental status. Episode of unresponsiveness today. EXAM: CT HEAD WITHOUT CONTRAST CT CERVICAL SPINE WITHOUT CONTRAST TECHNIQUE: Multidetector CT imaging of the head and cervical spine was performed following the standard protocol without intravenous contrast. Multiplanar CT image reconstructions of the cervical spine were also generated. COMPARISON:  None. FINDINGS: CT HEAD FINDINGS Brain: No evidence of acute infarction, hemorrhage, hydrocephalus, extra-axial collection or mass lesion/mass effect. Vascular: No hyperdense vessel or unexpected calcification. Skull: Intact.  No focal lesion. Sinuses/Orbits: Scattered ethmoid air cell disease is seen. Small mucous retention  cyst or polyp right maxillary sinus noted. Other: None. CT CERVICAL SPINE FINDINGS  Alignment: Maintained. Skull base and vertebrae: No acute fracture. No primary bone lesion or focal pathologic process. Soft tissues and spinal canal: No prevertebral fluid or swelling. No visible canal hematoma. Disc levels: Very mild loss of disc space height and endplate spurring are seen at C3-4 and C5-6. Upper chest: Emphysematous disease is noted.  Lung apices are clear. Other: None. IMPRESSION: No acute abnormality head or cervical spine. Mild cervical degenerative change. Emphysema. Electronically Signed   By: Inge Rise M.D.   On: 12/17/2019 18:13   MR BRAIN W WO CONTRAST  Result Date: 12/19/2019 CLINICAL DATA:  Encephalopathy. Mental status changes and fever. EXAM: MRI HEAD WITHOUT AND WITH CONTRAST TECHNIQUE: Multiplanar, multiecho pulse sequences of the brain and surrounding structures were obtained without and with intravenous contrast. CONTRAST:  6.60mL GADAVIST GADOBUTROL 1 MMOL/ML IV SOLN COMPARISON:  CT head without contrast 12/17/2019 FINDINGS: Brain: No acute infarct, hemorrhage, or mass lesion is present. Atrophy and white matter changes are mildly advanced for age. Remote lacunar infarct is present in the right caudate head. White matter changes extend into the brainstem. The ventricles are of normal size. No significant extraaxial fluid collection is present. Postcontrast images demonstrate no pathologic enhancement Vascular: Flow is present in the major intracranial arteries. Skull and upper cervical spine: The craniocervical junction is normal. Upper cervical spine is within normal limits. Marrow signal is unremarkable. Sinuses/Orbits: A polyp or mucous retention cyst is present within the medial right maxillary sinus. Fluid levels are present in the ethmoid air cells. There is diffuse mucosal thickening and opacification throughout the ethmoid air cells. Prominent mucosal thickening is present in the  frontal sinuses bilaterally. The mastoid air cells are clear. The globes and orbits are within normal limits. IMPRESSION: 1. No acute or focal abnormality to explain the patient's symptoms. 2. Atrophy and white matter disease is mildly advanced for age. This likely reflects the sequela of chronic microvascular ischemia. 3. Extensive sinus disease. Fluid levels in the sphenoid sinuses bilaterally raise concern for acute sinusitis Electronically Signed   By: San Morelle M.D.   On: 12/19/2019 15:53   CT Abdomen Pelvis W Contrast  Result Date: 12/18/2019 CLINICAL DATA:  Altered mental status, unresponsive on EMS arrival, agitation abdominal abscess, infection suspected EXAM: CT ANGIOGRAPHY CHEST CT ABDOMEN AND PELVIS WITH CONTRAST TECHNIQUE: Multidetector CT imaging of the chest was performed using the standard protocol during bolus administration of intravenous contrast. Multiplanar CT image reconstructions and MIPs were obtained to evaluate the vascular anatomy. Multidetector CT imaging of the abdomen and pelvis was performed using the standard protocol during bolus administration of intravenous contrast. CONTRAST:  100 cc OMNIPAQUE IOHEXOL 350 MG/ML SOLN COMPARISON:  Chest radiograph 12/17/2019 FINDINGS: CTA CHEST FINDINGS Cardiovascular: Satisfactory opacification the pulmonary arteries to the segmental level. No pulmonary artery filling defects are identified. Central pulmonary arteries are normal caliber. The aorta is normal caliber. Normal 3 vessel branching of the aortic arch. Normal heart size. No pericardial effusion. Mediastinum/Nodes: No mediastinal free fluid or air. Scattered secretions seen in the trachea and central airways. No acute abnormality of the esophagus. Thyroid gland and thoracic inlet are unremarkable. No visible mediastinal, hilar or axillary adenopathy. Lungs/Pleura: Centrilobular and paraseptal emphysematous changes are noted diffusely. There is mild diffuse airways thickening  and scattered secretions. Atelectatic changes are present dependently. No consolidation, features of edema, pneumothorax, or effusion. No suspicious pulmonary nodules or masses. Musculoskeletal: Age-indeterminate anterior wedging compression deformities at T4, T5 and T6 with approximately 20-30% height loss anteriorly (AOSpine  A1). Milder compression deformity suspected at the superior T9 endplate as well with approximately 5% height loss. No extension into the posterior elements or disruption of the posterior tension band. Background of multilevel discogenic and facet degenerative changes in the spine. Review of the MIP images confirms the above findings. CT ABDOMEN and PELVIS FINDINGS Hepatobiliary: 1.3 cm fluid attenuation cyst in the left lobe liver. No worrisome hepatic lesion. Normal gallbladder. No gallbladder wall thickening or pericholecystic fluid. No visible calcified gallstones or biliary ductal dilatation. Pancreas: Unremarkable. No pancreatic ductal dilatation or surrounding inflammatory changes. Spleen: Normal in size without focal abnormality. Adrenals/Urinary Tract: Normal adrenal glands. 2.2 cm fluid attenuation cysts seen in the anterior upper pole right kidney. No concerning focal renal lesion. No urolithiasis or hydronephrosis. Difficult to assess the ureters given extensive patient motion artifact. Mild circumferential bladder wall thickening is noted. Indentation of the posterior bladder by the borderline enlarged prostate Stomach/Bowel: Bowel assessment is complicated by extensive patient motion artifact throughout the abdomen and pelvis. Distal esophagus, stomach and duodenal sweep are unremarkable. No small bowel wall thickening or dilatation. No evidence of obstruction. Air-filled noninflamed appendix seen at the cecal tip. No colonic dilatation or wall thickening. Vascular/Lymphatic: Atherosclerotic plaque within the normal caliber aorta. Difficult assessment of the lymph nodes in the  abdomen and pelvis secondary to a paucity of intraperitoneal fat and patient motion artifact. No grossly enlarged nodes are seen. Reproductive: Mild prostatomegaly with indentation of the bladder base. Seminal vesicles are unremarkable. Other: No free fluid or convincing evidence of free air. Evaluation limited given motion artifact. No bowel containing hernias. Paucity of subcutaneous fat. No body wall hematoma or traumatic abdominal wall injury. Mild body wall edema. Musculoskeletal: There are incomplete burst fractures involving the superior endplates L1, L2 and L3 with approximately 20-40% height loss at these levels (AOSpine A3). Minimal retropulsion of less than 2 mm at L2 and L3. No severe canal stenosis. Additional anterior wedging compression deformity L5 30% height loss maximally (AOSpine A1). No extension into the posterior elements or disruption of the posterior tension band. No other acute osseous injury is identified. Moderate to severe degenerative changes noted in both hips. Review of the MIP images confirms the above findings. IMPRESSION: 1. Imaging quality is significantly degraded due to patient motion artifact most severe throughout the abdomen and pelvis. 2. No evidence of acute pulmonary embolus. 3. Anterior wedging compression deformities at T4, T5, T6, and L5 (AOSpine A1). Incomplete burst fractures of the superior endplates of L1, L2 and L3 (AOSpine A3). Correlate for point tenderness at the site of multiple vertebral fractures to assess for acuity. No severe resulting canal stenosis or foraminal narrowing. No extension into the posterior elements or disruption of the posterior tension band. 4. Mild circumferential bladder wall thickening, with prostatomegaly finding may be secondary to chronic outlet obstruction. However recommend correlation with urinalysis to exclude cystitis. 5. Prostatomegaly with indentation of the bladder base. 6.  Aortic Atherosclerosis (ICD10-I70.0). 7.  Emphysema  (ICD10-J43.9). These results were called by telephone at the time of interpretation on 12/18/2019 at 1:33 am to provider Dr. Clayborne DanaMesner, who verbally acknowledged these results. Electronically Signed   By: Kreg ShropshirePrice  DeHay M.D.   On: 12/18/2019 01:34   DG Chest Port 1 View  Result Date: 12/17/2019 CLINICAL DATA:  Mental status changes EXAM: PORTABLE CHEST 1 VIEW COMPARISON:  Portable exam 1937 hours without priors for comparison FINDINGS: Jewelry artifact projects over LEFT upper lobe. Normal heart size, mediastinal contours, and pulmonary vascularity. Peribronchial  thickening with atelectasis versus infiltrate at medial LEFT lower lobe. Prominent first costochondral junctions bilaterally. Remaining lungs clear. No pleural effusion or pneumothorax. Bones demineralized. IMPRESSION: Bronchitic changes with atelectasis versus infiltrate in retrocardiac LEFT lower lobe. Electronically Signed   By: Ulyses Southward M.D.   On: 12/17/2019 19:54   EEG adult  Result Date: 12/18/2019 Charlsie Quest, MD     12/18/2019  1:01 PM Patient Name: Macdonald Rigor MRN: [id number] Epilepsy Attending: Charlsie Quest Referring Physician/Provider: Dr. Garrison [place] Date: 12/18/2019 Duration: 23.58 minutes Patient history: 68 year old male with altered mental status.  EEG to evaluate for seizures. Level of alertness: Awake AEDs during EEG study: Lorazepam Technical aspects: This EEG study was done with scalp electrodes positioned according to the 10-20 International system of electrode placement. Electrical activity was acquired at a sampling rate of 500Hz  and reviewed with a high frequency filter of 70Hz  and a low frequency filter of 1Hz . EEG data were recorded continuously and digitally stored. Description: During awake state, no clear posterior dominant rhythm was seen.  EEG showed global generalized 2 to 5 Hz theta-delta slowing admixed with 13 to 15 Hz generalized beta activity.  Of note, EEG was at times difficult to interpret  secondary to significant myogenic artifact.  Hyperventilation photic summation were not performed. Abnormality -Continuous slow, generalized -Excessive beta, generalized IMPRESSION: This technically difficult study suggestive of mild to moderate diffuse encephalopathy, nonspecific etiology. The excessive beta activity seen in the background is most likely due to the effect of benzodiazepine and is a benign EEG pattern. No seizures or epileptiform discharges were seen throughout the recording. Priyanka Annabelle Harman   DG FL GUIDED LUMBAR PUNCTURE  Result Date: 12/19/2019 CLINICAL DATA:  Encephalopathy, possible meningitis EXAM: DIAGNOSTIC LUMBAR PUNCTURE UNDER FLUOROSCOPIC GUIDANCE FLUOROSCOPY TIME:  Fluoroscopy Time:  0 minutes, 18 seconds Radiation Exposure Index (if provided by the fluoroscopic device): 1.8 mGy Number of Acquired Spot Images: 0 PROCEDURE: I discussed the risks (including hemorrhage, infection, and nerve damage, among others), benefits, and alternatives to fluoroscopically guided lumbar puncture with patient's wife by telephone, as the patient is unable to consent and has altered mental status. We specifically discussed the high likelihood of technical success of the procedure. The patient's wife understood and elected for the patient undergo the procedure. Standard time-out was employed. Following sterile skin prep and local anesthetic administration consisting of 1 percent lidocaine, a 20 gauge spinal needle was advanced without difficulty into the thecal sac at the L5-S1 level under fluoroscopic guidance. Clear CSF was returned. A pressure measurement obtained with patient prone demonstrated CSF pressure of 16 cm of water. A total of 12 cc of clear CSF was collected in 4 vials. The needle was subsequently removed and the skin cleansed and bandaged. No immediate complications were observed. IMPRESSION: 1. Technically successful fluoroscopically guided lumbar puncture yielding 12 cc of clear CSF.  Electronically Signed   By: Gaylyn Rong M.D.   On: 12/19/2019 10:45     Time Spent in minutes  30     Laverna Peace M.D on 12/20/2019 at 10:55 AM  To page go to [email or web address] - password [facility]

## 2019-12-21 ENCOUNTER — Inpatient Hospital Stay (HOSPITAL_COMMUNITY): Payer: Medicare Other

## 2019-12-21 DIAGNOSIS — R4182 Altered mental status, unspecified: Secondary | ICD-10-CM

## 2019-12-21 DIAGNOSIS — R509 Fever, unspecified: Secondary | ICD-10-CM

## 2019-12-21 LAB — GLUCOSE, CAPILLARY
Glucose-Capillary: 145 mg/dL — ABNORMAL HIGH (ref 70–99)
Glucose-Capillary: 150 mg/dL — ABNORMAL HIGH (ref 70–99)
Glucose-Capillary: 152 mg/dL — ABNORMAL HIGH (ref 70–99)
Glucose-Capillary: 174 mg/dL — ABNORMAL HIGH (ref 70–99)

## 2019-12-21 LAB — CBC
HCT: 42.5 % (ref 39.0–52.0)
Hemoglobin: 14.3 g/dL (ref 13.0–17.0)
MCH: 31 pg (ref 26.0–34.0)
MCHC: 33.6 g/dL (ref 30.0–36.0)
MCV: 92 fL (ref 80.0–100.0)
Platelets: 158 10*3/uL (ref 150–400)
RBC: 4.62 MIL/uL (ref 4.22–5.81)
RDW: 13.2 % (ref 11.5–15.5)
WBC: 9 10*3/uL (ref 4.0–10.5)
nRBC: 0 % (ref 0.0–0.2)

## 2019-12-21 LAB — CK: Total CK: 3372 U/L — ABNORMAL HIGH (ref 49–397)

## 2019-12-21 MED ORDER — TRAMADOL HCL 50 MG PO TABS: 50.0000 mg | ORAL_TABLET | Freq: Four times a day (QID) | ORAL | 0 refills | Status: AC | PRN

## 2019-12-21 MED ORDER — AMOXICILLIN-POT CLAVULANATE ER 1000-62.5 MG PO TB12: 2.0000 | ORAL_TABLET | Freq: Two times a day (BID) | ORAL | 0 refills | Status: AC

## 2019-12-21 NOTE — Progress Notes (Signed)
Orthopedic Tech Progress Note Patient Details:  Sergio Dominguez 1951/03/04 [id number]  Patient ID: Sergio Dominguez, male   DOB: 10-06-1951, 68 y.o.   MRN: [id number]   Sergio Dominguez 12/21/2019, 11:07 AMCalled [place] for brace.

## 2019-12-21 NOTE — TOC Transition Note (Signed)
Transition of Care [facility]) - CM/SW Discharge Note   Patient Details  Name: Sergio Dominguez MRN: [id number] Date of Birth: 06/05/1951  [facility] CM/SW Contact:  Claudie Leach, RN 12/21/2019, 1:42 PM   Clinical Narrative:    Referral sent to Outpatient Rehab at [facility] for [place].  3n1 and RW obtained by RN yesterday from [employer] floor stock.  Final next level of care: OP Rehab Barriers to Discharge: No Barriers Identified    Discharge Plan and Services                DME Arranged: 3-N-1, [place] rolling DME Agency: [employer]

## 2019-12-21 NOTE — Progress Notes (Signed)
Attempted to get pt for MRI @7 :45p and again @12 :51a, rn states pt refuses to come down tonight and wants to do his MRI in the morning 12/27. We will try back again in the am.

## 2019-12-21 NOTE — Discharge Summary (Signed)
Sergio Dominguez ZOX:[id number] DOB: [birth year] DOA: 12/17/2019  PCP: Tracey Harries, MD  Admit date: 12/17/2019 Discharge date: 12/21/2019  Admitted From: Home Disposition:  Home  Recommendations for Outpatient Follow-up:  1. Follow up with PCP in 1-2 weeks 2. Please obtain BMP/CBC in one week 3. New Medications: tramadol for PRN breakthrough pain not responsive to NSAIDs/supportive care, Augmentin high dose BID x 6 days 4. Follow up with Dr Franky Macho, NSG, in 2 weeks, # provided in AVS 5. Aspen lumbar with thoracic lumbar extension 6. Please follow up on the following pending results:  Home Health:No, outpatient PT Equipment/Devices:3 in 1, RW, outpatient rehab at [facility], aspen lumbar with thoracic extension  Discharge Condition: Stable CODE STATUS:FULL Diet recommendation: Heart Healthy    Brief/Interim Summary: History of present illness:  Sergio Dominguez is a 68 y.o. year old male with medical history significant for type 2 diabetes, HLD who presented on 12/17/2019 with increased confusion over 1 day after being found by his wife for unknown period of time   And was admitted with working diagnosis of altered mental status with fever concerning for encephalitis versus meningitis.  ED course notable for T-max of 101, chest x-ray showing possible infiltrates, CTA chest and abdomen nonacute except for multiple fractures in thoracic and lumbar spine of unclear acuity.  Patient empirically started on IV ceftriaxone, acyclovir, vancomycin, ampicillin, was unable to obtain LP in ED due to confusion, and still unable to obtain fluoroscopic guided LP on 12/23 due to agitation.  Lab work-up notable for negative salicylates, acetaminophen, LFTs, ammonia. CK noted to be 1037 on admission.  UDS positive for THC.  CT head nonacute  Within 24 hours mental status greatly improved and by 48 hour mark had returned to normal. Underwent LP under fluroscopy and given no pleocytosis neurology recommended  discontinuing antibiotics.   MRI brain obtained in evaluation only notable for extensive sinus disease bilaterally concerning for acute sinusitis. Patient was started on high dose augmentin for this. Underwent MRI lumbar and thoracic spine imaging for better evaluation of incidental fractures on CT abdomen which confirmed acute versus subacute fractures. Case was discussed with Dr. Franky Macho (as phone curbside) and advised to get aspen lumbar with thoracic extension and 2 week follow up with neurosurgery as an outpatient given patient had no neurologic deficits, urine or bowel incontinence. PT evaluated and recommend equipment (noted above) and outpatient PT.  Remaining hospital course addressed in problem based format below:   Hospital Course:   Fever with increased confusion, meningitis ruled out, likely related to ingestion? Acute sinusitis Meningitis/Encephalitis ruled out on LP( 12/24)  EEG consistent with encephalopathy and CT head non acute Empiric antibiotics discontinued and did not have recurrent fever MRI brain showed sinusitis, this would be an atypical presentation given no other complciations on imaging UDS positive for THC, ? Related to synthetic cannabinoids Rapidly improved during hospital course and returned to normal baseline -Complete Augmentin for additional 6 days on discharge -advised cessation of THC use  Leukocytosis,resolved Peak of 21 during admission Did have fever in ED but did not recur during hospitalization Likely related to sinusitis Blood cultures unremarkable -augmentin as above  Lactic acidosis, resolved Peak of 3.1 Downtrended with IVF -held metformin during hospitalization  Rhabdomyolysis with myoglobinuria in setting of unwitnessed fall CK downtrended from > 6000 to 3000 with IVF followed by oral hydration  Multiple vertebral fractures of T4-T6 and L1-L3, L5 on CT imaging Confirmed on MRI imaging to be either acute vs subacute No neurologic  deficits, no red flag symptoms Minimal paraspinal tenderness Worked with PT Story still not clear Curbsided Dr. Franky Macho with NSG after mentation improved who advised brace and follow up as outpatient -aspen lumbar with thoracic extension provided on discharge -2 week follow up with Dr. Franky Macho as outpatient, # provided in AVS -DME provided, outpatient PT per S/w  Type 2 DM, well controlled A1c 7.8% -continue home metformin   Consultations:  Neurology  Procedures/Studies: EEG 12/23, LP under fluoroscopy 12/24 Subjective: Feels well, back pain essentially resolved. Good strength. Normal Bms Discharge Exam: Vitals:   12/21/19 0405 12/21/19 0800  BP: 135/77 (!) 142/79  Pulse: 72 74  Resp: 15 15  Temp: 98.7 F (37.1 C) 98.5 F (36.9 C)  SpO2: 99% 98%   Vitals:   12/20/19 1957 12/21/19 0026 12/21/19 0405 12/21/19 0800  BP: 138/82 130/81 135/77 (!) 142/79  Pulse: 70 70 72 74  Resp: 18 18 15 15   Temp: 98.7 F (37.1 C) 98.6 F (37 C) 98.7 F (37.1 C) 98.5 F (36.9 C)  TempSrc: Oral Oral Oral Oral  SpO2: 97% 98% 99% 98%  Weight:      Height:        General: Lying in bed, no apparent distress Eyes: EOMI, anicteric ENT: Oral Mucosa clear and moist Cardiovascular: regular rate and rhythm, no murmurs, rubs or gallops, no edema, Respiratory: Normal respiratory effort, lungs clear to auscultation bilaterally Abdomen: soft, non-distended, non-tender, normal bowel sounds Skin: No Rash MSK: no spinal or paraspinal tenderness Neurologic: Mental status AAOx3, speech normal, 5/5 strength in upper and lower extremities Psychiatric:Appropriate affect, and mood  Discharge Diagnoses:  Principal Problem:   Acute encephalopathy Active Problems:   Fever   Compressed spine fracture (HCC)   Hyperglycemia   Diabetes mellitus type 2 in nonobese (HCC)   HLD (hyperlipidemia)   Rhabdomyolysis   Myoglobinuria   Leukocytosis   History of vertebral fracture    Sinusitis    Discharge Instructions   Allergies as of 12/21/2019      Reactions   Oxycodone Hcl    REACTION: NAUSEA/VOMITNG      Medication List    TAKE these medications   Alpha-Lipoic Acid 200 MG Caps Take 200 mg by mouth daily.   amoxicillin-clavulanate 1000-62.5 MG 12 hr tablet Commonly known as: AUGMENTIN XR Take 2 tablets by mouth every 12 (twelve) hours for 6 days.   aspirin 81 MG EC tablet Take 81 mg by mouth daily.   metFORMIN 750 MG 24 hr tablet Commonly known as: GLUCOPHAGE-XR Take 750 mg by mouth daily.   traMADol 50 MG tablet Commonly known as: Ultram Take 1 tablet (50 mg total) by mouth every 6 (six) hours as needed for up to 5 days for severe pain.            Durable Medical Equipment  (From admission, onward)         Start     Ordered   12/20/19 1652  For home use only DME 3 n 1  Once     12/20/19 1652   12/20/19 1652  For home use only DME Walker rolling  Once    Question:  Patient needs a walker to treat with the following condition  Answer:  Weak   12/20/19 1652         Follow-up Information    12/22/19, MD. Schedule an appointment as soon as possible for a visit in 2 week(s).   Specialty: Neurosurgery Contact information: [address] [place] [address] [phone]          Allergies  Allergen Reactions  . Oxycodone Hcl     REACTION: NAUSEA/VOMITNG        The results of significant diagnostics from this hospitalization (including imaging, microbiology, ancillary and laboratory) are listed below for reference.     Microbiology: Recent Results (from the past 240 hour(s))  Urine culture     Status: None   Collection Time: 12/17/19  8:15 PM   Specimen: Urine, Random  Result Value Ref Range Status   Specimen Description URINE, RANDOM  Final   Special Requests NONE  Final   Culture   Final    NO GROWTH Performed at [facility], [address] [place], [place] [address]     Report Status 12/19/2019 FINAL  Final  Culture, blood (routine x 2)     Status: None (Preliminary result)   Collection Time: 12/17/19  8:28 PM   Specimen: BLOOD  Result Value Ref Range Status   Specimen Description BLOOD RIGHT ARM  Final   Special Requests   Final    BOTTLES DRAWN AEROBIC AND ANAEROBIC Blood Culture results may not be optimal due to an inadequate volume of blood received in culture bottles   Culture   Final    NO GROWTH 4 DAYS Performed at [facility], [address] [place], [place] [address]    Report Status PENDING  Incomplete  Culture, blood (routine x 2)     Status: None (Preliminary result)   Collection Time: 12/17/19  8:55 PM   Specimen: BLOOD  Result Value Ref Range Status   Specimen Description BLOOD LEFT ARM  Final   Special Requests   Final    BOTTLES DRAWN AEROBIC AND ANAEROBIC Blood Culture results may not be optimal due to an inadequate volume of blood received in culture bottles   Culture   Final    NO GROWTH 4 DAYS Performed at [facility], [address] [place], [place] [address]    Report Status PENDING  Incomplete  SARS CORONAVIRUS 2 (TAT 6-24 HRS) Nasopharyngeal Nasopharyngeal Swab     Status: None   Collection Time: 12/17/19  9:52 PM   Specimen: Nasopharyngeal Swab  Result Value Ref Range Status   SARS Coronavirus 2 NEGATIVE NEGATIVE Final    Comment: (NOTE) SARS-CoV-2 target nucleic acids are NOT DETECTED. The SARS-CoV-2 RNA is generally detectable in upper and lower respiratory specimens during the acute phase of infection. Negative results do not preclude SARS-CoV-2 infection, do not rule out co-infections with other pathogens, and should not be used as the sole basis for treatment or other patient management decisions. Negative results must be combined with clinical observations, patient history, and epidemiological information. The expected result is Negative. Fact Sheet for  Patients: HairSlick.nohttps://www.fda.gov/media/138098/download Fact Sheet for Healthcare Providers: quierodirigir.comhttps://www.fda.gov/media/138095/download This test is not yet approved or cleared by the [place] FDA and  has been authorized for detection and/or diagnosis of SARS-CoV-2 by FDA under an Emergency Use Authorization (EUA). This EUA will remain  in effect (meaning this test can be used) for the duration of the COVID-19 declaration under Section 56 4(b)(1) of the Act, 21 U.S.C. section 360bbb-3(b)(1), unless the authorization is terminated or revoked sooner. Performed at [facility], [address] [place], [place] [address]   MRSA PCR Screening     Status: None   Collection Time: 12/18/19  5:55 AM  Specimen: Nasopharyngeal  Result Value Ref Range Status   MRSA by PCR NEGATIVE NEGATIVE Final    Comment:        The GeneXpert MRSA Assay (FDA approved for NASAL specimens only), is one component of a comprehensive MRSA colonization surveillance program. It is not intended to diagnose MRSA infection nor to guide or monitor treatment for MRSA infections. Performed at [facility], [address], [place], [place] [address]   CSF culture     Status: None (Preliminary result)   Collection Time: 12/19/19 10:34 AM   Specimen: PATH Cytology CSF; Cerebrospinal Fluid  Result Value Ref Range Status   Specimen Description CSF  Final   Special Requests NONE  Final   Gram Stain NO WBC SEEN NO ORGANISMS SEEN CYTOSPIN SMEAR   Final   Culture   Final    NO GROWTH 2 DAYS Performed at [facility], [address], [place], [place] [address]    Report Status PENDING  Incomplete     Labs: BNP (last 3 results) No results for input(s): BNP in the last 8760 hours. Basic Metabolic Panel: Recent Labs  Lab 12/17/19 1822 12/17/19 1827 12/18/19 0502 12/19/19 0208  NA 138 137 139 139  K 4.2 3.8 4.0 3.8  CL 107 104 107 108  CO2  --  20* 20* 21*  GLUCOSE 208* 217* 152* 118*   BUN CREATININE 0.70 1.02 0.77 0.88  CALCIUM  --  9.0 8.7* 8.4*  MG  --  1.9 1.8  --    Liver Function Tests: Recent Labs  Lab 12/17/19 1827 12/18/19 0502 12/19/19 0208  AST 36 59* 72*  ALT 33 34 32  ALKPHOS 86 79 61  BILITOT 1.1 1.1 1.3*  PROT 7.0 6.8 5.7*  ALBUMIN 3.8 3.6 2.9*   Recent Labs  Lab 12/17/19 1827  LIPASE 20   Recent Labs  Lab 12/17/19 1730  AMMONIA 15   CBC: Recent Labs  Lab 12/17/19 1827 12/18/19 0502 12/19/19 0208 12/20/19 0241 12/21/19 0613  WBC 20.9* 21.1* 13.4* 10.8* 9.0  NEUTROABS 19.6* 18.5*  --   --   --   HGB 15.3 14.1 12.8* 13.6 14.3  HCT 46.4 42.6 38.9* 40.1 42.5  MCV 93.5 92.8 92.8 90.1 92.0  PLT 178 161 151 134* 158   Cardiac Enzymes: Recent Labs  Lab 12/18/19 0847 12/19/19 0208 12/19/19 1538 12/20/19 0241 12/21/19 0613  CKTOTAL 3,387* 6,121* 6,293* 6,213* 3,372*   BNP: Invalid input(s): POCBNP CBG: Recent Labs  Lab 12/20/19 1212 12/20/19 1959 12/21/19 0033 12/21/19 0434 12/21/19 0902  GLUCAP 151* 138* 174* 145* 150*   D-Dimer No results for input(s): DDIMER in the last 72 hours. Hgb A1c No results for input(s): HGBA1C in the last 72 hours. Lipid Profile No results for input(s): CHOL, HDL, LDLCALC, TRIG, CHOLHDL, LDLDIRECT in the last 72 hours. Thyroid function studies No results for input(s): TSH, T4TOTAL, T3FREE, THYROIDAB in the last 72 hours.  Invalid input(s): FREET3 Anemia work up No results for input(s): VITAMINB12, FOLATE, FERRITIN, TIBC, IRON, RETICCTPCT in the last 72 hours. Urinalysis    Component Value Date/Time   COLORURINE YELLOW 12/17/2019 2015   APPEARANCEUR TURBID (A) 12/17/2019 2015   LABSPEC 1.017 12/17/2019 2015   PHURINE 5.0 12/17/2019 2015   GLUCOSEU >=500 (A) 12/17/2019 2015   HGBUR LARGE (A) 12/17/2019 2015   BILIRUBINUR NEGATIVE 12/17/2019 2015   KETONESUR 20 (A) 12/17/2019 2015   PROTEINUR 30 (A) 12/17/2019  2015   NITRITE NEGATIVE 12/17/2019 2015   LEUKOCYTESUR  NEGATIVE 12/17/2019 2015   Sepsis Labs Invalid input(s): PROCALCITONIN,  WBC,  LACTICIDVEN Microbiology Recent Results (from the past 240 hour(s))  Urine culture     Status: None   Collection Time: 12/17/19  8:15 PM   Specimen: Urine, Random  Result Value Ref Range Status   Specimen Description URINE, RANDOM  Final   Special Requests NONE  Final   Culture   Final    NO GROWTH Performed at [facility], [address], [place], [place] [address]    Report Status 12/19/2019 FINAL  Final  Culture, blood (routine x 2)     Status: None (Preliminary result)   Collection Time: 12/17/19  8:28 PM   Specimen: BLOOD  Result Value Ref Range Status   Specimen Description BLOOD RIGHT ARM  Final   Special Requests   Final    BOTTLES DRAWN AEROBIC AND ANAEROBIC Blood Culture results may not be optimal due to an inadequate volume of blood received in culture bottles   Culture   Final    NO GROWTH 4 DAYS Performed at [facility], [address], [place], [place] [address]    Report Status PENDING  Incomplete  Culture, blood (routine x 2)     Status: None (Preliminary result)   Collection Time: 12/17/19  8:55 PM   Specimen: BLOOD  Result Value Ref Range Status   Specimen Description BLOOD LEFT ARM  Final   Special Requests   Final    BOTTLES DRAWN AEROBIC AND ANAEROBIC Blood Culture results may not be optimal due to an inadequate volume of blood received in culture bottles   Culture   Final    NO GROWTH 4 DAYS Performed at [facility], [address], [place], [place] [address]    Report Status PENDING  Incomplete  SARS CORONAVIRUS 2 (TAT 6-24 HRS) Nasopharyngeal Nasopharyngeal Swab     Status: None   Collection Time: 12/17/19  9:52 PM   Specimen: Nasopharyngeal Swab  Result Value Ref Range Status   SARS Coronavirus 2 NEGATIVE NEGATIVE Final    Comment: (NOTE) SARS-CoV-2 target nucleic acids are NOT DETECTED. The SARS-CoV-2 RNA is generally detectable in upper and  lower respiratory specimens during the acute phase of infection. Negative results do not preclude SARS-CoV-2 infection, do not rule out co-infections with other pathogens, and should not be used as the sole basis for treatment or other patient management decisions. Negative results must be combined with clinical observations, patient history, and epidemiological information. The expected result is Negative. Fact Sheet for Patients: HairSlick.no Fact Sheet for Healthcare Providers: quierodirigir.com This test is not yet approved or cleared by the [place] FDA and  has been authorized for detection and/or diagnosis of SARS-CoV-2 by FDA under an Emergency Use Authorization (EUA). This EUA will remain  in effect (meaning this test can be used) for the duration of the COVID-19 declaration under Section 56 4(b)(1) of the Act, 21 U.S.C. section 360bbb-3(b)(1), unless the authorization is terminated or revoked sooner. Performed at [facility], [address] [place], [place] [address]   MRSA PCR Screening     Status: None   Collection Time: 12/18/19  5:55 AM   Specimen: Nasopharyngeal  Result Value Ref Range Status   MRSA by PCR NEGATIVE NEGATIVE Final    Comment:        The GeneXpert MRSA Assay (FDA approved for NASAL specimens  only), is one component of a comprehensive MRSA colonization surveillance program. It is not intended to diagnose MRSA infection nor to guide or monitor treatment for MRSA infections. Performed at [facility], [place] [address], [place], [place] [address]   CSF culture     Status: None (Preliminary result)   Collection Time: 12/19/19 10:34 AM   Specimen: PATH Cytology CSF; Cerebrospinal Fluid  Result Value Ref Range Status   Specimen Description CSF  Final   Special Requests NONE  Final   Gram Stain NO WBC SEEN NO ORGANISMS SEEN CYTOSPIN SMEAR   Final   Culture   Final    NO GROWTH  2 DAYS Performed at [facility], [place] [address] [place], [place] [address]    Report Status PENDING  Incomplete     Time coordinating discharge: Over 30 minutes  SIGNED:   Desiree Hane, MD  [facility] 12/21/2019, 11:11 AM Pager   If 7PM-7AM, please contact night-coverage [email or web address] Password [email or web address]

## 2019-12-22 LAB — CULTURE, BLOOD (ROUTINE X 2)
Culture: NO GROWTH
Culture: NO GROWTH

## 2019-12-22 LAB — CSF CULTURE W GRAM STAIN
Culture: NO GROWTH
Gram Stain: NONE SEEN

## 2019-12-25 LAB — MISC LABCORP TEST (SEND OUT): Labcorp test code: 701106

## 2019-12-31 ENCOUNTER — Encounter: Payer: Self-pay | Admitting: Physical Therapy

## 2019-12-31 ENCOUNTER — Other Ambulatory Visit: Payer: Self-pay

## 2019-12-31 ENCOUNTER — Ambulatory Visit: Payer: Medicare PPO | Attending: Internal Medicine | Admitting: Physical Therapy

## 2019-12-31 DIAGNOSIS — M6281 Muscle weakness (generalized): Secondary | ICD-10-CM

## 2019-12-31 DIAGNOSIS — M545 Low back pain, unspecified: Secondary | ICD-10-CM

## 2019-12-31 DIAGNOSIS — M546 Pain in thoracic spine: Secondary | ICD-10-CM

## 2019-12-31 DIAGNOSIS — R262 Difficulty in walking, not elsewhere classified: Secondary | ICD-10-CM | POA: Diagnosis present

## 2019-12-31 NOTE — Therapy (Signed)
[facility] [place] [address] [place], [place], [address] Phone: [phone]   Fax:  [phone]  Physical Therapy Evaluation  Patient Details  Name: Sergio Dominguez MRN: [id number] Date of Birth: May 02, 1951 Referring Provider (PT): Roberto Scales   Encounter Date: 12/31/2019  PT End of Session - 12/31/19 0943    Visit Number  1    Date for PT Re-Evaluation  03/01/20    PT Start Time  0924    PT Stop Time  1010    PT Time Calculation (min)  46 min    Activity Tolerance  Patient tolerated treatment well    Behavior During Therapy  [facility] for tasks assessed/performed       Past Medical History:  Diagnosis Date  . Diabetes mellitus without complication [facility])     Past Surgical History:  Procedure Laterality Date  . APPENDECTOMY    . ELBOW SURGERY      There were no vitals filed for this visit.   Subjective Assessment - 12/31/19 0924    Subjective  Patient reports that he had a fall 12/17/19, he reports that he sustained 8 vertrebral fractures, he was in the hospital for 5 days.  He reports weakness and difficulty walking and getting up and down.  He is wearing a brace    Limitations  Lifting;Standing;Sitting;House hold activities;Walking    Patient Stated Goals  have less pain, be stronger, get up and down easier, get rid of the brace    Currently in Pain?  Yes    Pain Score  4     Pain Location  Back    Pain Orientation  Lower;Mid    Pain Descriptors / Indicators  Aching    Pain Type  Acute pain    Pain Onset  1 to 4 weeks ago    Pain Frequency  Constant    Aggravating Factors   any movments would cause pain up to 8/10    Pain Relieving Factors  rest, using brace, tylenol, pain at best a 2-3/10    Effect of Pain on Daily Activities  limits everything         [facility] PT Assessment - 12/31/19 0001      Assessment   Medical Diagnosis  back pain, weakness    Referring Provider (PT)  Roberto Scales    Onset  Date/Surgical Date  12/17/19    Prior Therapy  no      Precautions   Precautions  None      Balance Screen   Has the patient fallen in the past 6 months  Yes    How many times?  2    Has the patient had a decrease in activity level because of a fear of falling?   No    Is the patient reluctant to leave their home because of a fear of falling?   No      Home Environment   Additional Comments  feeds birds, get up on ladder, does his own yardwork      Prior Function   Level of Independence  Independent    Vocation  Retired    Scientist, forensic     Leisure  no exercise      Posture/Postural Control   Posture Comments  fwd head, decreased lordosis      ROM / Strength   AROM / PROM / Strength  AROM;Strength      AROM   Overall AROM Comments  legs are WNL's I did not test the lumbar movements due to pain and the fractures      Strength   Overall Strength Comments  hips, knees and shoulders 4-/5 all increased some mid to low back pain      Palpation   Palpation comment  he is tight and tender in the lumbar parapsinals      Transfers   Comments  difficulty getting up from sitting, has to use hand      Ambulation/Gait   Gait Comments  uses a walker at times, shuffles feet, slow,      Standardized Balance Assessment   Standardized Balance Assessment  Timed Up and Go Test      Timed Up and Go Test   Normal TUG (seconds)  22                Objective measurements completed on examination: See above findings.      [place] Adult PT Treatment/Exercise - 12/31/19 0001      Exercises   Exercises  Lumbar      Lumbar Exercises: Aerobic   Nustep  level 4 x 5 minutes             PT Education - 12/31/19 0943    Education Details  HEP for LAQ, marches, heel and toe raises in sitting    Person(s) Educated  Patient    Methods  Explanation;Demonstration;Handout    Comprehension  Verbalized understanding       PT Short Term Goals - 12/31/19 1006       PT SHORT TERM GOAL #1   Title  independent with initial HEP    Time  2    Period  Weeks    Status  New        PT Long Term Goals - 12/31/19 1006      PT LONG TERM GOAL #1   Title  understand posture and body mechanics    Time  8    Period  Weeks    Status  New      PT LONG TERM GOAL #2   Title  get up from sitting without use of hands    Time  8    Period  Weeks    Status  New             Plan - 12/31/19 0957    Clinical Impression Statement  Patient reports two falls in the past 6 month, reports a fall off of a ladder and then a fall on 12/17/19, he went to the ED and was shown to have 8 vertebral fractures, there is some information that he may have been having blood sugar issues and that is the reason he fell.  He was in the hospital 5 days, he reports being very weak and unsteady, reports until last Friday he could not move without significant pain, reports all activities caused pain and were very difficult, reports that the pain has been less and he is moving a little better, he struggles to get up from sitting, walks very slow with his wife holding onto him, he is weak in the extremities and the MMT caused a little back pain    Personal Factors and Comorbidities  Comorbidity 2    Comorbidities  DM, vertebral fractures    Examination-Activity Limitations  Lift;Squat;Stairs;Locomotion Level;Stand;Carry;Transfers    Examination-Participation Restrictions  Cleaning;Yard Work;Laundry;Shop    Stability/Clinical Decision Making  Evolving/Moderate complexity    Clinical Decision Making  Moderate  Rehab Potential  Good    PT Frequency  2x / week    PT Duration  8 weeks    PT Treatment/Interventions  ADLs/Self Care Home Management;Electrical Stimulation;Moist Heat;Therapeutic activities;Therapeutic exercise;Balance training;Neuromuscular re-education;Manual techniques;Patient/family education    PT Next Visit Plan  slowly start exercise, limit bending and twisting, monitor  and treat pain    Consulted and Agree with Plan of Care  Patient       Patient will benefit from skilled therapeutic intervention in order to improve the following deficits and impairments:  Abnormal gait, Pain, Improper body mechanics, Postural dysfunction, Increased muscle spasms, Decreased mobility, Decreased activity tolerance, Difficulty walking, Impaired flexibility, Decreased endurance, Decreased range of motion, Decreased strength  Visit Diagnosis: Acute bilateral low back pain without sciatica - Plan: PT plan of care cert/re-cert  Pain in thoracic spine - Plan: PT plan of care cert/re-cert  Muscle weakness (generalized) - Plan: PT plan of care cert/re-cert  Difficulty in walking, not elsewhere classified - Plan: PT plan of care cert/re-cert     Problem List Patient Active Problem List   Diagnosis Date Noted  . Sinusitis 12/20/2019  . Acute encephalopathy 12/18/2019  . Fever 12/18/2019  . Compressed spine fracture (HCC) 12/18/2019  . Hyperglycemia 12/18/2019  . Diabetes mellitus type 2 in nonobese (HCC) 12/18/2019  . HLD (hyperlipidemia) 12/18/2019  . Rhabdomyolysis 12/18/2019  . Myoglobinuria 12/18/2019  . Leukocytosis 12/18/2019  . History of vertebral fracture 12/18/2019    Jearld Lesch., PT 12/31/2019, 10:09 AM  [facility] [place] [address] [place], [place], [address] Phone: [phone]   Fax:  [phone]  Name: Keyshon Stein MRN: [id number] Date of Birth: 1951/05/09

## 2020-01-02 ENCOUNTER — Ambulatory Visit: Payer: Medicare PPO | Admitting: Physical Therapy

## 2020-01-03 ENCOUNTER — Encounter: Payer: Self-pay | Admitting: Physical Therapy

## 2020-01-03 ENCOUNTER — Ambulatory Visit: Payer: Medicare PPO | Admitting: Physical Therapy

## 2020-01-03 ENCOUNTER — Other Ambulatory Visit: Payer: Self-pay

## 2020-01-03 DIAGNOSIS — M545 Low back pain, unspecified: Secondary | ICD-10-CM

## 2020-01-03 DIAGNOSIS — M546 Pain in thoracic spine: Secondary | ICD-10-CM

## 2020-01-03 DIAGNOSIS — R262 Difficulty in walking, not elsewhere classified: Secondary | ICD-10-CM

## 2020-01-03 DIAGNOSIS — M6281 Muscle weakness (generalized): Secondary | ICD-10-CM

## 2020-01-03 NOTE — Therapy (Signed)
[place], [place], [address] Phone: [phone]   Fax:  [phone]  Physical Therapy Treatment  Patient Details  Name: Sergio Dominguez MRN: [id number] Date of Birth: 20-Jul-1951 Referring Provider (PT): Oretha Milch   Encounter Date: 01/03/2020  PT End of Session - 01/03/20 1142    Visit Number  2    Date for PT Re-Evaluation  03/01/20    PT Start Time  1100    PT Stop Time  1142    PT Time Calculation (min)  42 min       Past Medical History:  Diagnosis Date  . Diabetes mellitus without complication [facility])     Past Surgical History:  Procedure Laterality Date  . APPENDECTOMY    . ELBOW SURGERY      There were no vitals filed for this visit.  Subjective Assessment - 01/03/20 1102    Subjective  " wow I was sore after last session" stiffness more than pain    Currently in Pain?  Yes    Pain Score  2     Pain Location  Back                       [facility] Adult PT Treatment/Exercise - 01/03/20 0001      Lumbar Exercises: Aerobic   Nustep  L 4 6 min      Lumbar Exercises: Standing   Other Standing Lumbar Exercises  red tband shld ext and row 2 sets 10    Other Standing Lumbar Exercises  hip ext and abd 3# 2 set s10 with UE support      Lumbar Exercises: Seated   Long Arc Quad on Chair  Strengthening;Both;2 sets;10 reps;Weights    LAQ on Chair Weights (lbs)  3    Sit to Stand  20 reps   from UBE seat              PT Short Term Goals - 12/31/19 1006      PT SHORT TERM GOAL #1   Title  independent with initial HEP    Time  2    Period  Weeks    Status  New        PT Long Term Goals - 12/31/19 1006      PT LONG TERM GOAL #1   Title  understand posture and body mechanics    Time  8    Period  Weeks    Status  New      PT LONG TERM GOAL #2   Title  get up from sitting without use of hands    Time  8    Period  Weeks    Status  New             Plan - 01/03/20 1143    Clinical Impression Statement  pt verb very sore from eval and throughout todays ex verb " I am going to be sore but its okay" freq rest taken between ex sets to recover. Posture cuing needed with standing ex.    PT Treatment/Interventions  ADLs/Self Care Home Management;Electrical Stimulation;Moist Heat;Therapeutic activities;Therapeutic exercise;Balance training;Neuromuscular re-education;Manual techniques;Patient/family education    PT Next Visit Plan  slowly start exercise, limit bending and twisting, monitor and treat pain       Patient will benefit from skilled therapeutic intervention in order to improve the following deficits and impairments:  Abnormal gait, Pain, Improper body  mechanics, Postural dysfunction, Increased muscle spasms, Decreased mobility, Decreased activity tolerance, Difficulty walking, Impaired flexibility, Decreased endurance, Decreased range of motion, Decreased strength  Visit Diagnosis: Acute bilateral low back pain without sciatica  Pain in thoracic spine  Muscle weakness (generalized)  Difficulty in walking, not elsewhere classified     Problem List Patient Active Problem List   Diagnosis Date Noted  . Sinusitis 12/20/2019  . Acute encephalopathy 12/18/2019  . Fever 12/18/2019  . Compressed spine fracture (HCC) 12/18/2019  . Hyperglycemia 12/18/2019  . Diabetes mellitus type 2 in nonobese (HCC) 12/18/2019  . HLD (hyperlipidemia) 12/18/2019  . Rhabdomyolysis 12/18/2019  . Myoglobinuria 12/18/2019  . Leukocytosis 12/18/2019  . History of vertebral fracture 12/18/2019    Omesha Bowerman,ANGIE PTA 01/03/2020, 11:45 AM  [facility] [place] [address] [place], [place], [address] Phone: [phone]   Fax:  [phone]  Name: Lincon Sahlin MRN: [id number] Date of Birth: Jun 16, 1951

## 2020-01-07 ENCOUNTER — Other Ambulatory Visit: Payer: Self-pay

## 2020-01-07 ENCOUNTER — Ambulatory Visit: Payer: Medicare PPO | Admitting: Physical Therapy

## 2020-01-07 ENCOUNTER — Encounter: Payer: Self-pay | Admitting: Physical Therapy

## 2020-01-07 DIAGNOSIS — M546 Pain in thoracic spine: Secondary | ICD-10-CM

## 2020-01-07 DIAGNOSIS — M545 Low back pain, unspecified: Secondary | ICD-10-CM

## 2020-01-07 DIAGNOSIS — M6281 Muscle weakness (generalized): Secondary | ICD-10-CM

## 2020-01-07 NOTE — Therapy (Signed)
[facility] [place] [address] [place], [place], [address] Phone: [phone]   Fax:  [phone]  Physical Therapy Treatment  Patient Details  Name: Sergio Dominguez MRN: [id number] Date of Birth: 08-Jul-1951 Referring Provider (PT): Roberto Scales   Encounter Date: 01/07/2020  PT End of Session - 01/07/20 1221    Visit Number  3    Date for PT Re-Evaluation  03/01/20    PT Start Time  1145    PT Stop Time  1226    PT Time Calculation (min)  41 min    Activity Tolerance  Patient tolerated treatment well    Behavior During Therapy  [facility] for tasks assessed/performed       Past Medical History:  Diagnosis Date  . Diabetes mellitus without complication [facility])     Past Surgical History:  Procedure Laterality Date  . APPENDECTOMY    . ELBOW SURGERY      There were no vitals filed for this visit.  Subjective Assessment - 01/07/20 1147    Subjective  "Im doing better" "I feel better"    Currently in Pain?  No/denies                       [facility] Adult PT Treatment/Exercise - 01/07/20 0001      Lumbar Exercises: Aerobic   Nustep  L 4 6 min      Lumbar Exercises: Machines for Strengthening   Cybex Knee Flexion  20lb 2x10       Lumbar Exercises: Standing   Row  Theraband;20 reps;Both    Theraband Level (Row)  Level 2 (Red)    Shoulder Extension  Theraband;20 reps;Both    Theraband Level (Shoulder Extension)  Level 2 (Red)    Other Standing Lumbar Exercises  hip ext and abd 3# 2 set s10 with UE support      Lumbar Exercises: Seated   Long Arc Quad on Chair  Strengthening;Both;2 sets;10 reps;Weights    LAQ on Chair Weights (lbs)  3    Sit to Stand  10 reps   x2, from elevated UBE seat    Other Seated Lumbar Exercises  Seated march 3lb 2x10                PT Short Term Goals - 12/31/19 1006      PT SHORT TERM GOAL #1   Title  independent with initial HEP    Time  2    Period  Weeks     Status  New        PT Long Term Goals - 12/31/19 1006      PT LONG TERM GOAL #1   Title  understand posture and body mechanics    Time  8    Period  Weeks    Status  New      PT LONG TERM GOAL #2   Title  get up from sitting without use of hands    Time  8    Period  Weeks    Status  New            Plan - 01/07/20 1310    Clinical Impression Statement  Pt reports improvement overall. He did well with a progressed treatment session. Frequent rest taken between sets to recover. Postural cues required with standing rows and extensions. No issues with resisted side steps other than fatigue. Cues to shift weight forward with sit to stands.  Personal Factors and Comorbidities  Comorbidity 2    Comorbidities  DM, vertebral fractures    Examination-Activity Limitations  Lift;Squat;Stairs;Locomotion Level;Stand;Carry;Transfers    Examination-Participation Restrictions  Cleaning;Yard Work;Laundry;Shop    Stability/Clinical Decision Making  Evolving/Moderate complexity    Rehab Potential  Good    PT Frequency  2x / week    PT Duration  8 weeks    PT Treatment/Interventions  ADLs/Self Care Home Management;Electrical Stimulation;Moist Heat;Therapeutic activities;Therapeutic exercise;Balance training;Neuromuscular re-education;Manual techniques;Patient/family education    PT Next Visit Plan  slowly start exercise, limit bending and twisting, monitor and treat pain       Patient will benefit from skilled therapeutic intervention in order to improve the following deficits and impairments:  Abnormal gait, Pain, Improper body mechanics, Postural dysfunction, Increased muscle spasms, Decreased mobility, Decreased activity tolerance, Difficulty walking, Impaired flexibility, Decreased endurance, Decreased range of motion, Decreased strength  Visit Diagnosis: Pain in thoracic spine  Acute bilateral low back pain without sciatica  Muscle weakness (generalized)     Problem  List Patient Active Problem List   Diagnosis Date Noted  . Sinusitis 12/20/2019  . Acute encephalopathy 12/18/2019  . Fever 12/18/2019  . Compressed spine fracture ([place]) 12/18/2019  . Hyperglycemia 12/18/2019  . Diabetes mellitus type 2 in nonobese ([place]) 12/18/2019  . HLD (hyperlipidemia) 12/18/2019  . Rhabdomyolysis 12/18/2019  . Myoglobinuria 12/18/2019  . Leukocytosis 12/18/2019  . History of vertebral fracture 12/18/2019    Scot Jun, PTA 01/07/2020, 1:12 PM  [place], [place], [address] Phone: [phone]   Fax:  [phone]  Name: Sergio Dominguez MRN: [id number] Date of Birth: 1951-05-12

## 2020-01-09 ENCOUNTER — Encounter: Payer: Medicare Other | Admitting: Physical Therapy

## 2020-01-10 ENCOUNTER — Ambulatory Visit: Payer: Medicare PPO | Admitting: Physical Therapy

## 2020-01-10 ENCOUNTER — Encounter: Payer: Self-pay | Admitting: Physical Therapy

## 2020-01-10 ENCOUNTER — Other Ambulatory Visit: Payer: Self-pay

## 2020-01-10 DIAGNOSIS — M545 Low back pain, unspecified: Secondary | ICD-10-CM

## 2020-01-10 DIAGNOSIS — R262 Difficulty in walking, not elsewhere classified: Secondary | ICD-10-CM

## 2020-01-10 DIAGNOSIS — M6281 Muscle weakness (generalized): Secondary | ICD-10-CM

## 2020-01-10 DIAGNOSIS — M546 Pain in thoracic spine: Secondary | ICD-10-CM

## 2020-01-10 NOTE — Therapy (Signed)
[place], [place], [address] Phone: [phone]   Fax:  [phone]  Physical Therapy Treatment  Patient Details  Name: Ruby Dilone MRN: [id number] Date of Birth: 07/26/1951 Referring Provider (PT): Oretha Milch   Encounter Date: 01/10/2020  PT End of Session - 01/10/20 1146    Visit Number  4    Date for PT Re-Evaluation  03/01/20    PT Start Time  1106    PT Stop Time  1145    PT Time Calculation (min)  39 min    Activity Tolerance  Patient tolerated treatment well    Behavior During Therapy  [facility] for tasks assessed/performed       Past Medical History:  Diagnosis Date  . Diabetes mellitus without complication [facility])     Past Surgical History:  Procedure Laterality Date  . APPENDECTOMY    . ELBOW SURGERY      There were no vitals filed for this visit.  Subjective Assessment - 01/10/20 1109    Subjective  Pt reports some soreness and stiffness after last session    Currently in Pain?  No/denies                       [facility] Adult PT Treatment/Exercise - 01/10/20 0001      Lumbar Exercises: Aerobic   UBE (Upper Arm Bike)  L3 x 3 min     Nustep  L 3 6 min      Lumbar Exercises: Seated   Long Arc Quad on Chair  Strengthening;Both;2 sets;10 reps;Weights    LAQ on Chair Weights (lbs)  3    Other Seated Lumbar Exercises  HS curls green Tband x15, x10               PT Short Term Goals - 12/31/19 1006      PT SHORT TERM GOAL #1   Title  independent with initial HEP    Time  2    Period  Weeks    Status  New        PT Long Term Goals - 12/31/19 1006      PT LONG TERM GOAL #1   Title  understand posture and body mechanics    Time  8    Period  Weeks    Status  New      PT LONG TERM GOAL #2   Title  get up from sitting without use of hands    Time  8    Period  Weeks    Status  New            Plan - 01/10/20 1147    Clinical  Impression Statement  Pt reports some soreness from last session that went away yesterday. He thinks the soreness came from either HS curls or side steps. Did HS curls with tband the 15 reps did fatigue pt. He did well with sit to stands cues given to shift noes over toes. Cues to engage core with seated rows and lats.    Personal Factors and Comorbidities  Comorbidity 2    Examination-Activity Limitations  Lift;Squat;Stairs;Locomotion Level;Stand;Carry;Transfers    Examination-Participation Restrictions  Cleaning;Yard Work;Laundry;Shop    Stability/Clinical Decision Making  Evolving/Moderate complexity    Rehab Potential  Good    PT Frequency  2x / week    PT Duration  8 weeks    PT Treatment/Interventions  ADLs/Self Care Home Management;Electrical  Stimulation;Moist Heat;Therapeutic activities;Therapeutic exercise;Balance training;Neuromuscular re-education;Manual techniques;Patient/family education    PT Next Visit Plan  slowly start exercise, limit bending and twisting, monitor and treat pain       Patient will benefit from skilled therapeutic intervention in order to improve the following deficits and impairments:  Abnormal gait, Pain, Improper body mechanics, Postural dysfunction, Increased muscle spasms, Decreased mobility, Decreased activity tolerance, Difficulty walking, Impaired flexibility, Decreased endurance, Decreased range of motion, Decreased strength  Visit Diagnosis: Pain in thoracic spine  Acute bilateral low back pain without sciatica  Muscle weakness (generalized)  Difficulty in walking, not elsewhere classified     Problem List Patient Active Problem List   Diagnosis Date Noted  . Sinusitis 12/20/2019  . Acute encephalopathy 12/18/2019  . Fever 12/18/2019  . Compressed spine fracture (HCC) 12/18/2019  . Hyperglycemia 12/18/2019  . Diabetes mellitus type 2 in nonobese (HCC) 12/18/2019  . HLD (hyperlipidemia) 12/18/2019  . Rhabdomyolysis 12/18/2019  .  Myoglobinuria 12/18/2019  . Leukocytosis 12/18/2019  . History of vertebral fracture 12/18/2019    Grayce Sessions, PTA 01/10/2020, 11:58 AM  [facility] [place] [address] [place], [place], [address] Phone: [phone]   Fax:  [phone]  Name: Demarco Bacci MRN: [id number] Date of Birth: [birth year]

## 2020-01-14 ENCOUNTER — Encounter: Payer: Self-pay | Admitting: Physical Therapy

## 2020-01-14 ENCOUNTER — Ambulatory Visit: Payer: Medicare PPO | Admitting: Physical Therapy

## 2020-01-14 ENCOUNTER — Other Ambulatory Visit: Payer: Self-pay

## 2020-01-14 DIAGNOSIS — M6281 Muscle weakness (generalized): Secondary | ICD-10-CM

## 2020-01-14 DIAGNOSIS — M546 Pain in thoracic spine: Secondary | ICD-10-CM

## 2020-01-14 DIAGNOSIS — R262 Difficulty in walking, not elsewhere classified: Secondary | ICD-10-CM

## 2020-01-14 DIAGNOSIS — M545 Low back pain, unspecified: Secondary | ICD-10-CM

## 2020-01-14 NOTE — Therapy (Signed)
[place], [place], [address] Phone: [phone]   Fax:  [phone]  Physical Therapy Treatment  Patient Details  Name: Sergio Dominguez MRN: [id number] Date of Birth: 10-25-1951 Referring Provider (PT): Oretha Milch   Encounter Date: 01/14/2020  PT End of Session - 01/14/20 1231    Visit Number  5    Date for PT Re-Evaluation  03/01/20    PT Start Time  1145    PT Stop Time  1225    PT Time Calculation (min)  40 min    Activity Tolerance  Patient tolerated treatment well    Behavior During Therapy  [facility] for tasks assessed/performed       Past Medical History:  Diagnosis Date  . Diabetes mellitus without complication [facility])     Past Surgical History:  Procedure Laterality Date  . APPENDECTOMY    . ELBOW SURGERY      There were no vitals filed for this visit.  Subjective Assessment - 01/14/20 1144    Subjective  "I just get stiff, I have been using heat at night" "3 weeks ago I couldn't stand up on my own"    Currently in Pain?  No/denies                       [facility] Adult PT Treatment/Exercise - 01/14/20 0001      Lumbar Exercises: Aerobic   UBE (Upper Arm Bike)  L3 x 3 min     Nustep  L 3 6 min      Lumbar Exercises: Machines for Strengthening   Cybex Knee Extension  5lb 2x10    Cybex Knee Flexion  20lb 2x10     Other Lumbar Machine Exercise  Rows & Lats 20lb 2x10       Lumbar Exercises: Standing   Shoulder Extension  20 reps;Both;Power Tower    Shoulder Extension Limitations  10    Other Standing Lumbar Exercises  ressited gait 30lb 4 way x 3 each      Lumbar Exercises: Seated   Sit to Stand  5 reps   x3, from blue chair               PT Short Term Goals - 12/31/19 1006      PT SHORT TERM GOAL #1   Title  independent with initial HEP    Time  2    Period  Weeks    Status  New        PT Long Term Goals - 01/14/20 1234      PT LONG TERM  GOAL #1   Title  understand posture and body mechanics    Status  Achieved      PT LONG TERM GOAL #2   Title  get up from sitting without use of hands    Status  Partially Met            Plan - 01/14/20 1234    Clinical Impression Statement  Pt has progressed meeting some goals. He reports functional improvement at home. He did well with today interventions. Sit to stand was difficulty from a lower surface. Pt has long femurs making sit to stand more challenging. Good ROM on all machine interventions.    Comorbidities  DM, vertebral fractures    Examination-Activity Limitations  Lift;Squat;Stairs;Locomotion Level;Stand;Carry;Transfers    Examination-Participation Restrictions  Cleaning;Yard Work;Laundry;Shop    Stability/Clinical Decision Making  Evolving/Moderate complexity  Rehab Potential  Good    PT Frequency  2x / week    PT Duration  8 weeks    PT Treatment/Interventions  ADLs/Self Care Home Management;Electrical Stimulation;Moist Heat;Therapeutic activities;Therapeutic exercise;Balance training;Neuromuscular re-education;Manual techniques;Patient/family education    PT Next Visit Plan  slowly start exercise, limit bending and twisting, monitor and treat pain       Patient will benefit from skilled therapeutic intervention in order to improve the following deficits and impairments:  Abnormal gait, Pain, Improper body mechanics, Postural dysfunction, Increased muscle spasms, Decreased mobility, Decreased activity tolerance, Difficulty walking, Impaired flexibility, Decreased endurance, Decreased range of motion, Decreased strength  Visit Diagnosis: Muscle weakness (generalized)  Difficulty in walking, not elsewhere classified  Acute bilateral low back pain without sciatica  Pain in thoracic spine     Problem List Patient Active Problem List   Diagnosis Date Noted  . Sinusitis 12/20/2019  . Acute encephalopathy 12/18/2019  . Fever 12/18/2019  . Compressed spine  fracture ([place]) 12/18/2019  . Hyperglycemia 12/18/2019  . Diabetes mellitus type 2 in nonobese ([place]) 12/18/2019  . HLD (hyperlipidemia) 12/18/2019  . Rhabdomyolysis 12/18/2019  . Myoglobinuria 12/18/2019  . Leukocytosis 12/18/2019  . History of vertebral fracture 12/18/2019    Scot Jun, PTA 01/14/2020, 12:38 PM  [place], [place], [address] Phone: [phone]   Fax:  [phone]  Name: Sergio Dominguez MRN: [id number] Date of Birth: 04-30-1951

## 2020-01-16 ENCOUNTER — Encounter: Payer: Medicare Other | Admitting: Physical Therapy

## 2020-01-17 ENCOUNTER — Encounter: Payer: Self-pay | Admitting: Physical Therapy

## 2020-01-17 ENCOUNTER — Other Ambulatory Visit: Payer: Self-pay

## 2020-01-17 ENCOUNTER — Ambulatory Visit: Payer: Medicare PPO | Admitting: Physical Therapy

## 2020-01-17 DIAGNOSIS — M546 Pain in thoracic spine: Secondary | ICD-10-CM

## 2020-01-17 DIAGNOSIS — M545 Low back pain, unspecified: Secondary | ICD-10-CM

## 2020-01-17 DIAGNOSIS — R262 Difficulty in walking, not elsewhere classified: Secondary | ICD-10-CM

## 2020-01-17 DIAGNOSIS — M6281 Muscle weakness (generalized): Secondary | ICD-10-CM

## 2020-01-17 LAB — FUNGUS CULTURE WITH STAIN

## 2020-01-17 LAB — FUNGAL ORGANISM REFLEX

## 2020-01-17 LAB — FUNGUS CULTURE RESULT

## 2020-01-17 NOTE — Therapy (Signed)
[place], [place], [address] Phone: [phone]   Fax:  [phone]  Physical Therapy Treatment  Patient Details  Name: Sergio Dominguez MRN: [id number] Date of Birth: Oct 21, 1951 Referring Provider (PT): Oretha Milch   Encounter Date: 01/17/2020  PT End of Session - 01/17/20 1136    Visit Number  6    Date for PT Re-Evaluation  03/01/20    PT Start Time  1058    PT Stop Time  1138    PT Time Calculation (min)  40 min    Activity Tolerance  Patient tolerated treatment well    Behavior During Therapy  [facility] for tasks assessed/performed       Past Medical History:  Diagnosis Date  . Diabetes mellitus without complication [facility])     Past Surgical History:  Procedure Laterality Date  . APPENDECTOMY    . ELBOW SURGERY      There were no vitals filed for this visit.  Subjective Assessment - 01/17/20 1058    Subjective  Pt reports that he is just stiff    Currently in Pain?  No/denies                       [facility] Adult PT Treatment/Exercise - 01/17/20 0001      Lumbar Exercises: Aerobic   UBE (Upper Arm Bike)  L3 x 2 min each     Recumbent Bike  L1 x 5 min       Lumbar Exercises: Machines for Strengthening   Cybex Knee Extension  10lb 2x10, SL 5lb x 10 each    Cybex Knee Flexion  20lb 2x15      Lumbar Exercises: Standing   Row  Theraband;20 reps;Both    Theraband Level (Row)  Level 3 (Green)    Shoulder Extension  20 reps;Both;Power Tower;Theraband    Theraband Level (Shoulder Extension)  Level 3 (Green)    Other Standing Lumbar Exercises  ER yellow Tband 2x10       Lumbar Exercises: Seated   Sit to Stand  10 reps   from elevated UBE x2 with ball toss    Other Seated Lumbar Exercises  HS curls green Tband x15, x10               PT Short Term Goals - 12/31/19 1006      PT SHORT TERM GOAL #1   Title  independent with initial HEP    Time  2    Period   Weeks    Status  New        PT Long Term Goals - 01/14/20 1234      PT LONG TERM GOAL #1   Title  understand posture and body mechanics    Status  Achieved      PT LONG TERM GOAL #2   Title  get up from sitting without use of hands    Status  Partially Met            Plan - 01/17/20 1136    Clinical Impression Statement  Pt reports some increase tightness today, so backed off some interventions. He did well maintain stability with sit to stands and ball toss. Cues to stand erect with external rotation. Postural fatigue noted with rows and extensions using increase resistance.    Personal Factors and Comorbidities  Comorbidity 2    Comorbidities  DM, vertebral fractures    Examination-Activity Limitations  Lift;Squat;Stairs;Locomotion Level;Stand;Carry;Transfers    Examination-Participation Restrictions  Cleaning;Yard Work;Laundry;Shop    Stability/Clinical Decision Making  Evolving/Moderate complexity    Rehab Potential  Good    PT Duration  8 weeks    PT Treatment/Interventions  ADLs/Self Care Home Management;Electrical Stimulation;Moist Heat;Therapeutic activities;Therapeutic exercise;Balance training;Neuromuscular re-education;Manual techniques;Patient/family education    PT Next Visit Plan  slowly start exercise, limit bending and twisting, monitor and treat pain       Patient will benefit from skilled therapeutic intervention in order to improve the following deficits and impairments:  Abnormal gait, Pain, Improper body mechanics, Postural dysfunction, Increased muscle spasms, Decreased mobility, Decreased activity tolerance, Difficulty walking, Impaired flexibility, Decreased endurance, Decreased range of motion, Decreased strength  Visit Diagnosis: Difficulty in walking, not elsewhere classified  Muscle weakness (generalized)  Acute bilateral low back pain without sciatica  Pain in thoracic spine     Problem List Patient Active Problem List   Diagnosis Date  Noted  . Sinusitis 12/20/2019  . Acute encephalopathy 12/18/2019  . Fever 12/18/2019  . Compressed spine fracture ([place]) 12/18/2019  . Hyperglycemia 12/18/2019  . Diabetes mellitus type 2 in nonobese ([place]) 12/18/2019  . HLD (hyperlipidemia) 12/18/2019  . Rhabdomyolysis 12/18/2019  . Myoglobinuria 12/18/2019  . Leukocytosis 12/18/2019  . History of vertebral fracture 12/18/2019    Scot Jun, PTA 01/17/2020, 11:41 AM  [place], [place], [address] Phone: [phone]   Fax:  [phone]  Name: Dez Stauffer MRN: [id number] Date of Birth: 08/23/1951

## 2020-01-21 ENCOUNTER — Ambulatory Visit: Payer: Medicare PPO | Admitting: Physical Therapy

## 2020-01-28 ENCOUNTER — Other Ambulatory Visit: Payer: Self-pay

## 2020-01-28 ENCOUNTER — Encounter: Payer: Self-pay | Admitting: Physical Therapy

## 2020-01-28 ENCOUNTER — Ambulatory Visit: Payer: Medicare PPO | Attending: Internal Medicine | Admitting: Physical Therapy

## 2020-01-28 DIAGNOSIS — M6281 Muscle weakness (generalized): Secondary | ICD-10-CM | POA: Diagnosis present

## 2020-01-28 DIAGNOSIS — R262 Difficulty in walking, not elsewhere classified: Secondary | ICD-10-CM

## 2020-01-28 DIAGNOSIS — M545 Low back pain, unspecified: Secondary | ICD-10-CM

## 2020-01-28 DIAGNOSIS — M546 Pain in thoracic spine: Secondary | ICD-10-CM | POA: Diagnosis present

## 2020-01-28 NOTE — Therapy (Signed)
[place], [place], [address] Phone: [phone]   Fax:  [phone]  Physical Therapy Treatment  Patient Details  Name: Sergio Dominguez MRN: [id number] Date of Birth: August 25, 1951 Referring Provider (PT): Oretha Milch   Encounter Date: 01/28/2020  PT End of Session - 01/28/20 1229    Visit Number  7    Date for PT Re-Evaluation  03/01/20    PT Start Time  3704    PT Stop Time  1229    PT Time Calculation (min)  44 min    Activity Tolerance  Patient tolerated treatment well    Behavior During Therapy  [facility] for tasks assessed/performed       Past Medical History:  Diagnosis Date  . Diabetes mellitus without complication [facility])     Past Surgical History:  Procedure Laterality Date  . APPENDECTOMY    . ELBOW SURGERY      There were no vitals filed for this visit.  Subjective Assessment - 01/28/20 1144    Subjective  Feeling better than I did last week. To day he feels great    Currently in Pain?  No/denies                       [facility] Adult PT Treatment/Exercise - 01/28/20 0001      Lumbar Exercises: Aerobic   UBE (Upper Arm Bike)  L3 x 2 min each     Nustep  L 3 6 min      Lumbar Exercises: Machines for Strengthening   Cybex Knee Extension  10lb 2x10, SL 5lb  2x5    Cybex Knee Flexion  20lb 2x15      Lumbar Exercises: Standing   Shoulder Extension  20 reps;Both;Power Tower;Theraband    Shoulder Extension Limitations  5    Other Standing Lumbar Exercises  Step up 6 in x 10 each LE       Lumbar Exercises: Seated   Sit to Stand  20 reps   Round airex in blue chair, some low back discomfort    Other Seated Lumbar Exercises  Tricepts ext 20lb 2x15               PT Short Term Goals - 12/31/19 1006      PT SHORT TERM GOAL #1   Title  independent with initial HEP    Time  2    Period  Weeks    Status  New        PT Long Term Goals - 01/28/20 1202       PT LONG TERM GOAL #2   Title  get up from sitting without use of hands    Status  Partially Met            Plan - 01/28/20 1229    Clinical Impression Statement  Pt missed therapy last week, reports that he wasn't feeling well. Today he reports feeling good. All interventions complete well but he fatigues quick needing ample rest. Cues to maintain full ROM with seated HS curls and extensions. He did report some low back discomfort with sit to stands. Tactile cues to keep shoulders down with shoulder extensions.    Personal Factors and Comorbidities  Comorbidity 2    Comorbidities  DM, vertebral fractures    Examination-Activity Limitations  Lift;Squat;Stairs;Locomotion Level;Stand;Carry;Transfers    Examination-Participation Restrictions  Cleaning;Yard Work;Laundry;Shop    Stability/Clinical Decision Making  Evolving/Moderate complexity  Rehab Potential  Good    PT Frequency  2x / week    PT Duration  8 weeks    PT Treatment/Interventions  ADLs/Self Care Home Management;Electrical Stimulation;Moist Heat;Therapeutic activities;Therapeutic exercise;Balance training;Neuromuscular re-education;Manual techniques;Patient/family education    PT Next Visit Plan  slowly start exercise, limit bending and twisting, monitor and treat pain       Patient will benefit from skilled therapeutic intervention in order to improve the following deficits and impairments:  Abnormal gait, Pain, Improper body mechanics, Postural dysfunction, Increased muscle spasms, Decreased mobility, Decreased activity tolerance, Difficulty walking, Impaired flexibility, Decreased endurance, Decreased range of motion, Decreased strength  Visit Diagnosis: Difficulty in walking, not elsewhere classified  Muscle weakness (generalized)  Acute bilateral low back pain without sciatica  Pain in thoracic spine     Problem List Patient Active Problem List   Diagnosis Date Noted  . Sinusitis 12/20/2019  . Acute  encephalopathy 12/18/2019  . Fever 12/18/2019  . Compressed spine fracture ([place]) 12/18/2019  . Hyperglycemia 12/18/2019  . Diabetes mellitus type 2 in nonobese ([place]) 12/18/2019  . HLD (hyperlipidemia) 12/18/2019  . Rhabdomyolysis 12/18/2019  . Myoglobinuria 12/18/2019  . Leukocytosis 12/18/2019  . History of vertebral fracture 12/18/2019    Scot Jun, PTA 01/28/2020, 12:32 PM  [place], [place], [address] Phone: [phone]   Fax:  [phone]  Name: Sergio Dominguez MRN: [id number] Date of Birth: [birth year]

## 2020-02-04 ENCOUNTER — Ambulatory Visit: Payer: Medicare PPO | Admitting: Physical Therapy

## 2020-02-04 ENCOUNTER — Encounter: Payer: Self-pay | Admitting: Physical Therapy

## 2020-02-04 ENCOUNTER — Other Ambulatory Visit: Payer: Self-pay

## 2020-02-04 DIAGNOSIS — R262 Difficulty in walking, not elsewhere classified: Secondary | ICD-10-CM

## 2020-02-04 DIAGNOSIS — M6281 Muscle weakness (generalized): Secondary | ICD-10-CM

## 2020-02-04 NOTE — Therapy (Signed)
[place], [place], [address] Phone: [phone]   Fax:  [phone]  Physical Therapy Treatment  Patient Details  Name: Sergio Dominguez MRN: [id number] Date of Birth: [birth year] Referring Provider (PT): Oretha Milch   Encounter Date: 02/04/2020  PT End of Session - 02/04/20 1224    Visit Number  8    Date for PT Re-Evaluation  03/01/20    PT Start Time  1145    PT Stop Time  1225    PT Time Calculation (min)  40 min    Activity Tolerance  Patient tolerated treatment well    Behavior During Therapy  [facility] for tasks assessed/performed       Past Medical History:  Diagnosis Date  . Diabetes mellitus without complication [facility])     Past Surgical History:  Procedure Laterality Date  . APPENDECTOMY    . ELBOW SURGERY      There were no vitals filed for this visit.  Subjective Assessment - 02/04/20 1147    Subjective  "I am feeling good, Like I said when I come here I fel better"    Currently in Pain?  No/denies                       [facility] Adult PT Treatment/Exercise - 02/04/20 0001      Lumbar Exercises: Aerobic   UBE (Upper Arm Bike)  L3 x 2 min each     Nustep  L4 x 6 min       Lumbar Exercises: Machines for Strengthening   Cybex Knee Extension  10lb 2x10, SL 5lb  2x5    Cybex Knee Flexion  20lb 2x15      Lumbar Exercises: Standing   Row  Theraband;Both;15 reps   x2   Theraband Level (Row)  Level 4 (Blue)    Shoulder Extension  20 reps;Both;Power Tower;Theraband    Theraband Level (Shoulder Extension)  Level 4 (Blue)    Other Standing Lumbar Exercises  Lateral Step up 6 in x 10 each LE       Lumbar Exercises: Seated   Sit to Stand  20 reps   x2 from blue chair    Other Seated Lumbar Exercises  Tricepts ext 25lb 3x10               PT Short Term Goals - 12/31/19 1006      PT SHORT TERM GOAL #1   Title  independent with initial HEP    Time  2    Period  Weeks    Status  New        PT Long Term Goals - 02/04/20 1147      PT LONG TERM GOAL #2   Title  get up from sitting without use of hands            Plan - 02/04/20 1224    Clinical Impression Statement  Pt continues to progress well with his mobility. He is still wearing a back brace. He reports that he wears it it remind himself to stand up straight. No reports of pain. He does fatigue more with functional interventions. Tactile cues for posture with shoulder extensions.    Personal Factors and Comorbidities  Comorbidity 2    Comorbidities  DM, vertebral fractures    Examination-Activity Limitations  Lift;Squat;Stairs;Locomotion Level;Stand;Carry;Transfers    Examination-Participation Restrictions  Cleaning;Yard Work;Laundry;Shop    Stability/Clinical Decision Making  Evolving/Moderate complexity  Rehab Potential  Good    PT Frequency  2x / week    PT Duration  8 weeks    PT Treatment/Interventions  ADLs/Self Care Home Management;Electrical Stimulation;Moist Heat;Therapeutic activities;Therapeutic exercise;Balance training;Neuromuscular re-education;Manual techniques;Patient/family education    PT Next Visit Plan  slowly start exercise, limit bending and twisting, monitor and treat pain       Patient will benefit from skilled therapeutic intervention in order to improve the following deficits and impairments:  Abnormal gait, Pain, Improper body mechanics, Postural dysfunction, Increased muscle spasms, Decreased mobility, Decreased activity tolerance, Difficulty walking, Impaired flexibility, Decreased endurance, Decreased range of motion, Decreased strength  Visit Diagnosis: Muscle weakness (generalized)  Difficulty in walking, not elsewhere classified     Problem List Patient Active Problem List   Diagnosis Date Noted  . Sinusitis 12/20/2019  . Acute encephalopathy 12/18/2019  . Fever 12/18/2019  . Compressed spine fracture ([facility]) 12/18/2019  .  Hyperglycemia 12/18/2019  . Diabetes mellitus type 2 in nonobese ([facility]) 12/18/2019  . HLD (hyperlipidemia) 12/18/2019  . Rhabdomyolysis 12/18/2019  . Myoglobinuria 12/18/2019  . Leukocytosis 12/18/2019  . History of vertebral fracture 12/18/2019    Grayce Sessions, PTA 02/04/2020, 12:26 PM  [facility] [address] [place], [place], [address] Phone: [phone]   Fax:  [phone]  Name: Sergio Dominguez MRN: [id number] Date of Birth: 11/18/1951

## 2020-02-07 ENCOUNTER — Encounter: Payer: Medicare PPO | Admitting: Physical Therapy

## 2020-02-11 ENCOUNTER — Encounter: Payer: Medicare PPO | Admitting: Physical Therapy

## 2020-02-13 ENCOUNTER — Ambulatory Visit: Payer: Medicare PPO | Admitting: Physical Therapy

## 2020-02-14 ENCOUNTER — Encounter: Payer: Medicare PPO | Admitting: Physical Therapy

## 2020-02-18 ENCOUNTER — Ambulatory Visit: Payer: Medicare PPO | Admitting: Physical Therapy

## 2020-02-18 ENCOUNTER — Encounter: Payer: Self-pay | Admitting: Physical Therapy

## 2020-02-18 ENCOUNTER — Other Ambulatory Visit: Payer: Self-pay

## 2020-02-18 DIAGNOSIS — M545 Low back pain, unspecified: Secondary | ICD-10-CM

## 2020-02-18 DIAGNOSIS — M6281 Muscle weakness (generalized): Secondary | ICD-10-CM

## 2020-02-18 DIAGNOSIS — R262 Difficulty in walking, not elsewhere classified: Secondary | ICD-10-CM | POA: Diagnosis not present

## 2020-02-18 NOTE — Therapy (Signed)
[place], [place], [address] Phone: [phone]   Fax:  [phone]  Physical Therapy Treatment  Patient Details  Name: Sergio Dominguez MRN: [id number] Date of Birth: Aug 19, 1951 Referring Provider (PT): Oretha Milch   Encounter Date: 02/18/2020  PT End of Session - 02/18/20 1132    Visit Number  9    Date for PT Re-Evaluation  03/01/20    PT Start Time  1100    PT Stop Time  1140    PT Time Calculation (min)  40 min    Activity Tolerance  Patient tolerated treatment well    Behavior During Therapy  [facility] for tasks assessed/performed       Past Medical History:  Diagnosis Date  . Diabetes mellitus without complication [facility])     Past Surgical History:  Procedure Laterality Date  . APPENDECTOMY    . ELBOW SURGERY      There were no vitals filed for this visit.  Subjective Assessment - 02/18/20 1102    Subjective  "Doing good" Ambulated in without brace, has not had brace on for a week    Currently in Pain?  No/denies                       [facility] Adult PT Treatment/Exercise - 02/18/20 0001      Lumbar Exercises: Aerobic   UBE (Upper Arm Bike)  L3 x 2 min each     Nustep  L5 x 6 min       Lumbar Exercises: Machines for Strengthening   Cybex Knee Extension  10lb 2x10     Cybex Knee Flexion  20lb 2x15    Other Lumbar Machine Exercise  Rows & Lats 20lb 2x10     Other Lumbar Machine Exercise  Chest press 5lb 2x10       Lumbar Exercises: Seated   Sit to Stand  10 reps   x2, from UBE seat 2ns set airex under feet   Other Seated Lumbar Exercises  Tricepts ext 25lb 3x10               PT Short Term Goals - 12/31/19 1006      PT SHORT TERM GOAL #1   Title  independent with initial HEP    Time  2    Period  Weeks    Status  New        PT Long Term Goals - 02/18/20 1132      PT LONG TERM GOAL #2   Title  get up from sitting without use of hands    Status  Partially Met            Plan - 02/18/20 1135    Clinical Impression Statement  Pt did good overall today. Limited any drastic increase in resistance to avoid muscles soreness and discomfort. He reports that he has stopped wearing his brace for the past weak, so his muscles are being more activated overall. No reports of pain today, cues needed to slow down with some interventions.    Personal Factors and Comorbidities  Comorbidity 2    Comorbidities  DM, vertebral fractures    Examination-Activity Limitations  Lift;Squat;Stairs;Locomotion Level;Stand;Carry;Transfers    Examination-Participation Restrictions  Cleaning;Yard Work;Laundry;Shop    Stability/Clinical Decision Making  Evolving/Moderate complexity    Rehab Potential  Good    PT Frequency  2x / week    PT Duration  8 weeks  PT Treatment/Interventions  ADLs/Self Care Home Management;Electrical Stimulation;Moist Heat;Therapeutic activities;Therapeutic exercise;Balance training;Neuromuscular re-education;Manual techniques;Patient/family education    PT Next Visit Plan  slowly start exercise, limit bending and twisting, monitor and treat pain       Patient will benefit from skilled therapeutic intervention in order to improve the following deficits and impairments:  Abnormal gait, Pain, Improper body mechanics, Postural dysfunction, Increased muscle spasms, Decreased mobility, Decreased activity tolerance, Difficulty walking, Impaired flexibility, Decreased endurance, Decreased range of motion, Decreased strength  Visit Diagnosis: Difficulty in walking, not elsewhere classified  Acute bilateral low back pain without sciatica  Muscle weakness (generalized)     Problem List Patient Active Problem List   Diagnosis Date Noted  . Sinusitis 12/20/2019  . Acute encephalopathy 12/18/2019  . Fever 12/18/2019  . Compressed spine fracture ([place]) 12/18/2019  . Hyperglycemia 12/18/2019  . Diabetes mellitus type 2 in  nonobese ([place]) 12/18/2019  . HLD (hyperlipidemia) 12/18/2019  . Rhabdomyolysis 12/18/2019  . Myoglobinuria 12/18/2019  . Leukocytosis 12/18/2019  . History of vertebral fracture 12/18/2019    Scot Jun, PTA 02/18/2020, 11:37 AM  [place], [place], [address] Phone: [phone]   Fax:  [phone]  Name: Sergio Dominguez MRN: [id number] Date of Birth: 03/19/1951

## 2020-02-25 ENCOUNTER — Ambulatory Visit: Payer: Medicare PPO | Attending: Internal Medicine | Admitting: Physical Therapy

## 2020-02-25 ENCOUNTER — Encounter: Payer: Self-pay | Admitting: Physical Therapy

## 2020-02-25 ENCOUNTER — Other Ambulatory Visit: Payer: Self-pay

## 2020-02-25 DIAGNOSIS — M545 Low back pain, unspecified: Secondary | ICD-10-CM

## 2020-02-25 DIAGNOSIS — R262 Difficulty in walking, not elsewhere classified: Secondary | ICD-10-CM | POA: Insufficient documentation

## 2020-02-25 NOTE — Therapy (Signed)
[place], [place], [address] Phone: [phone]   Fax:  [phone] Progress Note Reporting Period 12/31/19 to 02/25/20 for the first 10 visits See note below for Objective Data and Assessment of Progress/Goals.      Physical Therapy Treatment  Patient Details  Name: Sergio Dominguez MRN: [id number] Date of Birth: 1951-12-24 Referring Provider (PT): Oretha Milch   Encounter Date: 02/25/2020  PT End of Session - 02/25/20 1137    Visit Number  10    Date for PT Re-Evaluation  03/01/20    PT Start Time  1100    PT Stop Time  1140    PT Time Calculation (min)  40 min       Past Medical History:  Diagnosis Date  . Diabetes mellitus without complication [facility])     Past Surgical History:  Procedure Laterality Date  . APPENDECTOMY    . ELBOW SURGERY      There were no vitals filed for this visit.  Subjective Assessment - 02/25/20 1102    Subjective  "Im doing great" "I just have to be patient with my back "    Currently in Pain?  No/denies                       [facility] Adult PT Treatment/Exercise - 02/25/20 0001      Ambulation/Gait   Stairs  Yes    Stairs Assistance  7: Independent    Stair Management Technique  No rails;Alternating pattern;Forwards    Number of Stairs  48    Height of Stairs  6      Lumbar Exercises: Aerobic   Stationary Bike  L1 x 5 min     UBE (Upper Arm Bike)  L4 x 2 min each       Lumbar Exercises: Machines for Strengthening   Cybex Knee Extension  15lb 2x10     Cybex Knee Flexion  25lb 2x10     Other Lumbar Machine Exercise  Rows & Lats 25lb 2x10     Other Lumbar Machine Exercise  Chest press 5lb 2x10       Lumbar Exercises: Standing   Shoulder Extension  20 reps;Both;Power Tower;Theraband    Shoulder Extension Limitations  10               PT Short Term Goals - 12/31/19 1006      PT SHORT TERM GOAL #1   Title  independent with  initial HEP    Time  2    Period  Weeks    Status  New        PT Long Term Goals - 02/25/20 1121      PT LONG TERM GOAL #1   Title  understand posture and body mechanics    Status  Achieved      PT LONG TERM GOAL #2   Title  get up from sitting without use of hands    Status  Partially Met            Plan - 02/25/20 1140    Clinical Impression Statement  Pt is progressing well. he report no pain throughout session. Progressed to stair negotiation alternating pattern without rail. Increase resistance tolerated on all machine level interventions. He reports that she has been doing fine not wearing his back brace. Reports some difficulty bending over to clean up after dog.    Personal Factors and  Comorbidities  Comorbidity 2    Comorbidities  DM, vertebral fractures    Examination-Activity Limitations  Lift;Squat;Stairs;Locomotion Level;Stand;Carry;Transfers    Examination-Participation Restrictions  Cleaning;Yard Work;Laundry;Shop    Stability/Clinical Decision Making  Evolving/Moderate complexity    Rehab Potential  Good    PT Frequency  2x / week    PT Duration  8 weeks    PT Treatment/Interventions  ADLs/Self Care Home Management;Electrical Stimulation;Moist Heat;Therapeutic activities;Therapeutic exercise;Balance training;Neuromuscular re-education;Manual techniques;Patient/family education    PT Next Visit Plan  slowly start exercise, limit bending and twisting, monitor and treat pain       Patient will benefit from skilled therapeutic intervention in order to improve the following deficits and impairments:  Abnormal gait, Pain, Improper body mechanics, Postural dysfunction, Increased muscle spasms, Decreased mobility, Decreased activity tolerance, Difficulty walking, Impaired flexibility, Decreased endurance, Decreased range of motion, Decreased strength  Visit Diagnosis: Acute bilateral low back pain without sciatica  Difficulty in walking, not elsewhere  classified     Problem List Patient Active Problem List   Diagnosis Date Noted  . Sinusitis 12/20/2019  . Acute encephalopathy 12/18/2019  . Fever 12/18/2019  . Compressed spine fracture ([place]) 12/18/2019  . Hyperglycemia 12/18/2019  . Diabetes mellitus type 2 in nonobese ([place]) 12/18/2019  . HLD (hyperlipidemia) 12/18/2019  . Rhabdomyolysis 12/18/2019  . Myoglobinuria 12/18/2019  . Leukocytosis 12/18/2019  . History of vertebral fracture 12/18/2019    Scot Jun, PTA 02/25/2020, 11:42 AM  [place], [place], [address] Phone: [phone]   Fax:  [phone]  Name: Emmanual Gauthreaux MRN: [id number] Date of Birth: [birth year]

## 2020-02-29 ENCOUNTER — Emergency Department (HOSPITAL_COMMUNITY): Payer: Medicare PPO

## 2020-02-29 ENCOUNTER — Encounter (HOSPITAL_COMMUNITY): Payer: Self-pay | Admitting: Emergency Medicine

## 2020-02-29 ENCOUNTER — Emergency Department (HOSPITAL_COMMUNITY)
Admission: EM | Admit: 2020-02-29 | Discharge: 2020-02-29 | Disposition: A | Discharge status: Home / Self Care | Payer: Medicare PPO | Attending: Emergency Medicine | Admitting: Emergency Medicine

## 2020-02-29 DIAGNOSIS — R569 Unspecified convulsions: Secondary | ICD-10-CM | POA: Insufficient documentation

## 2020-02-29 DIAGNOSIS — Z7984 Long term (current) use of oral hypoglycemic drugs: Secondary | ICD-10-CM | POA: Diagnosis not present

## 2020-02-29 DIAGNOSIS — E119 Type 2 diabetes mellitus without complications: Secondary | ICD-10-CM | POA: Diagnosis not present

## 2020-02-29 DIAGNOSIS — Z79899 Other long term (current) drug therapy: Secondary | ICD-10-CM | POA: Insufficient documentation

## 2020-02-29 LAB — CBC WITH DIFFERENTIAL/PLATELET
Abs Immature Granulocytes: 0.03 10*3/uL (ref 0.00–0.07)
Basophils Absolute: 0.1 10*3/uL (ref 0.0–0.1)
Basophils Relative: 1 %
Eosinophils Absolute: 0.3 10*3/uL (ref 0.0–0.5)
Eosinophils Relative: 4 %
HCT: 44.1 % (ref 39.0–52.0)
Hemoglobin: 13.7 g/dL (ref 13.0–17.0)
Immature Granulocytes: 0 %
Lymphocytes Relative: 10 %
Lymphs Abs: 0.7 10*3/uL (ref 0.7–4.0)
MCH: 30.3 pg (ref 26.0–34.0)
MCHC: 31.1 g/dL (ref 30.0–36.0)
MCV: 97.6 fL (ref 80.0–100.0)
Monocytes Absolute: 0.4 10*3/uL (ref 0.1–1.0)
Monocytes Relative: 6 %
Neutro Abs: 5.5 10*3/uL (ref 1.7–7.7)
Neutrophils Relative %: 79 %
Platelets: 200 10*3/uL (ref 150–400)
RBC: 4.52 MIL/uL (ref 4.22–5.81)
RDW: 13.6 % (ref 11.5–15.5)
WBC: 7 10*3/uL (ref 4.0–10.5)
nRBC: 0 % (ref 0.0–0.2)

## 2020-02-29 LAB — COMPREHENSIVE METABOLIC PANEL
ALT: 19 U/L (ref 0–44)
AST: 17 U/L (ref 15–41)
Albumin: 3.6 g/dL (ref 3.5–5.0)
Alkaline Phosphatase: 96 U/L (ref 38–126)
Anion gap: 12 (ref 5–15)
BUN: 10 mg/dL (ref 8–23)
CO2: 22 mmol/L (ref 22–32)
Calcium: 9.1 mg/dL (ref 8.9–10.3)
Chloride: 107 mmol/L (ref 98–111)
Creatinine, Ser: 0.89 mg/dL (ref 0.61–1.24)
GFR calc Af Amer: >60 mL/min (ref >60)
GFR calc non Af Amer: >60 mL/min (ref >60)
Glucose, Bld: 186 mg/dL — ABNORMAL HIGH (ref 70–99)
Potassium: 4.1 mmol/L (ref 3.5–5.1)
Sodium: 141 mmol/L (ref 135–145)
Total Bilirubin: 0.5 mg/dL (ref 0.3–1.2)
Total Protein: 7 g/dL (ref 6.5–8.1)

## 2020-02-29 LAB — URINALYSIS, COMPLETE (UACMP) WITH MICROSCOPIC
Bacteria, UA: NONE SEEN
Bilirubin Urine: NEGATIVE
Glucose, UA: 150 mg/dL — AB
Ketones, ur: NEGATIVE mg/dL
Leukocytes,Ua: NEGATIVE
Nitrite: NEGATIVE
Protein, ur: 30 mg/dL — AB
Specific Gravity, Urine: 1.018 (ref 1.005–1.030)
pH: 5 (ref 5.0–8.0)

## 2020-02-29 LAB — RAPID URINE DRUG SCREEN, HOSP PERFORMED
Amphetamines: NOT DETECTED
Barbiturates: NOT DETECTED
Benzodiazepines: NOT DETECTED
Cocaine: NOT DETECTED
Opiates: NOT DETECTED
Tetrahydrocannabinol: POSITIVE — AB

## 2020-02-29 LAB — MAGNESIUM: Magnesium: 1.9 mg/dL (ref 1.7–2.4)

## 2020-02-29 MED ORDER — LEVETIRACETAM IN NACL 1500 MG/100ML IV SOLN
1500.0000 mg | Freq: Once | INTRAVENOUS | Status: AC
  Administered 2020-02-29: 1500 mg via INTRAVENOUS
  Filled 2020-02-29: qty 100

## 2020-02-29 MED ORDER — LEVETIRACETAM 500 MG PO TABS: 500.0000 mg | ORAL_TABLET | Freq: Two times a day (BID) | ORAL | 0 refills | Status: DC

## 2020-02-29 NOTE — ED Provider Notes (Signed)
[facility] EMERGENCY DEPARTMENT Provider Note   CSN: [id number] Arrival date & time: 02/29/20  8546     History Chief Complaint  Patient presents with  . Seizures    Sergio Dominguez is a 69 y.o. male.  HPI 69 year old male presents with seizure.  Per report, patient had seizure-like activity witnessed by his wife at home.  Was combative when EMS first arrived.  Now acting more normal.  Patient denies a known history of seizures.  He does not remember what happened this morning.  He states he might have a slight headache but otherwise denies any acute complaints.  Has some chronic right lower quadrant abdominal discomfort and occasional swelling that might be a hernia but otherwise no new or concerning symptoms.  Discussed with wife over the phone, and she noticed him moaning and his arms were rigid with seizure like activity in bed this morning. No prior history of seizures, but was found altered on floor back in December.   Past Medical History:  Diagnosis Date  . Diabetes mellitus without complication [facility])     Patient Active Problem List   Diagnosis Date Noted  . Sinusitis 12/20/2019  . Acute encephalopathy 12/18/2019  . Fever 12/18/2019  . Compressed spine fracture (HCC) 12/18/2019  . Hyperglycemia 12/18/2019  . Diabetes mellitus type 2 in nonobese (HCC) 12/18/2019  . HLD (hyperlipidemia) 12/18/2019  . Rhabdomyolysis 12/18/2019  . Myoglobinuria 12/18/2019  . Leukocytosis 12/18/2019  . History of vertebral fracture 12/18/2019    Past Surgical History:  Procedure Laterality Date  . APPENDECTOMY    . ELBOW SURGERY         Family History  Problem Relation Age of Onset  . Multiple sclerosis Mother     Social History   Tobacco Use  . Smoking status: Never Smoker  . Smokeless tobacco: Never Used  Substance Use Topics  . Alcohol use: Not on file  . Drug use: Not on file    Home Medications Prior to Admission medications   Medication Sig  Start Date End Date Taking? Authorizing Provider  Alpha-Lipoic Acid 200 MG CAPS Take 200 mg by mouth daily.   Yes [provider]  cetirizine (ZYRTEC) 10 MG tablet Take 10 mg by mouth daily.   Yes [provider]  metFORMIN (GLUCOPHAGE) 500 MG tablet Take 1,000 mg by mouth daily.  12/23/19  Yes [provider]  zinc gluconate 50 MG tablet Take 50 mg by mouth daily.   Yes [provider]  levETIRAcetam (KEPPRA) 500 MG tablet Take 1 tablet (500 mg total) by mouth 2 (two) times daily. 02/29/20   Pricilla Loveless, MD    Allergies    Oxycodone hcl  Review of Systems   Review of Systems  Constitutional: Negative for fever.  Gastrointestinal: Negative for diarrhea and vomiting.  Neurological: Positive for seizures and headaches.  All other systems reviewed and are negative.   Physical Exam Updated Vital Signs BP 126/89 (BP Location: Right Arm)   Pulse 86   Temp 97.7 F (36.5 C) (Oral)   Resp 20   SpO2 98%   Physical Exam Vitals and nursing note reviewed.  Constitutional:      General: He is not in acute distress.    Appearance: He is well-developed. He is not ill-appearing or diaphoretic.  HENT:     Head: Normocephalic and atraumatic.     Right Ear: External ear normal.     Left Ear: External ear normal.  Nose: Nose normal.  Eyes:     General:        Right eye: No discharge.        Left eye: No discharge.     Pupils: Pupils are equal, round, and reactive to light.  Cardiovascular:     Rate and Rhythm: Normal rate and regular rhythm.     Heart sounds: Normal heart sounds.  Pulmonary:     Effort: Pulmonary effort is normal.     Breath sounds: Normal breath sounds.  Abdominal:     Palpations: Abdomen is soft.     Tenderness: There is no abdominal tenderness.  Musculoskeletal:     Cervical back: Neck supple.  Skin:    General: Skin is warm and dry.  Neurological:     Mental Status: He is alert and oriented to person, place, and time.       Comments: CN 3-12 grossly intact. 5/5 strength in all 4 extremities. Grossly normal sensation. Normal finger to nose.   Psychiatric:        Mood and Affect: Mood is not anxious.     ED Results / Procedures / Treatments   Labs (all labs ordered are listed, but only abnormal results are displayed) Labs Reviewed  COMPREHENSIVE METABOLIC PANEL - Abnormal; Notable for the following components:      Result Value   Glucose, Bld 186 (*)    All other components within normal limits  RAPID URINE DRUG SCREEN, HOSP PERFORMED - Abnormal; Notable for the following components:   Tetrahydrocannabinol POSITIVE (*)    All other components within normal limits  URINALYSIS, COMPLETE (UACMP) WITH MICROSCOPIC - Abnormal; Notable for the following components:   Glucose, UA 150 (*)    Hgb urine dipstick SMALL (*)    Protein, ur 30 (*)    All other components within normal limits  CBC WITH DIFFERENTIAL/PLATELET  MAGNESIUM    EKG EKG Interpretation  Date/Time:  Saturday February 29 2020 09:07:00 EST Ventricular Rate:  87 PR Interval:    QRS Duration: 85 QT Interval:  364 QTC Calculation: 438 R Axis:   49 Text Interpretation: Sinus rhythm nonspecific T waves similar to 2020 Confirmed by Sherwood Gambler [phone]) on 02/29/2020 9:40:57 AM   Radiology CT HEAD WO CONTRAST  Result Date: 02/29/2020 CLINICAL DATA:  Seizure EXAM: CT HEAD WITHOUT CONTRAST TECHNIQUE: Contiguous axial images were obtained from the base of the skull through the vertex without intravenous contrast. COMPARISON:  12/17/2019 FINDINGS: Brain: There is no acute intracranial hemorrhage, mass-effect, or edema. Gray-white differentiation is preserved. There is no extra-axial fluid collection. Patchy hypoattenuation in the supratentorial white matter is nonspecific but may reflect mild chronic microvascular ischemic changes. Ventricles and sulci are within normal limits in size and configuration. Vascular: No hyperdense vessel or unexpected  calcification. Skull: Calvarium is unremarkable. Sinuses/Orbits: Moderate ethmoid mucosal thickening. Otherwise mild paranasal sinus mucosal thickening. Orbits are unremarkable. Other: Mastoid air cells are clear. IMPRESSION: No acute intracranial abnormality. Electronically Signed   By: Macy Mis M.D.   On: 02/29/2020 10:07    Procedures Procedures (including critical care time)  Medications Ordered in ED Medications  levETIRAcetam (KEPPRA) IVPB 1500 mg/ 100 mL premix (0 mg Intravenous Stopped 02/29/20 1205)    ED Course  I have reviewed the triage vital signs and the nursing notes.  Pertinent labs & imaging results that were available during my care of the patient were reviewed by me and considered in my medical decision making (see chart for details).  MDM Rules/Calculators/A&P                      No seizure-like activity in the ED.  Labs are overall reassuring.  Patient had an unknown altered mental status episode a few months ago.  He had prolonged encephalopathy, but it was still unclear in etiology.  Discussed with Dr. Laurence Slate.  Given the unclear nature of last time, along with witnessed seizure today, he recommends loading with Keppra and treating with Keppra.  Outpatient follow-up with neurology.  No driving. Final Clinical Impression(s) / ED Diagnoses Final diagnoses:  Seizure (HCC)    Rx / DC Orders ED Discharge Orders         Ordered    levETIRAcetam (KEPPRA) 500 MG tablet  2 times daily     02/29/20 1142    Ambulatory referral to Neurology    Comments: An appointment is requested in approximately: 2 weeks   02/29/20 1142           Pricilla Loveless, MD 02/29/20 1505

## 2020-02-29 NOTE — ED Notes (Signed)
D/c documents reviewed with patient and wife(over the phone)  Emphasis on adherence to Keppra Emphasized the importance of following up with Neurologist Emphasized the importance of following d/c instruction in regards to driving and operating equipments

## 2020-02-29 NOTE — ED Triage Notes (Signed)
PT arrived by [facility] with reports of seizure according to wife EMS reports that PT was combative on arrival, solemn in route but has increasingly become more alert and oriented  On arrival to ED, patient is alert, oriented to self, place; was able to tell this RN that he was told that he had seizure in December but does not remember it. He also is unable to tell this RN what he did before seizure.

## 2020-02-29 NOTE — ED Notes (Signed)
Walked patient to the bathroom patient did well 

## 2020-02-29 NOTE — Discharge Instructions (Addendum)
-   According to [place] law, you can not drive unless you are seizure / syncope free for at least 6 months and under physician's care.  °  °- Please maintain precautions. Do not participate in activities where a loss of awareness could harm you or someone else. No swimming alone, no tub bathing, no hot tubs, no driving, no operating motorized vehicles (cars, ATVs, motocycles, etc), lawnmowers, power tools or firearms. No standing at heights, such as rooftops, ladders or stairs. Avoid hot objects such as stoves, heaters, open fires. Wear a helmet when riding a bicycle, scooter, skateboard, etc. and avoid areas of traffic. Set your water heater to 120 degrees or less.  °

## 2020-03-27 ENCOUNTER — Other Ambulatory Visit: Payer: Self-pay

## 2020-03-27 ENCOUNTER — Encounter: Payer: Self-pay | Admitting: Neurology

## 2020-03-27 ENCOUNTER — Ambulatory Visit: Payer: Medicare PPO | Admitting: Neurology

## 2020-03-27 VITALS — BP 114/77 | HR 96 | Temp 97.5°F | Ht 75.0 in | Wt 149.0 lb

## 2020-03-27 DIAGNOSIS — G4700 Insomnia, unspecified: Secondary | ICD-10-CM

## 2020-03-27 DIAGNOSIS — R569 Unspecified convulsions: Secondary | ICD-10-CM | POA: Diagnosis not present

## 2020-03-27 DIAGNOSIS — Z8781 Personal history of (healed) traumatic fracture: Secondary | ICD-10-CM | POA: Diagnosis not present

## 2020-03-27 DIAGNOSIS — E1142 Type 2 diabetes mellitus with diabetic polyneuropathy: Secondary | ICD-10-CM

## 2020-03-27 DIAGNOSIS — M25511 Pain in right shoulder: Secondary | ICD-10-CM | POA: Insufficient documentation

## 2020-03-27 DIAGNOSIS — R200 Anesthesia of skin: Secondary | ICD-10-CM | POA: Diagnosis not present

## 2020-03-27 MED ORDER — GABAPENTIN 300 MG PO CAPS: 300.0000 mg | ORAL_CAPSULE | Freq: Three times a day (TID) | ORAL | 11 refills | Status: DC

## 2020-03-27 MED ORDER — LEVETIRACETAM 750 MG PO TABS: 750.0000 mg | ORAL_TABLET | Freq: Two times a day (BID) | ORAL | 11 refills | Status: DC

## 2020-03-27 NOTE — Progress Notes (Signed)
[facility]  PATIENT: Sergio Dominguez DOB: Jul 03, 1951  REFERRING DOCTOR OR PCP: Sherwood Gambler (ED); Bernerd Limbo (PCP) SOURCE: Patient, notes from 2 emergency room visits and December 2020 hospital admission, extensive imaging and laboratory reports, MRI and CT images personally reviewed  _________________________________   HISTORICAL  CHIEF COMPLAINT:  Chief Complaint  Patient presents with  . New Patient (Initial Visit)    seizure, referral from ED. He states he had an episode on 12/22 and spent 4 days in the hospital. His wife called him several times and then came home and found him on the floor. He didn't remember anything for the first two days. He says he woke up Thurs AM in the hospital bed. His wife says he was awake for the 2 days. The last episode happened on 02/29/20. He woke up in the ambulance. He states his wife says he was rigid and had his teeth clenched. Since 12/22 he can't sleep on his R shoulder.   . Referral    Sherwood Gambler, MD from [facility] ED  . Room 12    here alone has mask on, wife in waiting room.    HISTORY OF PRESENT ILLNESS:  I had the pleasure of seeing patient, Sergio Dominguez, at [facility] for neurologic consultation regarding his 2 episodes of unresponsiveness.  He is a 69 year old man who had suspected seizures 12/17/2019.   His wife was living with the daughter during the week and him on weekends (new grandchild).   He recalls planning to make breakfast and his wife told him she called several times that morning.   He was smoking marijuana at the time regularly.   Around 11 am he noted the car alarm was going off and he opened the door to check the car.   He does not recall after that.  His wife found him unresponsive in the second floor and he had urinated on himself.  He was rigid and teeth clenched.   There also were changes downstairs.   He had knocked over a planter and broke a picture frame.  His wife  called the ambulance and he regained consciousness and he went to the bathroom (he has no memory of this).    He was taken to the ED.   He has no memory of about 48 hours while in the hospital.  He had MRIs showing no acute brain lesions and mildly advanced for age changes.    MRI of the spine showed T1, T3-T6, T9, L1, L2, L3, L5 endplate compression fractures and interspinous ligament injury T3-T4 and T6-T7.    Tox screen showed THC only.   He has increased WBC (20.9 mostly neutrophil) and elevated sugars and HgbA1c    On 02/29/2020, he had an episode out of sleep with shaking and unresponsiveness.   No tongue biting or urination.   He woke up in the ambulance mildly confused.   He had smoked a small amount of MJ that week (after not smoking any Jan or Feb).   He was placed on Keppra 500 mg po bid and discharged home.  CT showed no acute.  He sleeps poorly due to waking up in the middle of the night and not falling back asleep.    EEG 12/18/2019 showed slow generalized activity c/w mild to moderate diffuse encephalopathy and excessive beta (likely benzodiazepine)  I personally reviewed the images of the brain, spine.  MRI brain 12/19/2019 showed atrophy and chronic microvascular ischemic changes,  a little more than typical.   He has sinusitis.  MRI of the spine showed T1, T3-T6, T9, L1, L2, L3, L5 endplate compression fractures and interspinous ligament injury T3-T4 and T6-T7.  Due to edema noted on the STIR images, these are acute to subacute  REVIEW OF SYSTEMS: Constitutional: No fevers, chills, sweats, or change in appetite.  He has insomnia. Eyes: No visual changes, double vision, eye pain Ear, nose and throat: No hearing loss, ear pain, nasal congestion, sore throat Cardiovascular: No chest pain, palpitations Respiratory: No shortness of breath at rest or with exertion.   No wheezes GastrointestinaI: No nausea, vomiting, diarrhea, abdominal pain, fecal incontinence Genitourinary: No dysuria,  urinary retention or frequency.  No nocturia. Musculoskeletal: still has pain in back with stiffness.   Right shoulder pain Integumentary: No rash, pruritus, skin lesions Neurological: as above Psychiatric: No depression at this time.  No anxiety Endocrine: He has diabetes.  He had not been treated until recently.  No palpitations, diaphoresis, change in appetite, change in weigh or increased thirst Hematologic/Lymphatic: No anemia, purpura, petechiae. Allergic/Immunologic: No itchy/runny eyes, nasal congestion, recent allergic reactions, rashes  ALLERGIES: Allergies  Allergen Reactions  . Oxycodone Hcl     REACTION: NAUSEA/VOMITNG    HOME MEDICATIONS:  Current Outpatient Medications:  .  Alpha-Lipoic Acid 200 MG CAPS, Take 200 mg by mouth daily., Disp: , Rfl:  .  cetirizine (ZYRTEC) 10 MG tablet, Take 10 mg by mouth daily., Disp: , Rfl:  .  levETIRAcetam (KEPPRA) 750 MG tablet, Take 1 tablet (750 mg total) by mouth 2 (two) times daily., Disp: 60 tablet, Rfl: 11 .  metFORMIN (GLUCOPHAGE) 500 MG tablet, Take 1,000 mg by mouth daily. , Disp: , Rfl:  .  zinc gluconate 50 MG tablet, Take 50 mg by mouth daily., Disp: , Rfl:  .  gabapentin (NEURONTIN) 300 MG capsule, Take 1 capsule (300 mg total) by mouth 3 (three) times daily., Disp: 30 capsule, Rfl: 11  PAST MEDICAL HISTORY: Past Medical History:  Diagnosis Date  . Diabetes mellitus without complication ([place])     PAST SURGICAL HISTORY: Past Surgical History:  Procedure Laterality Date  . "hairlip"  1961  . APPENDECTOMY    . ELBOW SURGERY      FAMILY HISTORY: Family History  Problem Relation Age of Onset  . Multiple sclerosis Mother   . Multiple sclerosis Brother   . Lung cancer Brother   . Seizures Neg Hx     SOCIAL HISTORY:  Social History   Socioeconomic History  . Marital status: Married    Spouse name: Not on file  . Number of children: 4  . Years of education: associates  . Highest education level: Not on  file  Occupational History  . Not on file  Tobacco Use  . Smoking status: Former Smoker    Quit date: 2012    Years since quitting: 9.2  . Smokeless tobacco: Never Used  Substance and Sexual Activity  . Alcohol use: Not Currently    Comment: none since 2019  . Drug use: Not Currently    Types: Marijuana    Comment: stopped 12/17/2019. has smoked 1 since then.  . Sexual activity: Not on file  Other Topics Concern  . Not on file  Social History Narrative   Lives at home with wife   Right handed   Caffeine: at least 3 cups per day   Social Determinants of Health   Financial Resource Strain:   . Difficulty of  Paying Living Expenses:   Food Insecurity:   . Worried About Charity fundraiser in the Last Year:   . Arboriculturist in the Last Year:   Transportation Needs:   . Film/video editor (Medical):   Marland Kitchen Lack of Transportation (Non-Medical):   Physical Activity:   . Days of Exercise per Week:   . Minutes of Exercise per Session:   Stress:   . Feeling of Stress :   Social Connections:   . Frequency of Communication with Friends and Family:   . Frequency of Social Gatherings with Friends and Family:   . Attends Religious Services:   . Active Member of Clubs or Organizations:   . Attends Archivist Meetings:   Marland Kitchen Marital Status:   Intimate Partner Violence:   . Fear of Current or Ex-Partner:   . Emotionally Abused:   Marland Kitchen Physically Abused:   . Sexually Abused:      PHYSICAL EXAM  Vitals:   03/27/20 0841  BP: 114/77  Pulse: 96  Temp: (!) 97.5 F (36.4 C)  Weight: 149 lb (67.6 kg)  Height: 6' 3"  (1.905 m)    Body mass index is 18.62 kg/m.   General: The patient is well-developed and well-nourished and in no acute distress  HEENT:  Head is [place]/AT.  Sclera are anicteric.  Funduscopic exam shows normal optic discs and retinal vessels.  Neck: No carotid bruits are noted.  The neck is nontender.  Cardiovascular: The heart has a regular rate and  rhythm with a normal S1 and S2. There were no murmurs, gallops or rubs.    Skin: Extremities are without rash or  edema.  Musculoskeletal:  Back is nontender  Neurologic Exam  Mental status: The patient is alert and oriented x 3 at the time of the examination. The patient has apparent normal recent and remote memory, with an apparently normal attention span and concentration ability.   Speech is normal.  Cranial nerves: Extraocular movements are full. Pupils are equal, round, and reactive to light and accomodation.  Visual fields are full.  Facial symmetry is present. There is good facial sensation to soft touch bilaterally.Facial strength is normal.  Trapezius and sternocleidomastoid strength is normal. No dysarthria is noted.  The tongue is midline, and the patient has symmetric elevation of the soft palate. No obvious hearing deficits are noted.  Motor:  Muscle bulk is normal.   Tone is normal. Strength is  5 / 5 in all 4 extremities.   Sensory: Sensory testing is intact to pinprick, soft touch and vibration sensation in arms but reduced vibration (to 25% in toes; 75% ankles).  Coordination: Cerebellar testing reveals good finger-nose-finger and heel-to-shin bilaterally.  Gait and station: Station is normal.   Gait is normal. Tandem gait is slightly wide. Romberg is negative.   Reflexes: Deep tendon reflexes are symmetric and normal bilaterally.   Plantar responses are flexor.    DIAGNOSTIC DATA (LABS, IMAGING, TESTING) - I reviewed patient records, labs, notes, testing and imaging myself where available.  Lab Results  Component Value Date   WBC 7.0 02/29/2020   HGB 13.7 02/29/2020   HCT 44.1 02/29/2020   MCV 97.6 02/29/2020   PLT 200 02/29/2020      Component Value Date/Time   NA 141 02/29/2020 0923   K 4.1 02/29/2020 0923   CL 107 02/29/2020 0923   CO2 22 02/29/2020 0923   GLUCOSE 186 (H) 02/29/2020 0923   BUN 10 02/29/2020 3810  CREATININE 0.89 02/29/2020 0923    CALCIUM 9.1 02/29/2020 0923   PROT 7.0 02/29/2020 0923   ALBUMIN 3.6 02/29/2020 0923   AST 17 02/29/2020 0923   ALT 19 02/29/2020 0923   ALKPHOS 96 02/29/2020 0923   BILITOT 0.5 02/29/2020 0923   GFRNONAA >60 02/29/2020 0923   GFRAA >60 02/29/2020 0923   No results found for: CHOL, HDL, LDLCALC, LDLDIRECT, TRIG, CHOLHDL Lab Results  Component Value Date   HGBA1C 7.8 (H) 12/18/2019   No results found for: VITAMINB12 Lab Results  Component Value Date   TSH 1.699 12/18/2019       ASSESSMENT AND PLAN  Seizures ([place])  Diabetic polyneuropathy associated with type 2 diabetes mellitus (HCC)  Numbness - Plan: CANCELED: Vitamin B12, CANCELED: Multiple Myeloma Panel (SPEP&IFE w/QIG), CANCELED: Sjogren's syndrome antibods(ssa + ssb), CANCELED: Sedimentation rate  History of vertebral fracture  Right shoulder pain, unspecified chronicity  Insomnia, unspecified type    In summary, Sergio Dominguez is a 69 year old man who had a prolonged episode of unresponsiveness on 12/17/2019 associated with multiple vertebral fractures and incontinence.  He had a second spell out of sleep that the wife witnessed with generalized tonic-clonic activity followed by unresponsiveness and confusion that resolved over the next 30 minutes.  The spell was all consistent with seizure activity.  He was placed on Keppra 500 mg twice daily.  I increase that dose to 750 mg twice daily.  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  Additionally, he has some numbness in his feet with reduced vibration sensation.  This is likely due to his mild diabetes mellitus.  We discussed that if he has more spells and they are associated with either trauma or sickness, he should go to the emergency room otherwise he could give [place] a call and we will make adjustments in  dose or medication.  He is also advised to call us if he has worsening numbness progressing more rapidly.  Otherwise, he will return to see [place] in about 6 months.  Thank you for asking me to see Sergio Dominguez.  Please let me know if I can be of further assistance or have other patients in the future.  Erricka Falkner A. Felecia Shelling, MD, [facility] 8/0/0123, 93:59 AM Certified in Neurology, Clinical Neurophysiology, Sleep Medicine and Neuroimaging  [facility] [address] [place], [place] [address] [phone]

## 2020-03-30 ENCOUNTER — Encounter: Payer: Self-pay | Admitting: *Deleted

## 2020-03-30 ENCOUNTER — Telehealth: Payer: Self-pay | Admitting: Neurology

## 2020-03-30 NOTE — Telephone Encounter (Signed)
Called wife back (on [place]). Unable to take Gabapentin three times daily that was prescribed. Hard for him to remember to take two pills per day. Advised per Dr. Bonnita Hollow note that he wanted him to take it at night to help with sleep. Would need to verify directions as there seems to be a discrepancy.   Wife stated pt not going to take gabapentin due potential SE. He will discuss later with MD if he wants to try this in the future. He has not picked up Keppra 750mg  po BID yet. He is tolerating 500mg  po BID right now and would like to stay at this dose if ok per MD. His last seizure activity was 02/29/20. He is going out of town and wants to get this straightened out before he leaves. Advised I will discuss with MD and call back.

## 2020-03-30 NOTE — Progress Notes (Signed)
Faxed printed/signed rx keppra 750mg  to pharmacy at [phone]. Received fax confirmation.

## 2020-03-30 NOTE — Telephone Encounter (Signed)
Called pt back. Relayed Dr. Bonnita Hollow message. He verbalized understanding.

## 2020-03-30 NOTE — Telephone Encounter (Signed)
We can leave the Keppra at 500 mg po twice daily but will ned to increase if another seizure occurs.  Ok not to go on night time gabapentin if he chooses (I had written it to see if sleep improved as bad sleep can increase seizures)

## 2020-03-30 NOTE — Telephone Encounter (Signed)
Pt's wife called and LVM stating that she has as question about the medication that was prescribed at his Friday appt. Please advise.

## 2020-03-31 MED ORDER — LEVETIRACETAM 500 MG PO TABS: 500.0000 mg | ORAL_TABLET | Freq: Two times a day (BID) | ORAL | 11 refills | Status: DC

## 2020-03-31 NOTE — Telephone Encounter (Signed)
Pt  Is asking that a refill for the levETIRAcetam (KEPPRA) 500 MG tablet be called in [employer] [phone]

## 2020-03-31 NOTE — Telephone Encounter (Signed)
E-scribed refill as requested. 

## 2020-03-31 NOTE — Addendum Note (Signed)
Addended by: Arther Abbott on: 03/31/2020 11:14 AM   Modules accepted: Orders

## 2020-09-28 ENCOUNTER — Ambulatory Visit (INDEPENDENT_AMBULATORY_CARE_PROVIDER_SITE_OTHER): Payer: Medicare PPO | Admitting: Family Medicine

## 2020-09-28 ENCOUNTER — Telehealth: Payer: Self-pay | Admitting: Family Medicine

## 2020-09-28 ENCOUNTER — Ambulatory Visit: Payer: Medicare PPO | Admitting: Family Medicine

## 2020-09-28 ENCOUNTER — Encounter: Payer: Self-pay | Admitting: Family Medicine

## 2020-09-28 ENCOUNTER — Other Ambulatory Visit: Payer: Self-pay

## 2020-09-28 VITALS — BP 118/78 | HR 88 | Ht 73.0 in | Wt 154.0 lb

## 2020-09-28 DIAGNOSIS — R569 Unspecified convulsions: Secondary | ICD-10-CM

## 2020-09-28 MED ORDER — LEVETIRACETAM ER 500 MG PO TB24: 1000.0000 mg | ORAL_TABLET | Freq: Every day | ORAL | 3 refills | Status: DC

## 2020-09-28 NOTE — Progress Notes (Signed)
PATIENT: Sergio Dominguez DOB: [birth year]  REASON FOR VISIT: follow up HISTORY FROM: patient  Chief Complaint  Patient presents with  . Follow-up    rm 9  . Seizures    Pt said he is having no new sx.     HISTORY OF PRESENT ILLNESS: Today 09/28/20 [name] is a 69 y.o. male here today for follow up for concerns of seizure like activity. He was seen by Dr Epimenio Foot 03/2020. Dr Epimenio Foot suggested to increase levetiracetam from 500 BID to 750mg  BID and add gabapentin 300mg  daily for insomnia. He was hesitant to make changes and continued levetiracetam 500mg  BID only. He did not start gabapentin. MRI brian with [facility] 09/01/2020 was unremarkable.   He reports that he is doing fine. He denies any further episodes. He is no longer smoking marijuana. He is sleeping better. He feels that he is getting better quality sleep. He is eating regular well balanced meals.   HISTORY: (copied from Dr note on 03/27/2020)  I had the pleasure of seeing patient, Sergio Dominguez, at [facility] for neurologic consultation regarding his 2 episodes of unresponsiveness.  He is a 69 year old man who had suspected seizures 12/17/2019.   His wife was living with the daughter during the week and him on weekends (new grandchild).   He recalls planning to make breakfast and his wife told him she called several times that morning.   He was smoking marijuana at the time regularly.   Around 11 am he noted the car alarm was going off and he opened the door to check the car.   He does not recall after that.  His wife found him unresponsive in the second floor and he had urinated on himself.  He was rigid and teeth clenched.   There also were changes downstairs.   He had knocked over a planter and broke a picture frame.  His wife called the ambulance and he regained consciousness and he went to the bathroom (he has no memory of this).    He was taken to the ED.   He has no memory of about 48 hours while in  the hospital.  He had MRIs showing no acute brain lesions and mildly advanced for age changes.    MRI of the spine showed T1, T3-T6, T9, L1, L2, L3, L5 endplate compression fractures and interspinous ligament injury T3-T4 and T6-T7.    Tox screen showed THC only.   He has increased WBC (20.9 mostly neutrophil) and elevated sugars and HgbA1c    On 02/29/2020, he had an episode out of sleep with shaking and unresponsiveness.   No tongue biting or urination.   He woke up in the ambulance mildly confused.   He had smoked a small amount of MJ that week (after not smoking any Jan or Feb).   He was placed on Keppra 500 mg po bid and discharged home.  CT showed no acute.  He sleeps poorly due to waking up in the middle of the night and not falling back asleep.    EEG 12/18/2019 showed slow generalized activity c/w mild to moderate diffuse encephalopathy and excessive beta (likely benzodiazepine)  I personally reviewed the images of the brain, spine.  MRI brain 12/19/2019 showed atrophy and chronic microvascular ischemic changes, a little more than typical.   He has sinusitis.  MRI of the spine showed T1, T3-T6, T9, L1, L2, L3, L5 endplate compression fractures and interspinous ligament injury T3-T4 and  T6-T7.  Due to edema noted on the STIR images, these are acute to subacute   REVIEW OF SYSTEMS: Out of a complete 14 system review of symptoms, the patient complains only of the following symptoms, back pain and all other reviewed systems are negative.  ALLERGIES: Allergies  Allergen Reactions  . Oxycodone Hcl     REACTION: NAUSEA/VOMITNG    HOME MEDICATIONS: Outpatient Medications Prior to Visit  Medication Sig Dispense Refill  . alendronate (FOSAMAX) 70 MG tablet Take 70 mg by mouth once a week. Take with a full glass of water on an empty stomach.    . cetirizine (ZYRTEC) 10 MG tablet Take 10 mg by mouth daily.    . metFORMIN (GLUCOPHAGE) 500 MG tablet Take 1,000 mg by mouth daily.     Marland Kitchen. zinc  gluconate 50 MG tablet Take 50 mg by mouth daily.    . Alpha-Lipoic Acid 200 MG CAPS Take 200 mg by mouth daily.    Marland Kitchen. gabapentin (NEURONTIN) 300 MG capsule Take 1 capsule (300 mg total) by mouth 3 (three) times daily. 30 capsule 11  . levETIRAcetam (KEPPRA) 500 MG tablet Take 1 tablet (500 mg total) by mouth 2 (two) times daily. 60 tablet 11   No facility-administered medications prior to visit.    PAST MEDICAL HISTORY: Past Medical History:  Diagnosis Date  . Diabetes mellitus without complication (HCC)     PAST SURGICAL HISTORY: Past Surgical History:  Procedure Laterality Date  . "hairlip"  1961  . APPENDECTOMY    . ELBOW SURGERY      FAMILY HISTORY: Family History  Problem Relation Age of Onset  . Multiple sclerosis Mother   . Multiple sclerosis Brother   . Lung cancer Brother   . Seizures Neg Hx     SOCIAL HISTORY: Social History   Socioeconomic History  . Marital status: Married    Spouse name: Not on file  . Number of children: 4  . Years of education: associates  . Highest education level: Not on file  Occupational History  . Not on file  Tobacco Use  . Smoking status: Former Smoker    Quit date: 2012    Years since quitting: 9.7  . Smokeless tobacco: Never Used  Vaping Use  . Vaping Use: Never used  Substance and Sexual Activity  . Alcohol use: Not Currently    Comment: none since 2019  . Drug use: Not Currently    Types: Marijuana    Comment: stopped 12/17/2019. has smoked 1 since then.  . Sexual activity: Not on file  Other Topics Concern  . Not on file  Social History Narrative   Lives at home with wife   Right handed   Caffeine: at least 3 cups per day   Social Determinants of Health   Financial Resource Strain:   . Difficulty of Paying Living Expenses: Not on file  Food Insecurity:   . Worried About Programme researcher, broadcasting/film/videounning Out of Food in the Last Year: Not on file  . Ran Out of Food in the Last Year: Not on file  Transportation Needs:   . Lack of  Transportation (Medical): Not on file  . Lack of Transportation (Non-Medical): Not on file  Physical Activity:   . Days of Exercise per Week: Not on file  . Minutes of Exercise per Session: Not on file  Stress:   . Feeling of Stress : Not on file  Social Connections:   . Frequency of Communication with Friends and Family:  Not on file  . Frequency of Social Gatherings with Friends and Family: Not on file  . Attends Religious Services: Not on file  . Active Member of Clubs or Organizations: Not on file  . Attends Banker Meetings: Not on file  . Marital Status: Not on file  Intimate Partner Violence:   . Fear of Current or Ex-Partner: Not on file  . Emotionally Abused: Not on file  . Physically Abused: Not on file  . Sexually Abused: Not on file      PHYSICAL EXAM  Vitals:   09/28/20 1116  BP: 118/78  Pulse: 88  Weight: 154 lb (69.9 kg)  Height: 6\' 1"  (1.854 m)   Body mass index is 20.32 kg/m.  Generalized: Well developed, in no acute distress  Cardiology: normal rate and rhythm, no murmur noted Respiratory: clear to auscultation bilaterally  Neurological examination  Mentation: Alert oriented to time, place, history taking. Follows all commands speech and language fluent Cranial nerve II-XII: Pupils were equal round reactive to light. Extraocular movements were full, visual field were full  Motor: The motor testing reveals 5 over 5 strength of all 4 extremities. Good symmetric motor tone is noted throughout.  Gait and station: Gait is normal.    DIAGNOSTIC DATA (LABS, IMAGING, TESTING) - I reviewed patient records, labs, notes, testing and imaging myself where available.  No flowsheet data found.   Lab Results  Component Value Date   WBC 7.0 02/29/2020   HGB 13.7 02/29/2020   HCT 44.1 02/29/2020   MCV 97.6 02/29/2020   PLT 200 02/29/2020      Component Value Date/Time   NA 141 02/29/2020 0923   K 4.1 02/29/2020 0923   CL 107 02/29/2020 0923    CO2 22 02/29/2020 0923   GLUCOSE 186 (H) 02/29/2020 0923   BUN 10 02/29/2020 0923   CREATININE 0.89 02/29/2020 0923   CALCIUM 9.1 02/29/2020 0923   PROT 7.0 02/29/2020 0923   ALBUMIN 3.6 02/29/2020 0923   AST 17 02/29/2020 0923   ALT 19 02/29/2020 0923   ALKPHOS 96 02/29/2020 0923   BILITOT 0.5 02/29/2020 0923   GFRNONAA >60 02/29/2020 0923   GFRAA >60 02/29/2020 0923   No results found for: CHOL, HDL, LDLCALC, LDLDIRECT, TRIG, CHOLHDL Lab Results  Component Value Date   HGBA1C 7.8 (H) 12/18/2019   No results found for: VITAMINB12 Lab Results  Component Value Date   TSH 1.699 12/18/2019       ASSESSMENT AND PLAN 70 y.o. year old male  has a past medical history of Diabetes mellitus without complication (HCC). here with     ICD-10-CM   1. Seizures (HCC)  R56.9      Mr Carreras is doing very well, today. He has tolerated levetiracetam 500mg  BID. He does admit difficulty remembering to take medication twice daily. We will switch dose to levetiracetam ER 1000mg  daily. Seizure precautions reviewed. Healthy lifestyle habits discussed. He denies previous history of alcohol abuse and was advised that one glass of wine with dinner on occasion should be ok but to use seizure precautions as discussed. He will continue close follow up with PCP. He will return to see Dr Janna Arch in 6 months. He verbalizes understanding and agreement with this plan.   No orders of the defined types were placed in this encounter.    Meds ordered this encounter  Medications  . levETIRAcetam (KEPPRA XR) 500 MG 24 hr tablet    Sig: Take 2 tablets (1,000  mg total) by mouth daily.    Dispense:  180 tablet    Refill:  3    Order Specific Question:   Supervising Provider    Answer:   Anson Fret [id number]      I spent 25 minutes with the patient. 50% of this time was spent counseling and educating patient on plan of care and medications.     Shawnie Dapper, FNP-C 09/28/2020, 12:15 PM [facility] [address] [place], [place] [address] [phone]

## 2020-09-28 NOTE — Patient Instructions (Signed)
According to [place] law, you can not drive unless you are seizure / syncope free for at least 6 months and under physician's care.  Please maintain precautions. Do not participate in activities where a loss of awareness could harm you or someone else. No swimming alone, no tub bathing, no hot tubs, no driving, no operating motorized vehicles (cars, ATVs, motocycles, etc), lawnmowers, power tools or firearms. No standing at heights, such as rooftops, ladders or stairs. Avoid hot objects such as stoves, heaters, open fires. Wear a helmet when riding a bicycle, scooter, skateboard, etc. and avoid areas of traffic. Set your water heater to 120 degrees or less.   We will switch you to an extended release levetiracetam. Take 2 tablets once daily.   Stay well hydrated, eat well balanced meals and get plenty of sleep nightly. An occasional glass of wine should be fine but always utilize seizure precautions.   Follow up in 6 months   Seizure, Adult A seizure is a sudden burst of abnormal electrical activity in the brain. Seizures usually last from 30 seconds to 2 minutes. They can cause many different symptoms. Usually, seizures are not harmful unless they last a long time. What are the causes? Common causes of this condition include:  Fever or infection.  Conditions that affect the brain, such as: ? A brain abnormality that you were born with. ? A brain or head injury. ? Bleeding in the brain. ? A tumor. ? Stroke. ? Brain disorders such as autism or cerebral palsy.  Low blood sugar.  Conditions that are passed from parent to child (are inherited).  Problems with substances, such as: ? Having a reaction to a drug or a medicine. ? Suddenly stopping the use of a substance (withdrawal). In some cases, the cause may not be known. A person who has repeated seizures over time without a clear cause has a condition called epilepsy. What increases the risk? You are more likely to get this condition if  you have:  A family history of epilepsy.  Had a seizure in the past.  A brain disorder.  A history of head injury, lack of oxygen at birth, or strokes. What are the signs or symptoms? There are many types of seizures. The symptoms vary depending on the type of seizure you have. Examples of symptoms during a seizure include:  Shaking (convulsions).  Stiffness in the body.  Passing out (losing consciousness).  Head nodding.  Staring.  Not responding to sound or touch.  Loss of bladder control and bowel control. Some people have symptoms right before and right after a seizure happens. Symptoms before a seizure may include:  Fear.  Worry (anxiety).  Feeling like you may vomit (nauseous).  Feeling like the room is spinning (vertigo).  Feeling like you saw or heard something before (dj vu).  Odd tastes or smells.  Changes in how you see. You may see flashing lights or spots. Symptoms after a seizure happens can include:  Confusion.  Sleepiness.  Headache.  Weakness on one side of the body. How is this treated? Most seizures will stop on their own in under 5 minutes. In these cases, no treatment is needed. Seizures that last longer than 5 minutes will usually need treatment. Treatment can include:  Medicines given through an IV tube.  Avoiding things that are known to cause your seizures. These can include medicines that you take for another condition.  Medicines to treat epilepsy.  Surgery to stop the seizures. This may  be needed if medicines do not help. Follow these instructions at home: Medicines  Take over-the-counter and prescription medicines only as told by your doctor.  Do not eat or drink anything that may keep your medicine from working, such as alcohol. Activity  Do not do any activities that would be dangerous if you had another seizure, like driving or swimming. Wait until your doctor says it is safe for you to do them.  If you live in  the [place] ask your local DMV (department of motor vehicles) when you can drive.  Get plenty of rest. Teaching others Teach friends and family what to do when you have a seizure. They should:  Lay you on the ground.  Protect your head and body.  Loosen any tight clothing around your neck.  Turn you on your side.  Not hold you down.  Not put anything into your mouth.  Know whether or not you need emergency care.  Stay with you until you are better.  General instructions  Contact your doctor each time you have a seizure.  Avoid anything that gives you seizures.  Keep a seizure diary. Write down: ? What you think caused each seizure. ? What you remember about each seizure.  Keep all follow-up visits as told by your doctor. This is important. Contact a doctor if:  You have another seizure.  You have seizures more often.  There is any change in what happens during your seizures.  You keep having seizures with treatment.  You have symptoms of being sick or having an infection. Get help right away if:  You have a seizure that: ? Lasts longer than 5 minutes. ? Is different than seizures you had before. ? Makes it harder to breathe. ? Happens after you hurt your head.  You have any of these symptoms after a seizure: ? Not being able to speak. ? Not being able to use a part of your body. ? Confusion. ? A bad headache.  You have two or more seizures in a row.  You do not wake up right after a seizure.  You get hurt during a seizure. These symptoms may be an emergency. Do not wait to see if the symptoms will go away. Get medical help right away. Call your local emergency services (911 in the [place] Do not drive yourself to the hospital. Summary  Seizures usually last from 30 seconds to 2 minutes. Usually, they are not harmful unless they last a long time.  Do not eat or drink anything that may keep your medicine from working, such as alcohol.  Teach friends  and family what to do when you have a seizure.  Contact your doctor each time you have a seizure. This information is not intended to replace advice given to you by your health care provider. Make sure you discuss any questions you have with your health care provider. Document Revised: 03/01/2019 Document Reviewed: 03/01/2019 Elsevier Patient Education  2020 Elsevier Inc.   Levetiracetam extended-release tablets What is this medicine? LEVETIRACETAM (lee ve tye RA se tam) is an antiepileptic drug. It is used with other medicines to treat certain types of seizures. This medicine may be used for other purposes; ask your health care provider or pharmacist if you have questions. COMMON BRAND NAME(S): Keppra XR, Roweepra What should I tell my health care provider before I take this medicine? They need to know if you have any of these conditions:  kidney disease  suicidal thoughts, plans, or attempt;  a previous suicide attempt by you or a family member  an unusual or allergic reaction to levetiracetam, other medicines, foods, dyes, or preservatives  pregnant or trying to get pregnant  breast-feeding How should I use this medicine? Take this medicine by mouth with a glass of water. Follow the directions on the prescription label. Do not cut, crush or chew this medicine. You may take this medicine with or without food. Take your doses at regular intervals. Do not take your medicine more often than directed. Do not stop taking this medicine or any of your seizure medicines unless instructed by your doctor or health care professional. Stopping your medicine suddenly can increase your seizures or their severity. A special MedGuide will be given to you by the pharmacist with each prescription and refill. Be sure to read this information carefully each time. Contact your pediatrician or health care professional regarding the use of this medication in children. While this drug may be prescribed for  children as young as 58 years of age for selected conditions, precautions do apply. Overdosage: If you think you have taken too much of this medicine contact a poison control center or emergency room at once. NOTE: This medicine is only for you. Do not share this medicine with others. What if I miss a dose? If you miss a dose and it has only been a few hours, take it as soon as you can. If it is almost time for your next dose, take only that dose. Do not take double or extra doses. What may interact with this medicine? This medicine may interact with the following medications:  carbamazepine  colesevelam  probenecid  sevelamer This list may not describe all possible interactions. Give your health care provider a list of all the medicines, herbs, non-prescription drugs, or dietary supplements you use. Also tell them if you smoke, drink alcohol, or use illegal drugs. Some items may interact with your medicine. What should I watch for while using this medicine? Visit your doctor or health care provider for a regular check on your progress. Wear a medical identification bracelet or chain to say you have epilepsy, and carry a card that lists all your medications. This medicine may cause serious skin reactions. They can happen weeks to months after starting the medicine. Contact your health care provider right away if you notice fevers or flu-like symptoms with a rash. The rash may be red or purple and then turn into blisters or peeling of the skin. Or, you might notice a red rash with swelling of the face, lips or lymph nodes in your neck or under your arms. It is important to take this medicine exactly as instructed by your health care provider. When first starting treatment, your dose may need to be adjusted. It may take weeks or months before your dose is stable. You should contact your doctor or health care provider if your seizures get worse or if you have any new types of seizures. You may get  drowsy or dizzy. Do not drive, use machinery, or do anything that needs mental alertness until you know how this medicine affects you. Do not stand or sit up quickly, especially if you are an older patient. This reduces the risk of dizzy or fainting spells. Alcohol may interfere with the effect of this medicine. Avoid alcoholic drinks. The use of this medicine may increase the chance of suicidal thoughts or actions. Pay special attention to how you are responding while on this medicine. Any worsening  of mood, or thoughts of suicide or dying should be reported to your health care provider right away. The tablet shell for some brands of this medicine does not dissolve. This is normal. The tablet shell may appear in the stool. This is not cause for concern. Women who become pregnant while using this medicine may enroll in the [place] Antiepileptic Drug Pregnancy Registry by calling ([phone])[phone]. This registry collects information about the safety of antiepileptic drug use during pregnancy. What side effects may I notice from receiving this medicine? Side effects that you should report to your doctor or health care professional as soon as possible:  allergic reactions like skin rash, itching or hives, swelling of the face, lips, or tongue  breathing problems  changes in emotions or moods  dark urine  general ill feeling or flu-like symptoms  problems with balance, talking, walking  rash, fever, and swollen lymph nodes  redness, blistering, peeling or loosening of the skin, including inside the mouth  suicidal thoughts or actions  unusually weak or tired  yellowing of the eyes or skin Side effects that usually do not require medical attention (report to your doctor or health care professional if they continue or are bothersome):  diarrhea  dizzy, drowsy  headache  loss of appetite This list may not describe all possible side effects. Call your doctor for medical advice about  side effects. You may report side effects to FDA at 1-800-FDA-1088. Where should I keep my medicine? Keep out of reach of children. Store at room temperature between 15 and 30 degrees C (59 and 86 degrees F). Throw away any unused medicine after the expiration date. NOTE: This sheet is a summary. It may not cover all possible information. If you have questions about this medicine, talk to your doctor, pharmacist, or health care provider.  2020 Elsevier/Gold Standard (2019-03-15 15:14:16)

## 2020-09-28 NOTE — Progress Notes (Signed)
I have read the note, and I agree with the clinical assessment and plan.  Dayon Witt A. Ayron Fillinger, MD, PhD, FAAN Certified in Neurology, Clinical Neurophysiology, Sleep Medicine, Pain Medicine and Neuroimaging  [facility] [address], [address] [place], [place] [address] [phone]  

## 2020-09-30 ENCOUNTER — Ambulatory Visit: Payer: Medicare PPO | Admitting: Family Medicine

## 2020-10-03 NOTE — Telephone Encounter (Signed)
I compared the MRI from 09/01/2020 ([facility]) to the MRI from December 2020 [facility]).  Both MRIs show advanced for age chronic microvascular ischemic changes in the hemispheres and pons (other etiologies are less likely).  There are no acute findings on the current MRI and no changes compared to the December 2020 MRI.

## 2020-10-05 NOTE — Telephone Encounter (Signed)
I called pt and relayed the results of MRI per Dr. Ledon Snare note.   He verbalized understanding. Appreciated call.

## 2020-10-05 NOTE — Telephone Encounter (Signed)
Andrey Campanile, can you update patient per Dr Bonnita Hollow note below? No changes compared to MRI 11/2019. TY!

## 2020-10-09 ENCOUNTER — Telehealth: Payer: Self-pay | Admitting: Family Medicine

## 2020-10-09 NOTE — Telephone Encounter (Signed)
Pt's wife, Dhiren Azimi called, Having difficulty with new medication levETIRAcetam (KEPPRA XR) 500 MG 24 hr tablet. Having dizziness, waking in the middle of the night. Would like a call from the nurse.

## 2020-10-12 NOTE — Telephone Encounter (Signed)
I called pt and he stated that he is back on Keppra 500mg  po bid, as had 2 episodes of dizziness, light headedness once on Last Sunday at a resturant and another time at home on last Thursday.  Since back on keppra 500mg  po bid is not having any issues.  Spoke to pt and wife about MRI results.  Wanted copy of Dr. Tuesday note.  Mailed to him

## 2020-10-12 NOTE — Telephone Encounter (Signed)
Ok to continue levetiracetam IR 500mg  BID. Remind him to take twice daily. TY!

## 2020-10-13 MED ORDER — LEVETIRACETAM ER 500 MG PO TB24: 500.0000 mg | ORAL_TABLET | Freq: Two times a day (BID) | ORAL | 3 refills | Status: DC

## 2020-10-13 MED ORDER — LEVETIRACETAM ER 500 MG PO TB24: 500.0000 mg | ORAL_TABLET | Freq: Two times a day (BID) | ORAL | 2 refills | Status: DC

## 2020-10-13 MED ORDER — LEVETIRACETAM 500 MG PO TABS: 500.0000 mg | ORAL_TABLET | Freq: Two times a day (BID) | ORAL | 3 refills | Status: DC

## 2020-10-13 NOTE — Addendum Note (Signed)
Addended by: Shawnie Dapper L on: 10/13/2020 12:21 PM   Modules accepted: Orders

## 2020-10-13 NOTE — Telephone Encounter (Signed)
I called [facility] and LMVM for pt that keppra XR cancelled and 500mg  IR levetiracetam tablets 1 po bid is what pt needs to be on.  They are to call back fi questions.

## 2020-10-13 NOTE — Addendum Note (Signed)
Addended by: Guy Begin on: 10/13/2020 10:23 AM   Modules accepted: Orders

## 2020-12-15 ENCOUNTER — Other Ambulatory Visit: Payer: Medicare PPO

## 2020-12-15 DIAGNOSIS — Z20822 Contact with and (suspected) exposure to covid-19: Secondary | ICD-10-CM

## 2020-12-18 LAB — NOVEL CORONAVIRUS, NAA: SARS-CoV-2, NAA: NOT DETECTED

## 2021-03-29 ENCOUNTER — Ambulatory Visit: Payer: Medicare PPO | Admitting: Neurology

## 2021-03-29 ENCOUNTER — Encounter: Payer: Self-pay | Admitting: Neurology

## 2021-03-29 VITALS — BP 107/69 | HR 92 | Ht 73.0 in | Wt 162.0 lb

## 2021-03-29 DIAGNOSIS — Z8781 Personal history of (healed) traumatic fracture: Secondary | ICD-10-CM

## 2021-03-29 DIAGNOSIS — R200 Anesthesia of skin: Secondary | ICD-10-CM

## 2021-03-29 DIAGNOSIS — E1142 Type 2 diabetes mellitus with diabetic polyneuropathy: Secondary | ICD-10-CM | POA: Diagnosis not present

## 2021-03-29 DIAGNOSIS — G4700 Insomnia, unspecified: Secondary | ICD-10-CM

## 2021-03-29 DIAGNOSIS — R569 Unspecified convulsions: Secondary | ICD-10-CM

## 2021-03-29 NOTE — Progress Notes (Signed)
[facility]  PATIENT: Sergio Dominguez DOB: 1951-11-21  REFERRING DOCTOR OR PCP: Pricilla Loveless (ED); Tracey Harries (PCP) SOURCE: Patient, notes from 2 emergency room visits and December 2020 hospital admission, extensive imaging and laboratory reports, MRI and CT images personally reviewed  _________________________________   HISTORICAL  CHIEF COMPLAINT:  Chief Complaint  Patient presents with  . Follow-up    RM 13, alone. Last seen 09/28/20 by AL,NP. On Keppra for seizures.     HISTORY OF PRESENT ILLNESS:  Sergio Dominguez, is a 70 yo man with h/o 2 seizures.  Update 03/29/2021 He reports being seizure free.    He is on Keppra 500 mg po bid but in actuality, he takes at bedtime only.  He had trouble tolerating an extended release.      Since supplementing the Vit D he feels he is able to do more.  He can now bend all the way over to tie shoews or pick an item up.   He has less back pain and stiffness.     He has numbness in toes but no pain.    No problems with gait or balance.  Seizure History He had suspected seizures 12/17/2019.   His wife was living with the daughter during the week and him on weekends (new grandchild).   He recalls planning to make breakfast and his wife told him she called several times that morning.   He was smoking marijuana at the time regularly.   Around 11 am he noted the car alarm was going off and he opened the door to check the car.   He does not recall after that.  His wife found him unresponsive in the second floor and he had urinated on himself.  He was rigid and teeth clenched.   There also were changes downstairs.   He had knocked over a planter and broke a picture frame.  His wife called the ambulance and he regained consciousness and he went to the bathroom (he has no memory of this).    He was taken to the ED.   He has no memory of about 48 hours while in the hospital.  He had MRIs showing no acute brain lesions and mildly  advanced for age changes.    MRI of the spine showed T1, T3-T6, T9, L1, L2, L3, L5 endplate compression fractures and interspinous ligament injury T3-T4 and T6-T7.    Tox screen showed THC only.   He has increased WBC (20.9 mostly neutrophil) and elevated sugars and HgbA1c    On 02/29/2020, he had an episode out of sleep with shaking and unresponsiveness.   No tongue biting or urination.   He woke up in the ambulance mildly confused.   He had smoked a small amount of MJ that week (after not smoking any Jan or Feb).   He was placed on Keppra 500 mg po bid and discharged home.  CT showed no acute.   At the time insomnia was an issue   DATA EEG 12/18/2019 showed slow generalized activity c/w mild to moderate diffuse encephalopathy and excessive beta (likely benzodiazepine)  MRI brain 12/19/2019 showed atrophy and chronic microvascular ischemic changes, a little more than typical.   He has sinusitis.    MRI of the spine showed T1, T3-T6, T9, L1, L2, L3, L5 endplate compression fractures and interspinous ligament injury T3-T4 and T6-T7.  Due to edema noted on the STIR images, these are acute to subacute  REVIEW OF SYSTEMS: Constitutional: No  fevers, chills, sweats, or change in appetite.  He has insomnia. Eyes: No visual changes, double vision, eye pain Ear, nose and throat: No hearing loss, ear pain, nasal congestion, sore throat Cardiovascular: No chest pain, palpitations Respiratory: No shortness of breath at rest or with exertion.   No wheezes GastrointestinaI: No nausea, vomiting, diarrhea, abdominal pain, fecal incontinence Genitourinary: No dysuria, urinary retention or frequency.  No nocturia. Musculoskeletal: still has pain in back with stiffness.   Right shoulder pain Integumentary: No rash, pruritus, skin lesions Neurological: as above Psychiatric: No depression at this time.  No anxiety Endocrine: He has diabetes.  He had not been treated until recently.  No palpitations, diaphoresis,  change in appetite, change in weigh or increased thirst Hematologic/Lymphatic: No anemia, purpura, petechiae. Allergic/Immunologic: No itchy/runny eyes, nasal congestion, recent allergic reactions, rashes  ALLERGIES: Allergies  Allergen Reactions  . Oxycodone Hcl     REACTION: NAUSEA/VOMITNG    HOME MEDICATIONS:  Current Outpatient Medications:  .  ALPHA LIPOIC ACID PO, Take by mouth., Disp: , Rfl:  .  Calcium-Vitamin D ([employer] CALCIUM-600/VIT D PO), Take by mouth., Disp: , Rfl:  .  levETIRAcetam (KEPPRA) 500 MG tablet, Take 1 tablet (500 mg total) by mouth 2 (two) times daily., Disp: 180 tablet, Rfl: 3 .  metFORMIN (GLUCOPHAGE) 500 MG tablet, Take 1,000 mg by mouth daily. , Disp: , Rfl:  .  zinc gluconate 50 MG tablet, Take 50 mg by mouth daily., Disp: , Rfl:   PAST MEDICAL HISTORY: Past Medical History:  Diagnosis Date  . Diabetes mellitus without complication (HCC)     PAST SURGICAL HISTORY: Past Surgical History:  Procedure Laterality Date  . "hairlip"  1961  . APPENDECTOMY    . ELBOW SURGERY      FAMILY HISTORY: Family History  Problem Relation Age of Onset  . Multiple sclerosis Mother   . Multiple sclerosis Brother   . Lung cancer Brother   . Seizures Neg Hx     SOCIAL HISTORY:  Social History   Socioeconomic History  . Marital status: Married    Spouse name: Not on file  . Number of children: 4  . Years of education: associates  . Highest education level: Not on file  Occupational History  . Not on file  Tobacco Use  . Smoking status: Former Smoker    Quit date: 2012    Years since quitting: 10.2  . Smokeless tobacco: Never Used  Vaping Use  . Vaping Use: Never used  Substance and Sexual Activity  . Alcohol use: Not Currently    Comment: none since 2019  . Drug use: Not Currently    Types: Marijuana    Comment: stopped 12/17/2019. has smoked 1 since then.  . Sexual activity: Not on file  Other Topics Concern  . Not on file  Social History  Narrative   Lives at home with wife   Right handed   Caffeine: at least 3 cups per day   Social Determinants of Health   Financial Resource Strain: Not on file  Food Insecurity: Not on file  Transportation Needs: Not on file  Physical Activity: Not on file  Stress: Not on file  Social Connections: Not on file  Intimate Partner Violence: Not on file     PHYSICAL EXAM  Vitals:   03/29/21 1110  BP: 107/69  Pulse: 92  Weight: 162 lb (73.5 kg)  Height: 6\' 1"  (1.854 m)    Body mass index is 21.37 kg/m.  General: The patient is well-developed and well-nourished and in no acute distress  HEENT:  Head is [place]/AT.  Sclera are anicteric.     Skin: Extremities are without rash or  edema.  Neurologic Exam  Mental status: The patient is alert and oriented x 3 at the time of the examination. The patient has apparent normal recent and remote memory, with an apparently normal attention span and concentration ability.   Speech is normal.  Cranial nerves: Extraocular movements are full.Facial strength and sensation were normal.. No obvious hearing deficits are noted.  Motor:  Muscle bulk is normal.   Tone is normal. Strength is  5 / 5 in all 4 extremities.   Sensory: Sensory testing is intact to pinprick, soft touch and vibration sensation in arms but reduced vibration (to 25% in toes; 75% ankles).  Coordination: Cerebellar testing reveals good finger-nose-finger and heel-to-shin bilaterally.  Gait and station: Station is normal.   Gait is normal. Tandem gait is slightly wide. Romberg is negative.   Reflexes: Deep tendon reflexes are symmetric and normal bilaterally.    Marland Kitchen    DIAGNOSTIC DATA (LABS, IMAGING, TESTING) - I reviewed patient records, labs, notes, testing and imaging myself where available.  Lab Results  Component Value Date   WBC 7.0 02/29/2020   HGB 13.7 02/29/2020   HCT 44.1 02/29/2020   MCV 97.6 02/29/2020   PLT 200 02/29/2020      Component Value  Date/Time   NA 141 02/29/2020 0923   K 4.1 02/29/2020 0923   CL 107 02/29/2020 0923   CO2 22 02/29/2020 0923   GLUCOSE 186 (H) 02/29/2020 0923   BUN 10 02/29/2020 0923   CREATININE 0.89 02/29/2020 0923   CALCIUM 9.1 02/29/2020 0923   PROT 7.0 02/29/2020 0923   ALBUMIN 3.6 02/29/2020 0923   AST 17 02/29/2020 0923   ALT 19 02/29/2020 0923   ALKPHOS 96 02/29/2020 0923   BILITOT 0.5 02/29/2020 0923   GFRNONAA >60 02/29/2020 0923   GFRAA >60 02/29/2020 0923   No results found for: CHOL, HDL, LDLCALC, LDLDIRECT, TRIG, CHOLHDL Lab Results  Component Value Date   HGBA1C 7.8 (H) 12/18/2019   No results found for: VITAMINB12 Lab Results  Component Value Date   TSH 1.699 12/18/2019       ASSESSMENT AND PLAN  Seizures (HCC)  Diabetic polyneuropathy associated with type 2 diabetes mellitus (HCC)  History of vertebral fracture  Insomnia, unspecified type  Numbness   1.  Keppra 250 mg qAM and  500 mgnightly.   2.   Try to get 7 hours of sleep or a little more each night as sleep deprivatrion can be a seizure risk factor.   3.   Keep sugars in good control.    (HgbA1c was 7-8 by his report).  He si trying to eat better and has cut out sweets and reduced grains.    We discussed his polyneuropathy. 4.    Can f/u in one year or sooner if problems  Dominica Kent A. Epimenio Foot, MD, PhD, [place] 03/29/2021, 12:09 PM Certified in Neurology, Clinical Neurophysiology, Sleep Medicine and Neuroimaging  [facility] [address], [address] [place], [place] [address] [phone]

## 2021-05-29 IMAGING — DX DG CHEST 1V PORT
1 series · 1 of 1 positions shown · non-contrast
Comparison: Portable exam 1504 hours without priors for comparison

CLINICAL DATA: Mental status changes

EXAM:
PORTABLE CHEST 1 VIEW

[chest ap]
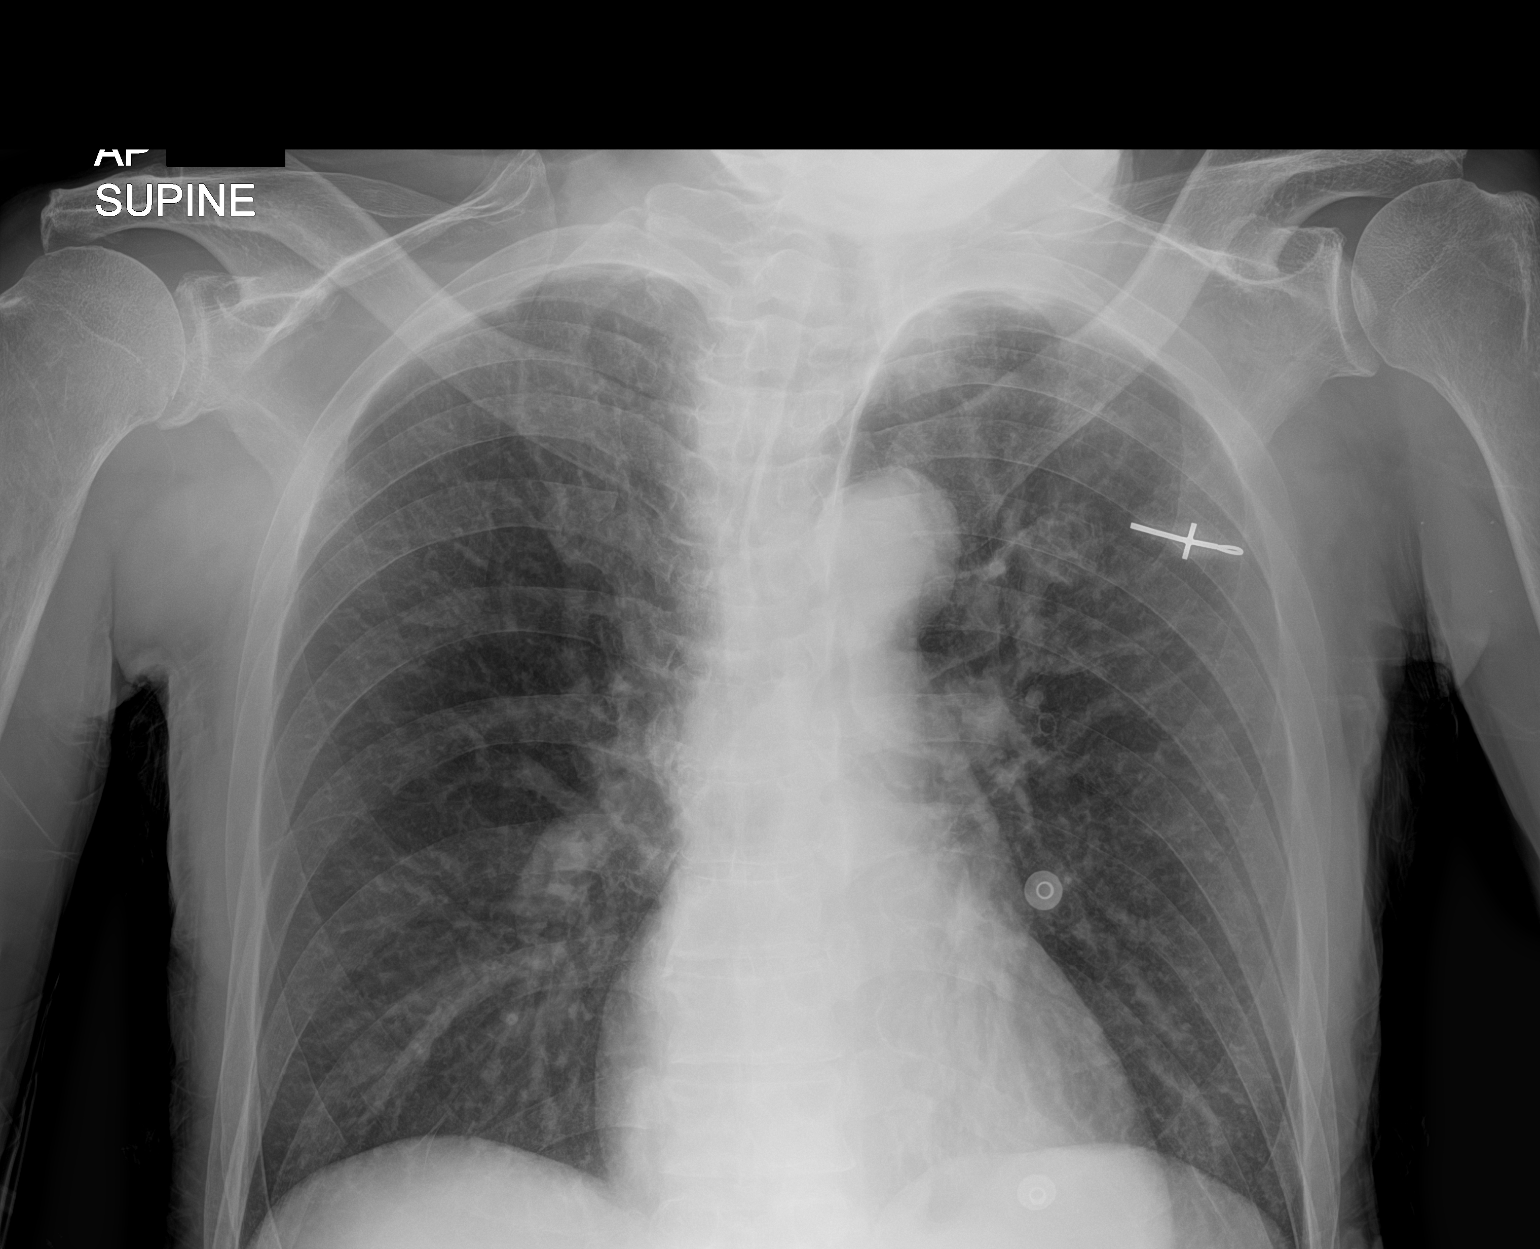

[1 of 1 positions shown; findings below may reference images not displayed]

FINDINGS: Jewelry artifact projects over LEFT upper lobe.

Normal heart size, mediastinal contours, and pulmonary vascularity.

Peribronchial thickening with atelectasis versus infiltrate at
medial LEFT lower lobe.

Prominent first costochondral junctions bilaterally.

Remaining lungs clear.

No pleural effusion or pneumothorax.

Bones demineralized.
IMPRESSION: Bronchitic changes with atelectasis versus infiltrate in
retrocardiac LEFT lower lobe.

## 2021-05-29 IMAGING — CT CT CERVICAL SPINE W/O CM
3 series · 14 of 34 positions shown, 17 images · non-contrast
Comparison: None.

CLINICAL DATA: Altered mental status. Episode of unresponsiveness
today.

EXAM:
CT HEAD WITHOUT CONTRAST
CT CERVICAL SPINE WITHOUT CONTRAST
TECHNIQUE: Multidetector CT imaging of the head and cervical spine was
performed following the standard protocol without intravenous
contrast. Multiplanar CT image reconstructions of the cervical spine
were also generated.

[Series 6: c spine soft · axial · 0.33mm/px · z∈[+1161,+1323]mm · 6 of 107 slices shown, 8 images]
[im 17/107  soft-tissue]
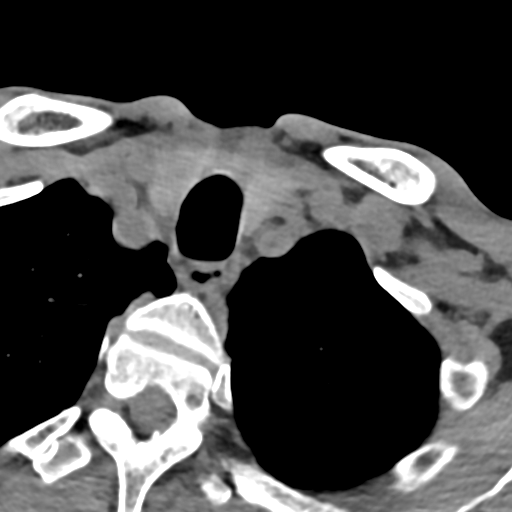
[im 17/107  bone]
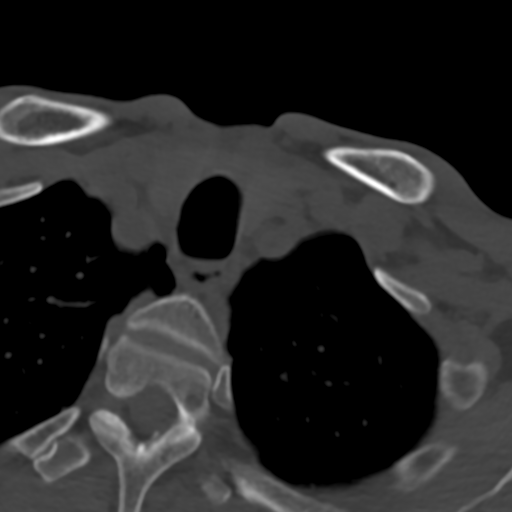
[im 33/107  bone]
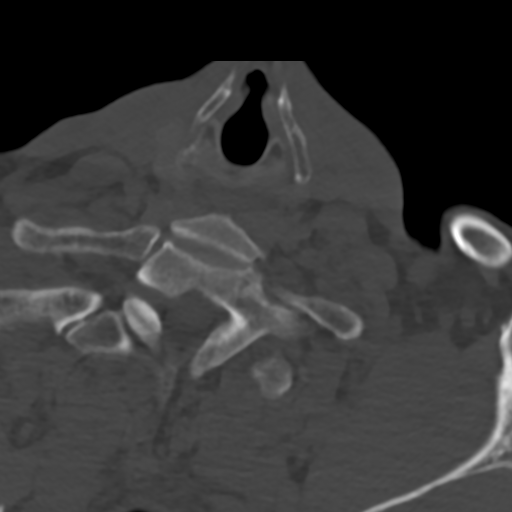
[im 49/107  bone]
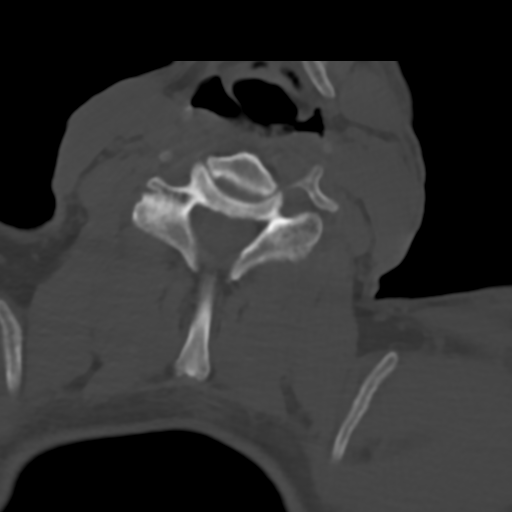
[im 66/107  bone]
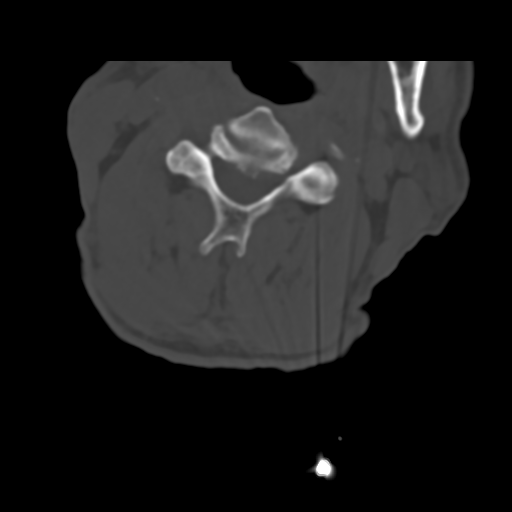
[im 82/107  soft-tissue]
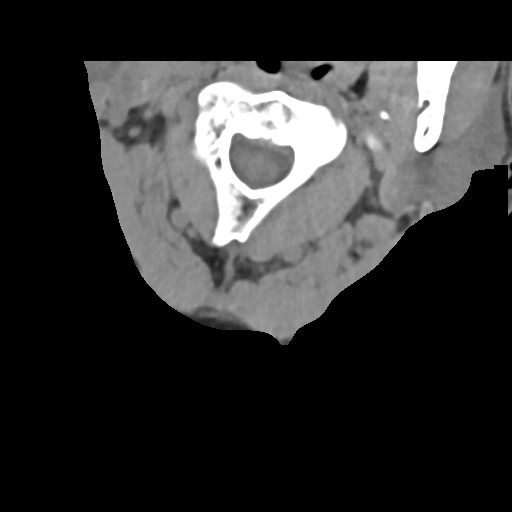
[im 82/107  bone]
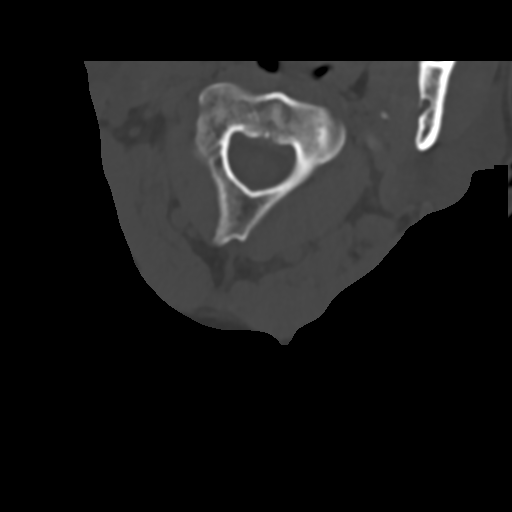
[im 98/107  bone]
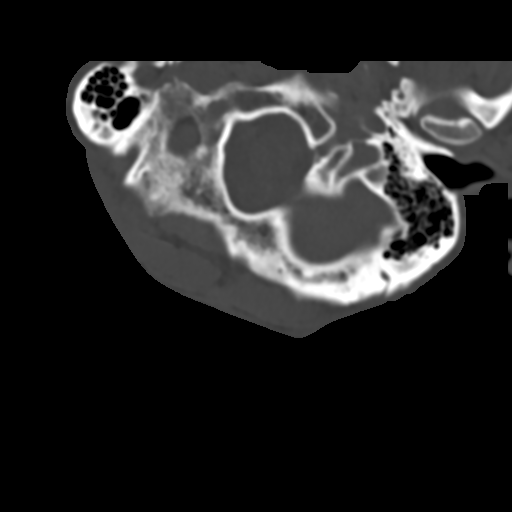

[Series 9: sag bone · sagittal · 0.48mm/px · 5 of 68 slices shown, 6 images]
[im 23/68  bone]
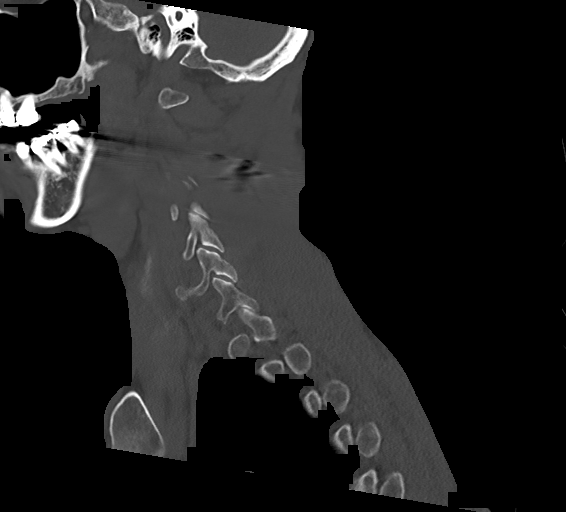
[im 28/68  bone]
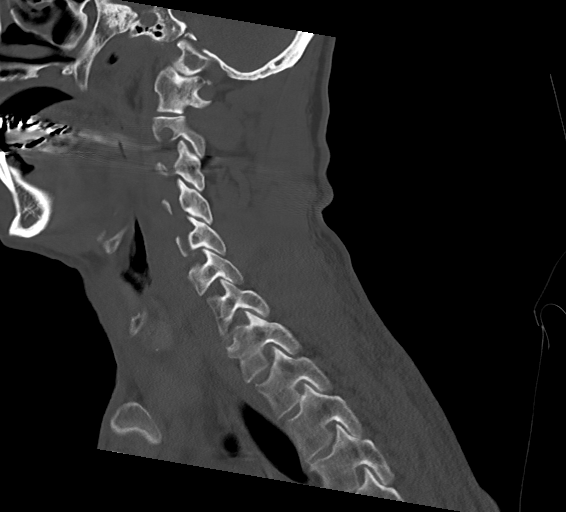
[im 34/68  soft-tissue]
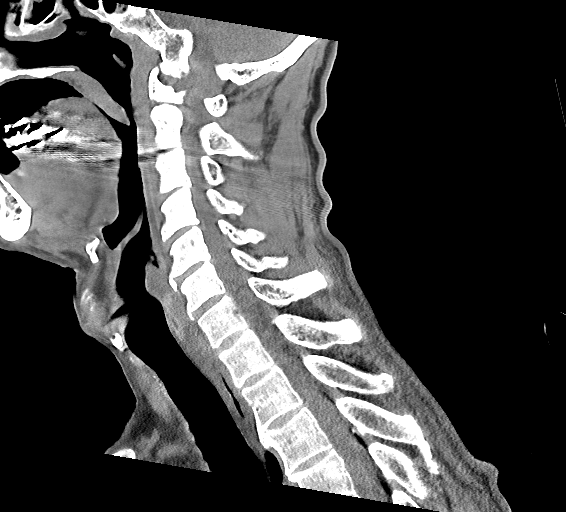
[im 34/68  bone]
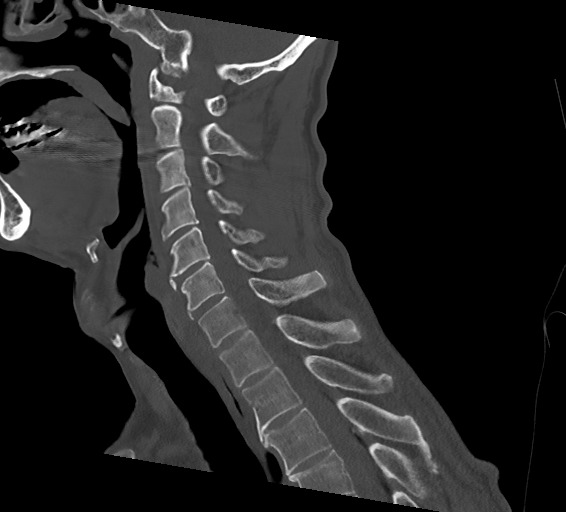
[im 40/68  bone]
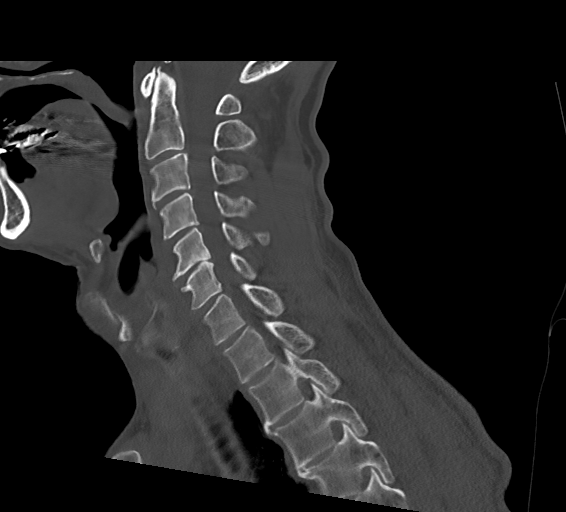
[im 45/68  bone]
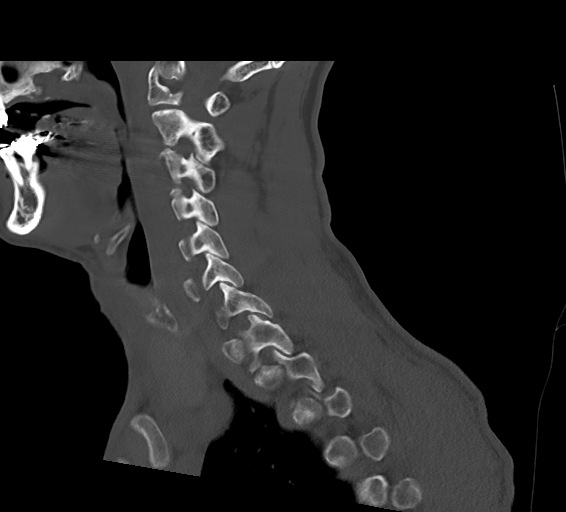

[Series 10: cor bone · coronal · 0.32mm/px · 3 of 68 slices shown]
[im 14/68  bone]
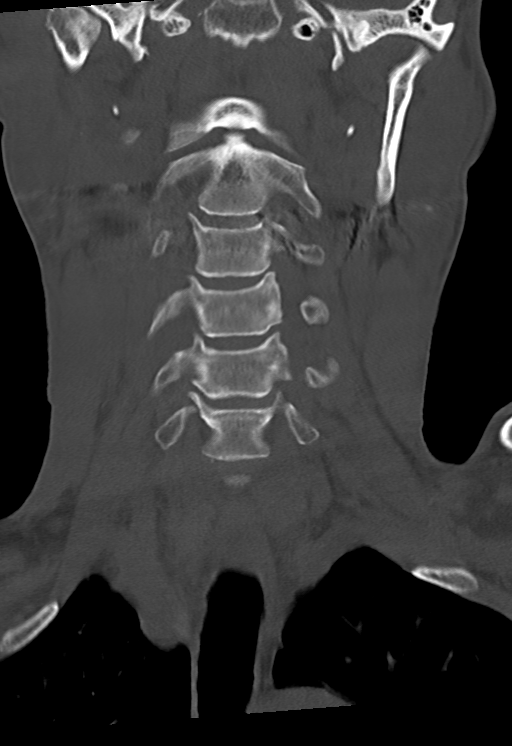
[im 27/68  bone]
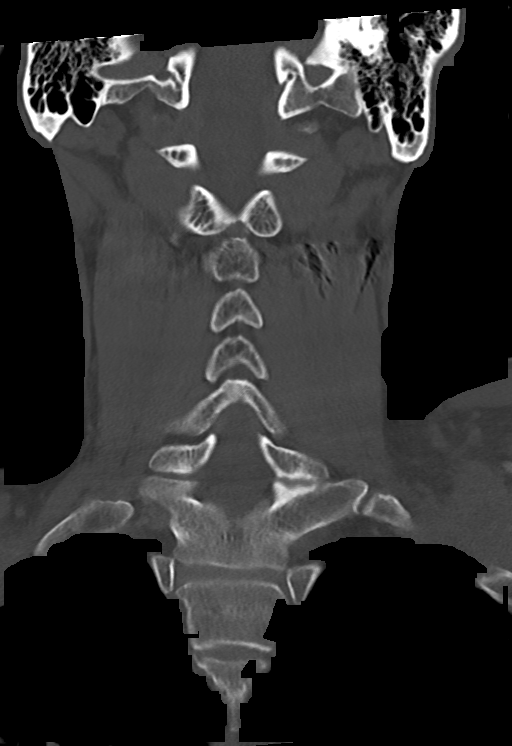
[im 41/68  bone]
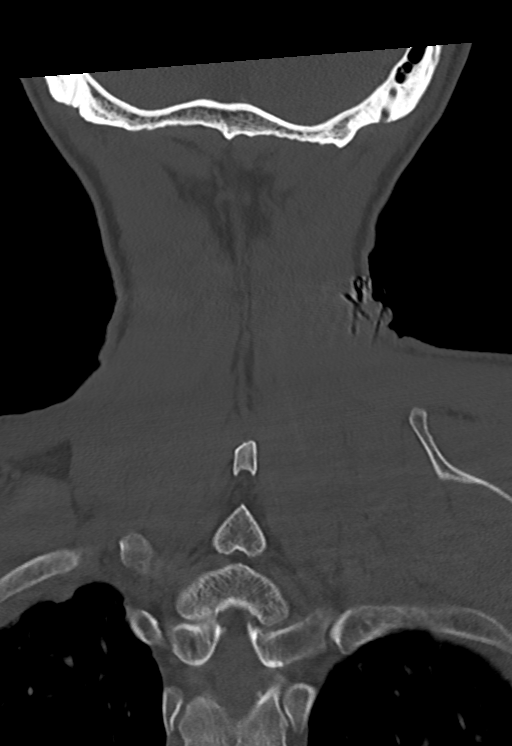

[14 of 34 positions shown; findings below may reference images not displayed]

FINDINGS: CT HEAD FINDINGS

Brain: No evidence of acute infarction, hemorrhage, hydrocephalus,
extra-axial collection or mass lesion/mass effect.

Vascular: No hyperdense vessel or unexpected calcification.

Skull: Intact.  No focal lesion.

Sinuses/Orbits: Scattered ethmoid air cell disease is seen. Small
mucous retention cyst or polyp right maxillary sinus noted.

Other: None.

CT CERVICAL SPINE FINDINGS

Alignment: Maintained.

Skull base and vertebrae: No acute fracture. No primary bone lesion
or focal pathologic process.

Soft tissues and spinal canal: No prevertebral fluid or swelling. No
visible canal hematoma.

Disc levels: Very mild loss of disc space height and endplate
spurring are seen at C3-4 and C5-6.

Upper chest: Emphysematous disease is noted.  Lung apices are clear.

Other: None.
IMPRESSION: No acute abnormality head or cervical spine.

Mild cervical degenerative change.

Emphysema.

## 2021-05-29 IMAGING — CT CT HEAD W/O CM
4 of 5 series · 15 of 47 positions shown, 16 images · non-contrast
Comparison: None.

CLINICAL DATA: Altered mental status. Episode of unresponsiveness
today.

EXAM:
CT HEAD WITHOUT CONTRAST
CT CERVICAL SPINE WITHOUT CONTRAST
TECHNIQUE: Multidetector CT imaging of the head and cervical spine was
performed following the standard protocol without intravenous
contrast. Multiplanar CT image reconstructions of the cervical spine
were also generated.

[Series 3: head wo · axial · 0.46mm/px · z∈[+1320,+1430]mm · 4 of 38 slices shown, 5 images]
[im 8/38  brain]
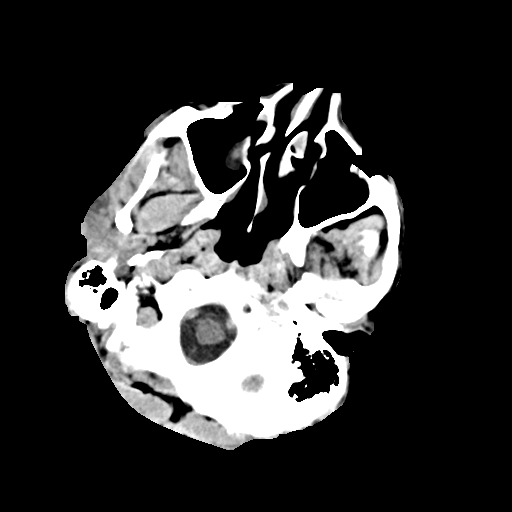
[im 8/38  bone]
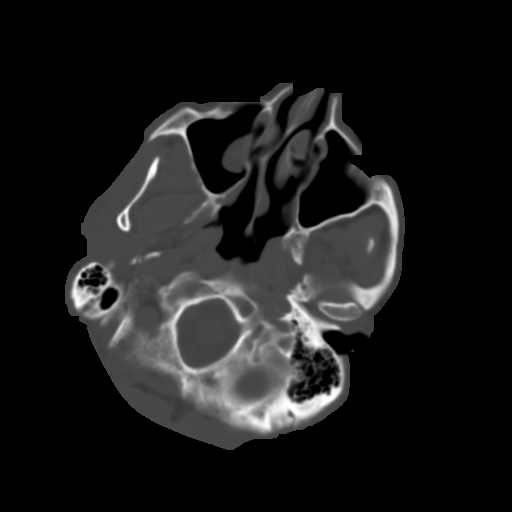
[im 15/38  brain]
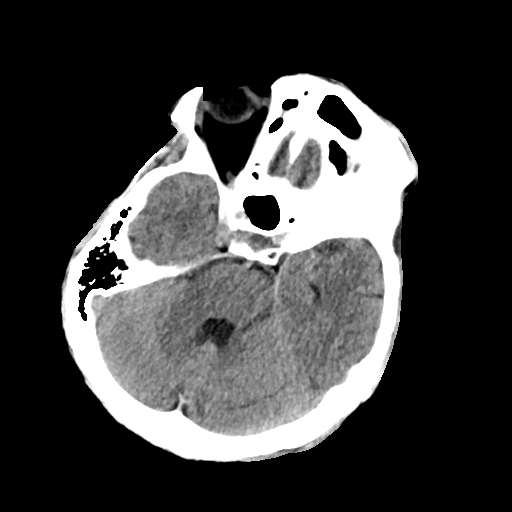
[im 23/38  brain]
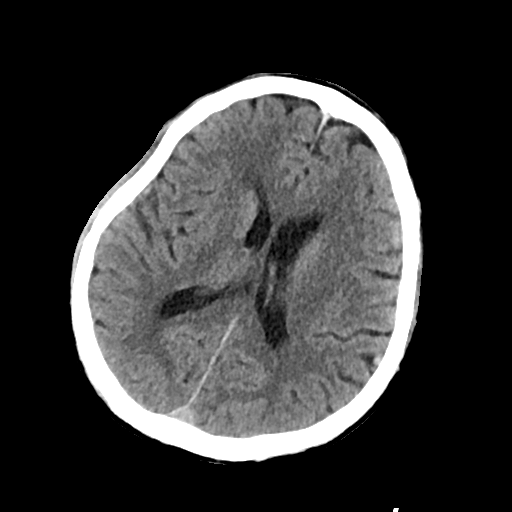
[im 30/38  brain]
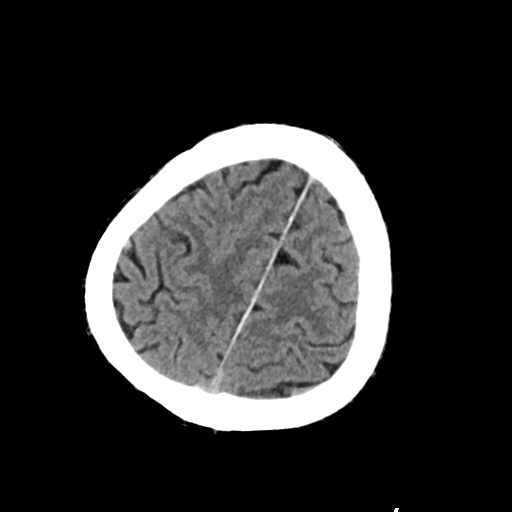

[Series 5: cor soft · coronal · 0.38mm/px · 3 of 74 slices shown]
[im 25/74  brain]
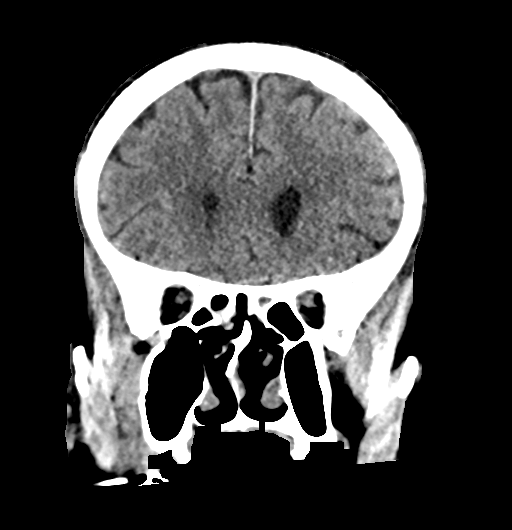
[im 33/74  brain]
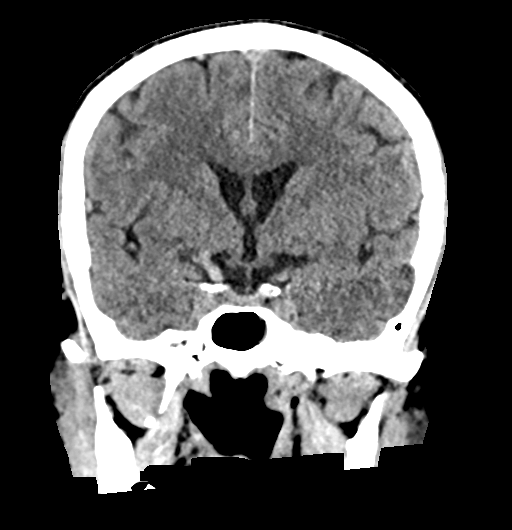
[im 41/74  brain]
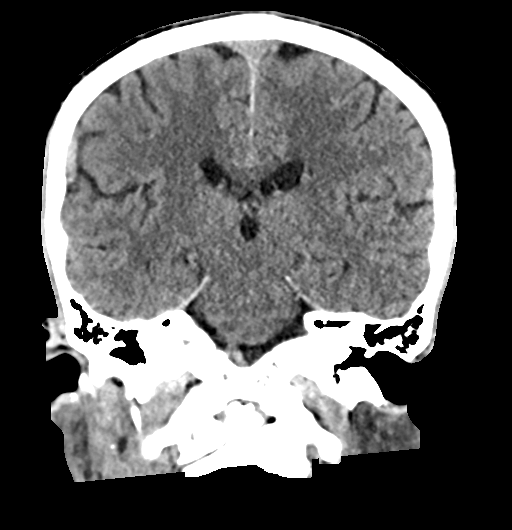

[Series 6: sag soft · sagittal · 0.36mm/px · 3 of 64 slices shown (1 of 2)]
[im 22/64  brain]
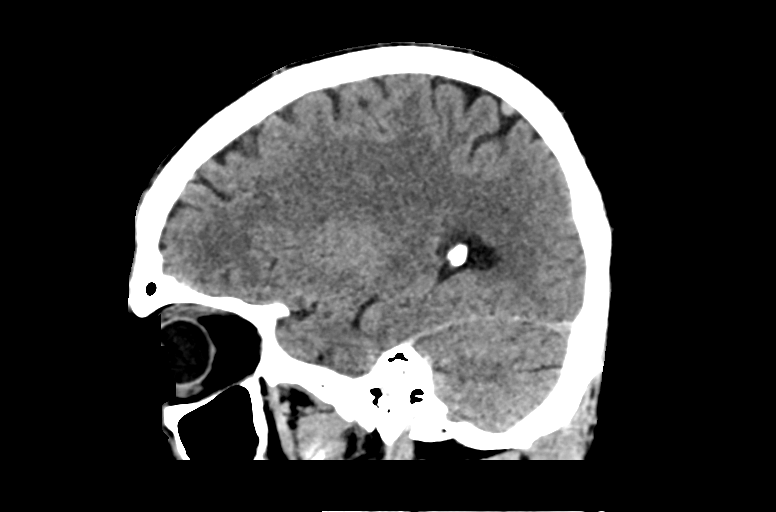
[im 32/64  brain]
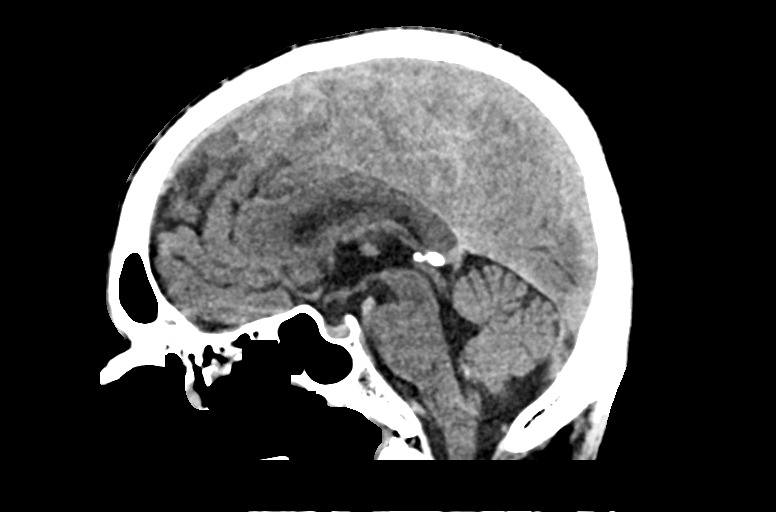
[im 43/64  brain]
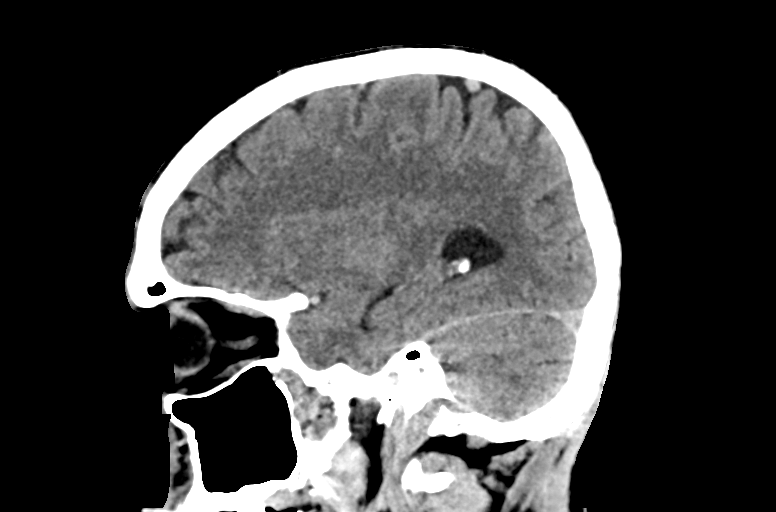

[Series 7: sag soft · axial · 0.41mm/px · z∈[+1386,+1462]mm · 5 of 55 slices shown (2 of 2)]
[im 7/55  brain]
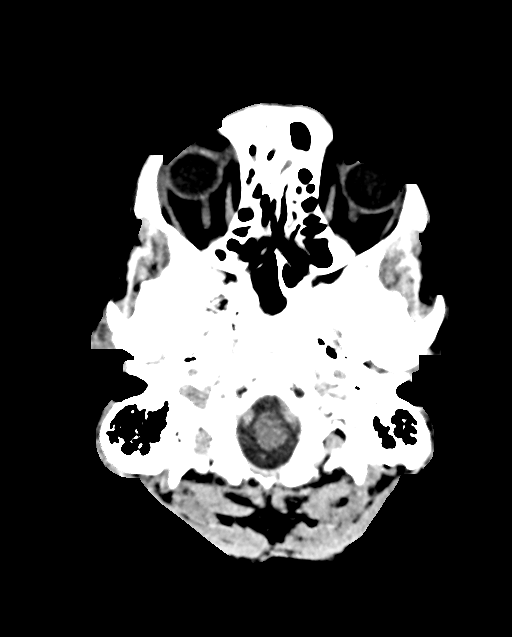
[im 14/55  brain]
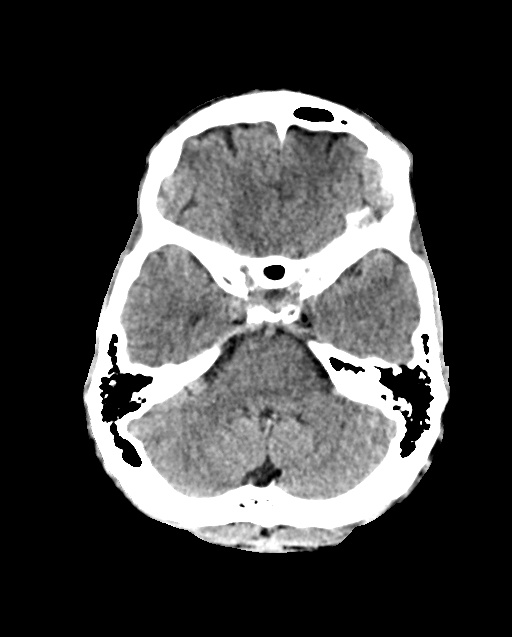
[im 21/55  brain]
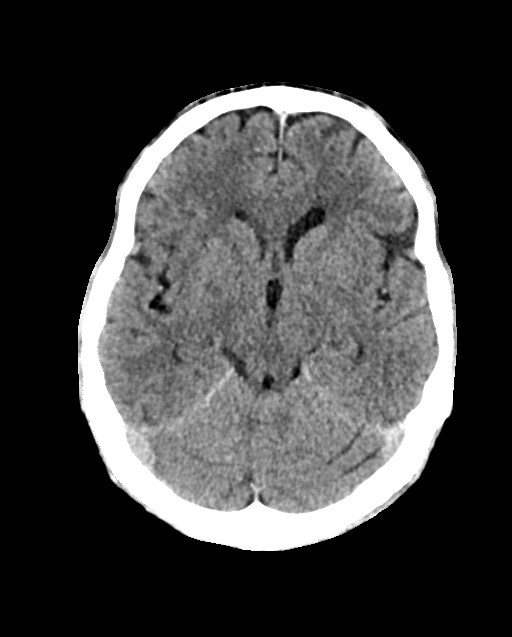
[im 28/55  brain]
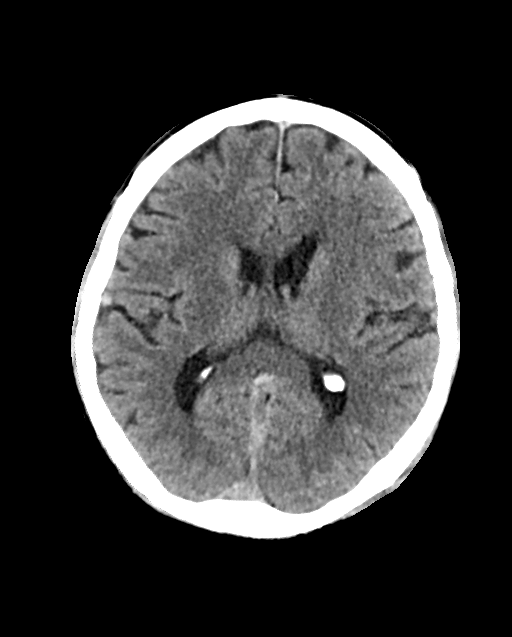
[im 34/55  brain]
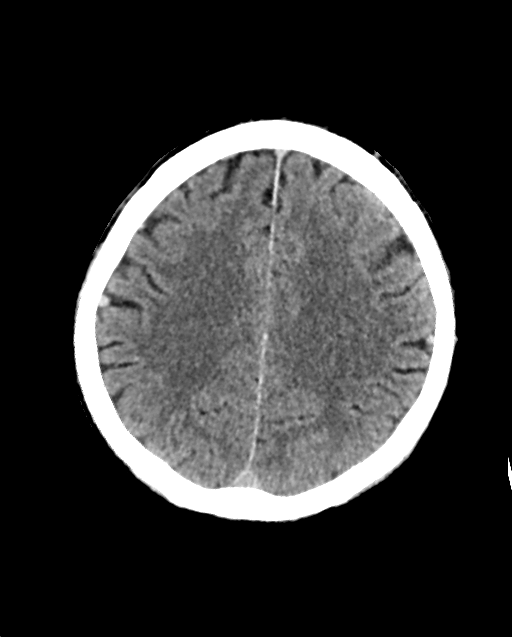

[15 of 47 positions shown; findings below may reference images not displayed]

FINDINGS: CT HEAD FINDINGS

Brain: No evidence of acute infarction, hemorrhage, hydrocephalus,
extra-axial collection or mass lesion/mass effect.

Vascular: No hyperdense vessel or unexpected calcification.

Skull: Intact.  No focal lesion.

Sinuses/Orbits: Scattered ethmoid air cell disease is seen. Small
mucous retention cyst or polyp right maxillary sinus noted.

Other: None.

CT CERVICAL SPINE FINDINGS

Alignment: Maintained.

Skull base and vertebrae: No acute fracture. No primary bone lesion
or focal pathologic process.

Soft tissues and spinal canal: No prevertebral fluid or swelling. No
visible canal hematoma.

Disc levels: Very mild loss of disc space height and endplate
spurring are seen at C3-4 and C5-6.

Upper chest: Emphysematous disease is noted.  Lung apices are clear.

Other: None.
IMPRESSION: No acute abnormality head or cervical spine.

Mild cervical degenerative change.

Emphysema.

## 2021-07-27 ENCOUNTER — Emergency Department (HOSPITAL_COMMUNITY): Payer: Medicare PPO

## 2021-07-27 ENCOUNTER — Inpatient Hospital Stay (HOSPITAL_COMMUNITY)
Admission: EM | Admit: 2021-07-27 | Discharge: 2021-07-31 | DRG: 522 | Disposition: A | Discharge status: Home Health | Payer: Medicare PPO | Attending: Family Medicine | Admitting: Family Medicine

## 2021-07-27 ENCOUNTER — Other Ambulatory Visit: Payer: Self-pay

## 2021-07-27 DIAGNOSIS — G40909 Epilepsy, unspecified, not intractable, without status epilepticus: Secondary | ICD-10-CM | POA: Diagnosis present

## 2021-07-27 DIAGNOSIS — S7291XA Unspecified fracture of right femur, initial encounter for closed fracture: Secondary | ICD-10-CM | POA: Diagnosis not present

## 2021-07-27 DIAGNOSIS — Z87891 Personal history of nicotine dependence: Secondary | ICD-10-CM

## 2021-07-27 DIAGNOSIS — E119 Type 2 diabetes mellitus without complications: Secondary | ICD-10-CM | POA: Diagnosis not present

## 2021-07-27 DIAGNOSIS — W010XXA Fall on same level from slipping, tripping and stumbling without subsequent striking against object, initial encounter: Secondary | ICD-10-CM | POA: Diagnosis present

## 2021-07-27 DIAGNOSIS — E1165 Type 2 diabetes mellitus with hyperglycemia: Secondary | ICD-10-CM | POA: Diagnosis present

## 2021-07-27 DIAGNOSIS — Z79899 Other long term (current) drug therapy: Secondary | ICD-10-CM | POA: Diagnosis not present

## 2021-07-27 DIAGNOSIS — S72001A Fracture of unspecified part of neck of right femur, initial encounter for closed fracture: Secondary | ICD-10-CM

## 2021-07-27 DIAGNOSIS — E1142 Type 2 diabetes mellitus with diabetic polyneuropathy: Secondary | ICD-10-CM | POA: Diagnosis present

## 2021-07-27 DIAGNOSIS — D62 Acute posthemorrhagic anemia: Secondary | ICD-10-CM | POA: Diagnosis not present

## 2021-07-27 DIAGNOSIS — Z7984 Long term (current) use of oral hypoglycemic drugs: Secondary | ICD-10-CM

## 2021-07-27 DIAGNOSIS — Z885 Allergy status to narcotic agent status: Secondary | ICD-10-CM | POA: Diagnosis not present

## 2021-07-27 DIAGNOSIS — E785 Hyperlipidemia, unspecified: Secondary | ICD-10-CM | POA: Diagnosis present

## 2021-07-27 DIAGNOSIS — S72011A Unspecified intracapsular fracture of right femur, initial encounter for closed fracture: Secondary | ICD-10-CM | POA: Diagnosis not present

## 2021-07-27 DIAGNOSIS — R569 Unspecified convulsions: Secondary | ICD-10-CM

## 2021-07-27 DIAGNOSIS — W19XXXA Unspecified fall, initial encounter: Secondary | ICD-10-CM

## 2021-07-27 DIAGNOSIS — G4700 Insomnia, unspecified: Secondary | ICD-10-CM | POA: Diagnosis present

## 2021-07-27 DIAGNOSIS — S72041A Displaced fracture of base of neck of right femur, initial encounter for closed fracture: Secondary | ICD-10-CM

## 2021-07-27 DIAGNOSIS — Z8616 Personal history of COVID-19: Secondary | ICD-10-CM | POA: Diagnosis not present

## 2021-07-27 LAB — CBC WITH DIFFERENTIAL/PLATELET
Abs Immature Granulocytes: 0.03 10*3/uL (ref 0.00–0.07)
Basophils Absolute: 0 10*3/uL (ref 0.0–0.1)
Basophils Relative: 0 %
Eosinophils Absolute: 0.1 10*3/uL (ref 0.0–0.5)
Eosinophils Relative: 1 %
HCT: 47.3 % (ref 39.0–52.0)
Hemoglobin: 15 g/dL (ref 13.0–17.0)
Immature Granulocytes: 0 %
Lymphocytes Relative: 9 %
Lymphs Abs: 0.8 10*3/uL (ref 0.7–4.0)
MCH: 29.4 pg (ref 26.0–34.0)
MCHC: 31.7 g/dL (ref 30.0–36.0)
MCV: 92.6 fL (ref 80.0–100.0)
Monocytes Absolute: 0.6 10*3/uL (ref 0.1–1.0)
Monocytes Relative: 7 %
Neutro Abs: 7.5 10*3/uL (ref 1.7–7.7)
Neutrophils Relative %: 83 %
Platelets: 256 10*3/uL (ref 150–400)
RBC: 5.11 MIL/uL (ref 4.22–5.81)
RDW: 13.2 % (ref 11.5–15.5)
WBC: 9.1 10*3/uL (ref 4.0–10.5)
nRBC: 0 % (ref 0.0–0.2)

## 2021-07-27 LAB — BASIC METABOLIC PANEL
Anion gap: 9 (ref 5–15)
BUN: 11 mg/dL (ref 8–23)
CO2: 24 mmol/L (ref 22–32)
Calcium: 9.5 mg/dL (ref 8.9–10.3)
Chloride: 102 mmol/L (ref 98–111)
Creatinine, Ser: 0.83 mg/dL (ref 0.61–1.24)
GFR, Estimated: >60 mL/min (ref >60)
Glucose, Bld: 165 mg/dL — ABNORMAL HIGH (ref 70–99)
Potassium: 4.3 mmol/L (ref 3.5–5.1)
Sodium: 135 mmol/L (ref 135–145)

## 2021-07-27 LAB — RESP PANEL BY RT-PCR (FLU A&B, COVID) ARPGX2
Influenza A by PCR: NEGATIVE
Influenza B by PCR: NEGATIVE
SARS Coronavirus 2 by RT PCR: POSITIVE — AB

## 2021-07-27 LAB — TYPE AND SCREEN
ABO/RH(D): A POS
Antibody Screen: NEGATIVE

## 2021-07-27 LAB — PROTIME-INR
INR: 1.1 (ref 0.8–1.2)
Prothrombin Time: 14 seconds (ref 11.4–15.2)

## 2021-07-27 LAB — GLUCOSE, CAPILLARY: Glucose-Capillary: 230 mg/dL — ABNORMAL HIGH (ref 70–99)

## 2021-07-27 MED ORDER — ENOXAPARIN SODIUM 40 MG/0.4ML IJ SOSY
40.0000 mg | PREFILLED_SYRINGE | Freq: Every day | INTRAMUSCULAR | Status: DC
  Administered 2021-07-28: 40 mg via SUBCUTANEOUS
  Filled 2021-07-27: qty 0.4

## 2021-07-27 MED ORDER — ZINC SULFATE 220 (50 ZN) MG PO CAPS
220.0000 mg | ORAL_CAPSULE | Freq: Every day | ORAL | Status: DC
  Administered 2021-07-28 – 2021-07-31 (×4): 220 mg via ORAL
  Filled 2021-07-27 (×5): qty 1

## 2021-07-27 MED ORDER — MORPHINE SULFATE (PF) 2 MG/ML IV SOLN: 0.5000 mg | INTRAVENOUS | Status: DC | PRN

## 2021-07-27 MED ORDER — POLYETHYLENE GLYCOL 3350 17 G PO PACK: 17.0000 g | PACK | Freq: Every day | ORAL | Status: DC | PRN

## 2021-07-27 MED ORDER — ONDANSETRON HCL 4 MG/2ML IJ SOLN
4.0000 mg | Freq: Once | INTRAMUSCULAR | Status: AC
  Administered 2021-07-27: 4 mg via INTRAVENOUS
  Filled 2021-07-27: qty 2

## 2021-07-27 MED ORDER — SENNOSIDES-DOCUSATE SODIUM 8.6-50 MG PO TABS
1.0000 | ORAL_TABLET | Freq: Every day | ORAL | Status: DC
  Administered 2021-07-28 – 2021-07-30 (×3): 1 via ORAL
  Filled 2021-07-27 (×3): qty 1

## 2021-07-27 MED ORDER — LACTATED RINGERS IV SOLN: INTRAVENOUS | Status: DC

## 2021-07-27 MED ORDER — LORAZEPAM 2 MG/ML IJ SOLN: 2.0000 mg | Freq: Four times a day (QID) | INTRAMUSCULAR | Status: DC | PRN

## 2021-07-27 MED ORDER — FENTANYL CITRATE (PF) 100 MCG/2ML IJ SOLN
50.0000 ug | INTRAMUSCULAR | Status: DC | PRN
  Administered 2021-07-27: 50 ug via INTRAVENOUS
  Filled 2021-07-27: qty 2

## 2021-07-27 MED ORDER — INSULIN ASPART 100 UNIT/ML IJ SOLN
0.0000 [IU] | Freq: Three times a day (TID) | INTRAMUSCULAR | Status: DC
  Administered 2021-07-28 – 2021-07-30 (×6): 1 [IU] via SUBCUTANEOUS
  Administered 2021-07-30 – 2021-07-31 (×4): 2 [IU] via SUBCUTANEOUS

## 2021-07-27 MED ORDER — ENOXAPARIN SODIUM 40 MG/0.4ML IJ SOSY: 40.0000 mg | PREFILLED_SYRINGE | INTRAMUSCULAR | Status: DC

## 2021-07-27 MED ORDER — LEVETIRACETAM 500 MG PO TABS: 500.0000 mg | ORAL_TABLET | Freq: Two times a day (BID) | ORAL | Status: DC

## 2021-07-27 MED ORDER — HYDROCODONE-ACETAMINOPHEN 5-325 MG PO TABS
1.0000 | ORAL_TABLET | Freq: Four times a day (QID) | ORAL | Status: DC | PRN
Start: 2021-07-27 — End: 2021-07-29
  Administered 2021-07-27 – 2021-07-29 (×4): 2 via ORAL
  Administered 2021-07-29: 1 via ORAL
  Filled 2021-07-27 (×3): qty 2
  Filled 2021-07-27: qty 1
  Filled 2021-07-27: qty 2

## 2021-07-27 MED ORDER — LEVETIRACETAM 500 MG PO TABS
500.0000 mg | ORAL_TABLET | Freq: Two times a day (BID) | ORAL | Status: DC
  Administered 2021-07-27 – 2021-07-31 (×8): 500 mg via ORAL
  Filled 2021-07-27 (×9): qty 1

## 2021-07-27 MED ORDER — INSULIN ASPART 100 UNIT/ML IJ SOLN
0.0000 [IU] | Freq: Every day | INTRAMUSCULAR | Status: DC
  Administered 2021-07-27 – 2021-07-30 (×2): 2 [IU] via SUBCUTANEOUS

## 2021-07-27 MED ORDER — FENTANYL CITRATE (PF) 100 MCG/2ML IJ SOLN
50.0000 ug | Freq: Once | INTRAMUSCULAR | Status: AC
Start: 2021-07-27 — End: 2021-07-27
  Administered 2021-07-27: 50 ug via INTRAVENOUS
  Filled 2021-07-27: qty 2

## 2021-07-27 MED ORDER — ACETAMINOPHEN 325 MG PO TABS: 650.0000 mg | ORAL_TABLET | Freq: Four times a day (QID) | ORAL | Status: DC | PRN

## 2021-07-27 MED ORDER — MELATONIN 3 MG PO TABS
3.0000 mg | ORAL_TABLET | Freq: Every evening | ORAL | Status: DC | PRN
  Filled 2021-07-27: qty 1

## 2021-07-27 MED ORDER — MORPHINE SULFATE (PF) 2 MG/ML IV SOLN
2.0000 mg | INTRAVENOUS | Status: DC | PRN
  Administered 2021-07-27 – 2021-07-28 (×2): 2 mg via INTRAVENOUS
  Filled 2021-07-27 (×2): qty 1

## 2021-07-27 MED ORDER — ONDANSETRON HCL 4 MG/2ML IJ SOLN: 4.0000 mg | Freq: Four times a day (QID) | INTRAMUSCULAR | Status: DC | PRN

## 2021-07-27 NOTE — ED Notes (Signed)
[facility] to unit to transfer pt to [facility] per MD order.

## 2021-07-27 NOTE — ED Triage Notes (Signed)
Pt to ED via EMS from home c/o ground level mechanical fall that occurred aprox 5 hours ago. Pt was able to crawl to living room to call family. C/O right hip pain. No obvious deformity, No LOC, Pt not on blood thinners.  Last VS: 130/84, hr 74, 100%RA.  Cbg 214 ;Medical  hx: DM, Seizures. No medications given by EMS. Recently treated for COVID.

## 2021-07-27 NOTE — H&P (Signed)
History and Physical  Sergio Dominguez DQQ:[id number] DOB: 04/13/1951 DOA: 07/27/2021  Referring physician: Dr. Anitra Lauth, EDP. PCP: Tracey Harries, MD  Outpatient Specialists: None. Patient coming from: Home.  Chief Complaint: Fall  HPI: Sergio Dominguez is a 70 y.o. male with medical history significant for type 2 diabetes, seizure disorder(first diagnosed in December 2020, last seizure was in May 2022; has had 3 seizures in his lifetime, including in March 2021) who presented to [facility] ED after a mechanical fall at home.  History is obtained from the patient, his wife at bedside, EDP, and review of medical records.  Patient was in his usual state of health prior to this event.  He was working in his garage, tripped on an object and fell on his right side.  He immediately had severe pain in his right hip.  Denies dizziness, no cardiopulmonary, or GI symptoms.  No history of frequent falls.  Not on anticoagulation or antiplatelet therapy.  No use of alcohol or illicit drugs.  EMS was activated.  He was brought to the ED for further evaluation and management.  Imagings, right hip x-ray, revealed mildly displaced right femoral neck fracture.  EDP discussed case with orthopedic surgery who recommended transfer to [facility] for possible orthopedic surgical intervention in the morning.  Patient has been made n.p.o. after midnight, except for sips and meds.  ED Course:  Temperature 98.3.  BP 129/82, pulse 94, respiration rate 22.  O2 saturation 98% on room air.  Lab studies remarkable for serum glucose of 165.  Rest of labs essentially unremarkable.  Chest x-ray no active disease.  Review of Systems: Review of systems as noted in the HPI. All other systems reviewed and are negative.   Past Medical History:  Diagnosis Date   Diabetes mellitus without complication (HCC)    Past Surgical History:  Procedure Laterality Date   "hairlip"  1961   APPENDECTOMY     ELBOW SURGERY       Social History:  reports that he quit smoking about 10 years ago. He has never used smokeless tobacco. He reports previous alcohol use. He reports previous drug use. Drug: Marijuana.   Allergies  Allergen Reactions   Oxycodone Hcl     REACTION: NAUSEA/VOMITNG    Family History  Problem Relation Age of Onset   Multiple sclerosis Mother    Multiple sclerosis Brother    Lung cancer Brother    Seizures Neg Hx       Prior to Admission medications   Medication Sig Start Date End Date Taking? Authorizing Provider  Calcium-Vitamin D ([employer] CALCIUM-600/VIT D PO) Take by mouth.   Yes [provider]  levETIRAcetam (KEPPRA) 500 MG tablet Take 1 tablet (500 mg total) by mouth 2 (two) times daily. 10/13/20  Yes Lomax, Amy, NP  metFORMIN (GLUCOPHAGE) 500 MG tablet Take 500 mg by mouth 2 (two) times daily with a meal. 12/23/19  Yes [provider]  zinc gluconate 50 MG tablet Take 50 mg by mouth daily.   Yes [provider]    Physical Exam: BP 124/82   Pulse 89   Temp 98.3 F (36.8 C) (Oral)   Resp 19   Ht 6\' 2"  (1.88 m)   Wt 70.3 kg   SpO2 95%   BMI 19.90 kg/m   General: 70 y.o. year-old male well developed well nourished in no acute distress.  Alert and oriented x3. Cardiovascular: Regular rate and rhythm with no rubs or gallops.  No  thyromegaly or JVD noted.  No lower extremity edema. 2/4 pulses in all 4 extremities. Respiratory: Clear to auscultation with no wheezes or rales. Good inspiratory effort. Abdomen: Soft nontender nondistended with normal bowel sounds x4 quadrants. Muskuloskeletal: No cyanosis, clubbing or edema noted bilaterally Neuro: CN II-XII intact, strength, sensation, reflexes Skin: No ulcerative lesions noted or rashes Psychiatry: Judgement and insight appear normal. Mood is appropriate for condition and setting          Labs on Admission:  Basic Metabolic Panel: No results for input(s): NA, K, CL, CO2, GLUCOSE, BUN,  CREATININE, CALCIUM, MG, PHOS in the last 168 hours. Liver Function Tests: No results for input(s): AST, ALT, ALKPHOS, BILITOT, PROT, ALBUMIN in the last 168 hours. No results for input(s): LIPASE, AMYLASE in the last 168 hours. No results for input(s): AMMONIA in the last 168 hours. CBC: Recent Labs  Lab 07/27/21 1836  WBC 9.1  NEUTROABS 7.5  HGB 15.0  HCT 47.3  MCV 92.6  PLT 256   Cardiac Enzymes: No results for input(s): CKTOTAL, CKMB, CKMBINDEX, TROPONINI in the last 168 hours.  BNP (last 3 results) No results for input(s): BNP in the last 8760 hours.  ProBNP (last 3 results) No results for input(s): PROBNP in the last 8760 hours.  CBG: No results for input(s): GLUCAP in the last 168 hours.  Radiological Exams on Admission: DG Chest Port 1 View  Result Date: 07/27/2021 CLINICAL DATA:  Preoperative respiratory exam.  Hip fracture. EXAM: PORTABLE CHEST 1 VIEW COMPARISON:  12/17/2019 FINDINGS: Artifact overlies the chest. Heart size is normal. Mediastinal shadows are normal. The lungs are clear. The vascularity is normal. No effusions. IMPRESSION: No active disease. Electronically Signed   By: Paulina Fusi M.D.   On: 07/27/2021 19:03   DG Knee Right Port  Result Date: 07/27/2021 CLINICAL DATA:  Closed right hip fracture.  Fall today. EXAM: PORTABLE RIGHT KNEE - 1-2 VIEW COMPARISON:  None. FINDINGS: No evidence of fracture, dislocation, or joint effusion. Minimal peripheral degenerative spurring. Quadriceps and patellar tendon enthesophyte. Soft tissues are unremarkable. IMPRESSION: No fracture or subluxation of the right knee. Electronically Signed   By: Narda Rutherford M.D.   On: 07/27/2021 19:28   DG Hip Unilat W or Wo Pelvis 2-3 Views Right  Result Date: 07/27/2021 CLINICAL DATA:  Pain after fall.  Ground level mechanical fall. EXAM: DG HIP (WITH OR WITHOUT PELVIS) 2-3V RIGHT COMPARISON:  None. FINDINGS: Right cervical neck fracture with mild displacement. Mild superior  migration of the femoral shaft. Femoral head remains seated. Mild underlying osteoarthritis. Pubic rami are intact. Remainder of the bony pelvis is intact. Pubic symphysis and sacroiliac joints are congruent. IMPRESSION: Mildly displaced right femoral neck fracture. Electronically Signed   By: Narda Rutherford M.D.   On: 07/27/2021 18:29    EKG: I independently viewed the EKG done and my findings are as followed: Sinus rhythm rate of 74, nonspecific ST-T changes.  QTc 432.  Assessment/Plan Present on Admission:  Right femoral fracture [facility])  Active Problems:   Right femoral fracture (HCC)  Right femoral fracture post mechanical fall Tripped on an object in his garage and fell on his right hip. Was in his usual state of health prior to this. Right hip x-ray showed mildly displaced femoral neck fracture. EDP discussed with Ortho, plan for orthopedic surgical intervention on 07/28/2021 N.p.o. after midnight. Not on oral anticoagulation or antiplatelet therapy prior to admission. Pain management in place Opioid analgesics with scheduled bowel regimen  Seizure  disorder First diagnosed in December 2020, last seizure was in May 2022; has had 3 seizures in his lifetime, including in March 2021. Resume home Keppra 500 mg twice daily.  Avoid skipping doses. IV Ativan as needed for breakthrough seizures  Type 2 diabetes with hyperglycemia Obtain hemoglobin A1c Hold off home hypoglycemics, he is on metformin, prior to admission. Start insulin sliding scale.  Ambulatory dysfunction post right femoral neck fracture, plan for orthopedic surgical repair. PT OT assessment under orthopedic surgery's guidance. Fall precautions Patient and wife have requested inpatient rehab after surgery. CIR consult placed to determine if the patient qualifies for inpatient rehab. TOC consulted to assist with DME needs.  DVT prophylaxis: Subcu Lovenox daily  Code Status: Full code  Family Communication: Wife at  bedside.  All questions answered to the best of my ability.  Disposition Plan: Admitted to MedSurg unit with remote telemetry.  Consults called: Orthopedic surgery consulted by EDP.  CIR and TOC have also been consulted.  Admission status: Inpatient status.  Patient will require at least 2 midnights for further evaluation and treatment of present condition.   Status is: Inpatient    Dispo:  Patient From: Home  Planned Disposition: Inpatient Rehab when okay with orthopedic surgery.  Medically stable for discharge: No         Darlin Drop MD [facility] Pager [phone]  If 7PM-7AM, please contact night-coverage [email or web address] Password [facility]  07/27/2021, 7:56 PM

## 2021-07-27 NOTE — ED Notes (Signed)
Secretary to call [facility] for transfer

## 2021-07-27 NOTE — ED Notes (Signed)
Called [facility] to transport patient to [facility]; room 1419.

## 2021-07-27 NOTE — Consult Note (Signed)
ORTHOPAEDIC CONSULTATION  REQUESTING PHYSICIAN: Darlin Drop, DO  PCP:  Tracey Harries, MD  Chief Complaint: Right hip pain  HPI: Sergio Dominguez is a 70 y.o. male who complains of right hip pain and inability to bear weight following a fall.  He was in his normal state of health when he got tripped up in the garage.  He states that prior to that he was independent with activities of daily living.  He ambulates with no assistive devices and was very active with walking with his wife for exercise and to help control his diabetes.  He denies smoking.  He does have diabetes with last A1c of 8 but he states it has been coming down.  Otherwise he was a former smoker.  He has not smoked since December 2012.  Also, he has been diagnosed with epilepsy in 2020 with just 2 seizures since the initial onset.  He takes Keppra for that.  Past Medical History:  Diagnosis Date   Diabetes mellitus without complication (HCC)    Past Surgical History:  Procedure Laterality Date   "hairlip"  1961   APPENDECTOMY     ELBOW SURGERY     Social History   Socioeconomic History   Marital status: Married    Spouse name: Not on file   Number of children: 4   Years of education: associates   Highest education level: Not on file  Occupational History   Not on file  Tobacco Use   Smoking status: Former    Types: Cigarettes    Quit date: 2012    Years since quitting: 10.5   Smokeless tobacco: Never  Vaping Use   Vaping Use: Never used  Substance and Sexual Activity   Alcohol use: Not Currently    Comment: none since 2019   Drug use: Not Currently    Types: Marijuana    Comment: stopped 12/17/2019. has smoked 1 since then.   Sexual activity: Not on file  Other Topics Concern   Not on file  Social History Narrative   Lives at home with wife   Right handed   Caffeine: at least 3 cups per day   Social Determinants of Health   Financial Resource Strain: Not on file  Food Insecurity: Not on  file  Transportation Needs: Not on file  Physical Activity: Not on file  Stress: Not on file  Social Connections: Not on file   Family History  Problem Relation Age of Onset   Multiple sclerosis Mother    Multiple sclerosis Brother    Lung cancer Brother    Seizures Neg Hx    Allergies  Allergen Reactions   Oxycodone Hcl     REACTION: NAUSEA/VOMITNG   Prior to Admission medications   Medication Sig Start Date End Date Taking? Authorizing Provider  Calcium-Vitamin D ([employer] CALCIUM-600/VIT D PO) Take by mouth.   Yes [provider]  levETIRAcetam (KEPPRA) 500 MG tablet Take 1 tablet (500 mg total) by mouth 2 (two) times daily. 10/13/20  Yes Lomax, Amy, NP  metFORMIN (GLUCOPHAGE) 500 MG tablet Take 500 mg by mouth 2 (two) times daily with a meal. 12/23/19  Yes [provider]  zinc gluconate 50 MG tablet Take 50 mg by mouth daily.   Yes [provider]   DG Chest Port 1 View  Result Date: 07/27/2021 CLINICAL DATA:  Preoperative respiratory exam.  Hip fracture. EXAM: PORTABLE CHEST 1 VIEW COMPARISON:  12/17/2019 FINDINGS: Artifact overlies the chest. Heart size is  normal. Mediastinal shadows are normal. The lungs are clear. The vascularity is normal. No effusions. IMPRESSION: No active disease. Electronically Signed   By: Paulina Fusi M.D.   On: 07/27/2021 19:03   DG Knee Right Port  Result Date: 07/27/2021 CLINICAL DATA:  Closed right hip fracture.  Fall today. EXAM: PORTABLE RIGHT KNEE - 1-2 VIEW COMPARISON:  None. FINDINGS: No evidence of fracture, dislocation, or joint effusion. Minimal peripheral degenerative spurring. Quadriceps and patellar tendon enthesophyte. Soft tissues are unremarkable. IMPRESSION: No fracture or subluxation of the right knee. Electronically Signed   By: Narda Rutherford M.D.   On: 07/27/2021 19:28   DG Hip Unilat W or Wo Pelvis 2-3 Views Right  Result Date: 07/27/2021 CLINICAL DATA:  Pain after fall.  Ground level mechanical fall.  EXAM: DG HIP (WITH OR WITHOUT PELVIS) 2-3V RIGHT COMPARISON:  None. FINDINGS: Right cervical neck fracture with mild displacement. Mild superior migration of the femoral shaft. Femoral head remains seated. Mild underlying osteoarthritis. Pubic rami are intact. Remainder of the bony pelvis is intact. Pubic symphysis and sacroiliac joints are congruent. IMPRESSION: Mildly displaced right femoral neck fracture. Electronically Signed   By: Narda Rutherford M.D.   On: 07/27/2021 18:29    Positive ROS: All other systems have been reviewed and were otherwise negative with the exception of those mentioned in the HPI and as above.  Physical Exam: General: Alert, no acute distress Cardiovascular: No pedal edema Respiratory: No cyanosis, no use of accessory musculature GI: No organomegaly, abdomen is soft and non-tender Skin: No lesions in the area of chief complaint Neurologic: Sensation intact distally Psychiatric: Patient is competent for consent with normal mood and affect Lymphatic: No axillary or cervical lymphadenopathy  MUSCULOSKELETAL:  Right lower extremity is shortened and externally rotated.  He has sensation intact light touch throughout the entire foot and ankle.  He has motor intact with dorsiflexion plantarflexion.  2+ dorsalis pedis pulse.  Assessment: Right hip femoral neck fracture  Plan: -I discussed with the patient and his wife as well as their niece over the phone, that this is an operative injury.  He will need management with an arthroplasty.  Given his level of baseline function he will need a total hip replacement.  We discussed the indications for the procedure and some of the risk and benefits.  This is something that will be performed by my partner Dr. Samson Frederic.  Due to his availability and surgical scheduling would recommend transfer to [facility] for surgery likely on Thursday with Dr. Linna Caprice.  Otherwise, he can have a diet until Wednesday night at  midnight.  - we appreciate the assistance of the [facility] hospitalist service for medical management.    Yolonda Kida, MD Cell [phone]    07/27/2021 8:17 PM

## 2021-07-27 NOTE — ED Notes (Signed)
Pt transported to Xray. 

## 2021-07-27 NOTE — ED Provider Notes (Signed)
[facility] EMERGENCY DEPARTMENT Provider Note   CSN: [id number] Arrival date & time: 07/27/21  1641     History Chief Complaint  Patient presents with   Fall   Hip Pain    right    Sergio Dominguez is a 70 y.o. male.  Patient is a 70 year old male with a history of diabetes, seizure disorder who is presenting today after a fall.  Patient was in his garage getting a bucket and mop to clean behind the refrigerator and he tripped over something losing his balance and fell forward landing on his right hip.  He was not able to stand or walk after the fall and drove himself into the house.  He got up in a chair which she reports was severely painful and then started icing the area which did help for a while.  This happened around 10:00 AM.  However the pain continued and he was still unable to get out of the chair and walk and called EMS around 3 PM.  He denies hitting his head or loss of consciousness.  He does not take anticoagulation.  No numbness or tingling in the lower leg.  No chest pain or abdominal pain.  The only pain he has is in the right hip.  It is tolerable at rest but with any movement or palpation it is a 10 out of 10.  The history is provided by the patient and the EMS personnel.  Fall  Hip Pain      Past Medical History:  Diagnosis Date   Diabetes mellitus without complication [facility])     Patient Active Problem List   Diagnosis Date Noted   Insomnia 03/29/2021   Seizures (HCC) 03/27/2020   Diabetic polyneuropathy associated with type 2 diabetes mellitus (HCC) 03/27/2020   Numbness 03/27/2020   Right shoulder pain 03/27/2020   Sinusitis 12/20/2019   Acute encephalopathy 12/18/2019   Fever 12/18/2019   Compressed spine fracture (HCC) 12/18/2019   Hyperglycemia 12/18/2019   Diabetes mellitus type 2 in nonobese (HCC) 12/18/2019   HLD (hyperlipidemia) 12/18/2019   Rhabdomyolysis 12/18/2019   Myoglobinuria 12/18/2019   Leukocytosis 12/18/2019    History of vertebral fracture 12/18/2019    Past Surgical History:  Procedure Laterality Date   "hairlip"  1961   APPENDECTOMY     ELBOW SURGERY         Family History  Problem Relation Age of Onset   Multiple sclerosis Mother    Multiple sclerosis Brother    Lung cancer Brother    Seizures Neg Hx     Social History   Tobacco Use   Smoking status: Former    Types: Cigarettes    Quit date: 2012    Years since quitting: 10.5   Smokeless tobacco: Never  Vaping Use   Vaping Use: Never used  Substance Use Topics   Alcohol use: Not Currently    Comment: none since 2019   Drug use: Not Currently    Types: Marijuana    Comment: stopped 12/17/2019. has smoked 1 since then.    Home Medications Prior to Admission medications   Medication Sig Start Date End Date Taking? Authorizing Provider  Calcium-Vitamin D ([employer] CALCIUM-600/VIT D PO) Take by mouth.   Yes [provider]  levETIRAcetam (KEPPRA) 500 MG tablet Take 1 tablet (500 mg total) by mouth 2 (two) times daily. 10/13/20  Yes Lomax, Amy, NP  metFORMIN (GLUCOPHAGE) 500 MG tablet Take 500 mg by mouth 2 (two) times daily  with a meal. 12/23/19  Yes [provider]  zinc gluconate 50 MG tablet Take 50 mg by mouth daily.   Yes [provider]    Allergies    Oxycodone hcl  Review of Systems   Review of Systems  All other systems reviewed and are negative.  Physical Exam Updated Vital Signs BP (!) 141/81   Pulse 74   Temp 98.3 F (36.8 C) (Oral)   Resp 14   Ht 6\' 2"  (1.88 m)   Wt 70.3 kg   SpO2 100%   BMI 19.90 kg/m   Physical Exam Vitals and nursing note reviewed.  Constitutional:      General: He is not in acute distress.    Appearance: Normal appearance. He is well-developed and normal weight.  HENT:     Head: Normocephalic and atraumatic.  Eyes:     Conjunctiva/sclera: Conjunctivae normal.     Pupils: Pupils are equal, round, and reactive to light.  Cardiovascular:      Rate and Rhythm: Normal rate and regular rhythm.     Pulses: Normal pulses.     Heart sounds: No murmur heard. Pulmonary:     Effort: Pulmonary effort is normal. No respiratory distress.     Breath sounds: Normal breath sounds. No wheezing or rales.  Abdominal:     General: There is no distension.     Palpations: Abdomen is soft.     Tenderness: There is no abdominal tenderness. There is no guarding or rebound.  Musculoskeletal:        General: Tenderness present.     Cervical back: Normal range of motion and neck supple.     Right hip: Tenderness and bony tenderness present. No deformity. Decreased range of motion.     Right knee: Normal.     Left knee: Normal.     Right ankle: Normal.     Left ankle: Normal.       Legs:  Skin:    General: Skin is warm and dry.     Findings: No erythema or rash.  Neurological:     Mental Status: He is alert and oriented to person, place, and time. Mental status is at baseline.     Sensory: No sensory deficit.     Motor: No weakness.  Psychiatric:        Mood and Affect: Mood normal.        Behavior: Behavior normal.    ED Results / Procedures / Treatments   Labs (all labs ordered are listed, but only abnormal results are displayed) Labs Reviewed  BASIC METABOLIC PANEL - Abnormal; Notable for the following components:      Result Value   Glucose, Bld 165 (*)    All other components within normal limits  RESP PANEL BY RT-PCR (FLU A&B, COVID) ARPGX2  CBC WITH DIFFERENTIAL/PLATELET  PROTIME-INR  HEMOGLOBIN A1C  CBC  BASIC METABOLIC PANEL  MAGNESIUM  PHOSPHORUS  TYPE AND SCREEN  ABO/RH    EKG EKG Interpretation  Date/Time:  Tuesday July 27 2021 16:45:54 EDT Ventricular Rate:  74 PR Interval:  124 QRS Duration: 100 QT Interval:  389 QTC Calculation: 432 R Axis:   33 Text Interpretation: Sinus rhythm Abnormal R-wave progression, early transition Nonspecific T abnrm, anterolateral leads No significant change since last tracing  Confirmed by [id number] (Gwyneth Sprout) on 07/27/2021 5:32:15 PM  Radiology DG Hip Unilat W or Wo Pelvis 2-3 Views Right  Result Date: 07/27/2021 CLINICAL DATA:  Pain after fall.  Ground level mechanical fall. EXAM: DG HIP (WITH OR WITHOUT PELVIS) 2-3V RIGHT COMPARISON:  None. FINDINGS: Right cervical neck fracture with mild displacement. Mild superior migration of the femoral shaft. Femoral head remains seated. Mild underlying osteoarthritis. Pubic rami are intact. Remainder of the bony pelvis is intact. Pubic symphysis and sacroiliac joints are congruent. IMPRESSION: Mildly displaced right femoral neck fracture. Electronically Signed   By: Narda Rutherford M.D.   On: 07/27/2021 18:29    Procedures Procedures   Medications Ordered in ED Medications  fentaNYL (SUBLIMAZE) injection 50 mcg (has no administration in time range)  ondansetron (ZOFRAN) injection 4 mg (has no administration in time range)    ED Course  I have reviewed the triage vital signs and the nursing notes.  Pertinent labs & imaging results that were available during my care of the patient were reviewed by me and considered in my medical decision making (see chart for details).    MDM Rules/Calculators/A&P                           Patient with a fall today at home when he tripped.  This happened at 10 AM and he has been unable to bear weight on the right leg since.  His complaint is of upper femur and hip pain.  He is hemodynamically stable.  No significant deformity or rotation of the leg.  Concern for pelvic versus hip fracture versus proximal femur fracture.  Patient was given pain control.  He takes no anticoagulation had no head injury or loss of consciousness.  He is otherwise well-appearing.  Imaging pending  6:49 PM Imaging is consistent with a mildly displaced right femoral neck fracture.  Femur fracture order set initiated.  Will speak with orthopedics.  Patient given more pain location.  8:25 PM Labs within  normal limits.  Spoke with Dr. Aundria Rud who requested patient be transferred to [facility] so he can have surgery in the morning.  Needs to be n.p.o. after midnight  MDM   Amount and/or Complexity of Data Reviewed Clinical lab tests: ordered and reviewed Tests in the radiology section of CPT: ordered and reviewed Tests in the medicine section of CPT: ordered and reviewed Decide to obtain previous medical records or to obtain history from someone other than the patient: yes Obtain history from someone other than the patient: yes Review and summarize past medical records: yes Discuss the patient with other providers: yes Independent visualization of images, tracings, or specimens: yes  Risk of Complications, Morbidity, and/or Mortality Presenting problems: high Diagnostic procedures: moderate Management options: moderate  Patient Progress Patient progress: stable    Final Clinical Impression(s) / ED Diagnoses Final diagnoses:  Fall, initial encounter  Closed right hip fracture [facility])    Rx / DC Orders ED Discharge Orders     None        Gwyneth Sprout, MD 07/27/21 2025

## 2021-07-28 ENCOUNTER — Encounter (HOSPITAL_COMMUNITY): Payer: Self-pay | Admitting: Internal Medicine

## 2021-07-28 DIAGNOSIS — E119 Type 2 diabetes mellitus without complications: Secondary | ICD-10-CM

## 2021-07-28 LAB — CBC
HCT: 43 % (ref 39.0–52.0)
Hemoglobin: 13.5 g/dL (ref 13.0–17.0)
MCH: 29 pg (ref 26.0–34.0)
MCHC: 31.4 g/dL (ref 30.0–36.0)
MCV: 92.5 fL (ref 80.0–100.0)
Platelets: 192 10*3/uL (ref 150–400)
RBC: 4.65 MIL/uL (ref 4.22–5.81)
RDW: 13.6 % (ref 11.5–15.5)
WBC: 7.9 10*3/uL (ref 4.0–10.5)
nRBC: 0 % (ref 0.0–0.2)

## 2021-07-28 LAB — MRSA NEXT GEN BY PCR, NASAL: MRSA by PCR Next Gen: NOT DETECTED

## 2021-07-28 LAB — GLUCOSE, CAPILLARY
Glucose-Capillary: 124 mg/dL — ABNORMAL HIGH (ref 70–99)
Glucose-Capillary: 140 mg/dL — ABNORMAL HIGH (ref 70–99)
Glucose-Capillary: 125 mg/dL — ABNORMAL HIGH (ref 70–99)
Glucose-Capillary: 172 mg/dL — ABNORMAL HIGH (ref 70–99)

## 2021-07-28 LAB — BASIC METABOLIC PANEL
Anion gap: 6 (ref 5–15)
BUN: 10 mg/dL (ref 8–23)
CO2: 27 mmol/L (ref 22–32)
Calcium: 8.9 mg/dL (ref 8.9–10.3)
Chloride: 102 mmol/L (ref 98–111)
Creatinine, Ser: 0.7 mg/dL (ref 0.61–1.24)
GFR, Estimated: >60 mL/min (ref >60)
Glucose, Bld: 136 mg/dL — ABNORMAL HIGH (ref 70–99)
Potassium: 4 mmol/L (ref 3.5–5.1)
Sodium: 135 mmol/L (ref 135–145)

## 2021-07-28 LAB — HEMOGLOBIN A1C
Hgb A1c MFr Bld: 7.4 % — ABNORMAL HIGH (ref 4.8–5.6)
Mean Plasma Glucose: 165.68 mg/dL

## 2021-07-28 LAB — TYPE AND SCREEN
ABO/RH(D): A POS
Antibody Screen: NEGATIVE

## 2021-07-28 LAB — PHOSPHORUS: Phosphorus: 3.1 mg/dL (ref 2.5–4.6)

## 2021-07-28 LAB — MAGNESIUM: Magnesium: 2 mg/dL (ref 1.7–2.4)

## 2021-07-28 MED ORDER — PROSOURCE PLUS PO LIQD
30.0000 mL | Freq: Two times a day (BID) | ORAL | Status: DC
  Administered 2021-07-28 – 2021-07-31 (×5): 30 mL via ORAL
  Filled 2021-07-28 (×5): qty 30

## 2021-07-28 MED ORDER — ADULT MULTIVITAMIN W/MINERALS CH
1.0000 | ORAL_TABLET | Freq: Every day | ORAL | Status: DC
  Administered 2021-07-28 – 2021-07-31 (×4): 1 via ORAL
  Filled 2021-07-28 (×4): qty 1

## 2021-07-28 MED ORDER — GLUCERNA SHAKE PO LIQD
237.0000 mL | Freq: Two times a day (BID) | ORAL | Status: DC
  Administered 2021-07-28: 237 mL via ORAL
  Filled 2021-07-28 (×7): qty 237

## 2021-07-28 NOTE — Progress Notes (Addendum)
Inpatient Rehabilitation Admissions Coordinator   Inpatient rehab consult received. We await postoperative therapy evals to assist with planning most appropriate rehab venue options.  Ottie Glazier, RN, MSN Rehab Admissions Coordinator [phone] 07/28/2021 1:50 PM                                           Inpatient rehab consult./prescreen received. Noted COVID + 8/2. Patients are eligible to be considered for admit to the [facility] when cleared from airborne precautions by acute MD or Infectious disease regardless of onset day.  Please call me with any questions. I will not pursue CIR assessment until isolation time clarified. We will sign off at this time. Acute team and TOC, Eric SW made aware.  Ottie Glazier, RN, MSN Rehab Admissions Coordinator [phone] 07/29/2021 4:38 PM

## 2021-07-28 NOTE — Progress Notes (Signed)
Initial Nutrition Assessment  DOCUMENTATION CODES:   Not applicable  INTERVENTION:  - will order Glucerna Shake BID, each supplement provides 220 kcal and 10 grams of protein. - will order 30 ml Prosource Plus BID, each supplement provides 100 kcal and 15 grams protein.  - will order 1 tablet multivitamin with minerals/day.    NUTRITION DIAGNOSIS:   Increased nutrient needs related to acute illness, catabolic illness, post-op healing (COVID-19 infection) as evidenced by estimated needs.  GOAL:   Patient will meet greater than or equal to 90% of their needs  MONITOR:   PO intake, Supplement acceptance, Labs, Weight trends  REASON FOR ASSESSMENT:   Consult Hip fracture protocol  ASSESSMENT:   70 y.o. male with medical history of type 2 DM and seizure disorder. He presented to the ED after a mechanical fall at home with subsequent severe R-sided hip pain. R hip x-ray showed displaced R femoral neck fracture.  Diet advanced from NPO to Carb Modified today at 1110; no documented intake from lunch.   He has not been seen by a [facility] RD at any time in the past.   Weight yesterday was 151 lb and PTA the most recently documented weight was on 03/29/21 when he weighed 162 lb. This indicates 11 lb weight loss (6.8% body weight) in the past 4 months; not significant for time frame.   Per notes: - R femoral neck fx s/p mechanical fall--pending surgery 8/4 - hx of type 2 DM with most recent HgbA1c: 7.4%   Labs reviewed; CBGs: 124 and 140 mg/dl. Medications reviewed; sliding scale novolog, 1 tablet senokot/day, 220 mg zinc sulfate/day. IVF; LR @ 50 ml/hr.    NUTRITION - FOCUSED PHYSICAL EXAM:  Unable to complete at this time.   Diet Order:   Diet Order             Diet NPO time specified  Diet effective midnight           Diet Carb Modified Fluid consistency: Thin; Room service appropriate? Yes  Diet effective now                   EDUCATION NEEDS:   No  education needs have been identified at this time  Skin:  Skin Assessment: Reviewed RN Assessment  Last BM:  8/1 (PTA, per patient)  Height:   Ht Readings from Last 1 Encounters:  07/27/21 6\' 2"  (1.88 m)    Weight:   Wt Readings from Last 1 Encounters:  07/27/21 68.4 kg      Estimated Nutritional Needs:  Kcal:  2000-2250 kcal Protein:  105-120 grams Fluid:  >/= 2.5 L/day      09/26/21, MS, RD, LDN, CNSC Inpatient Clinical Dietitian RD pager # available in [facility]  After hours/weekend pager # available in [facility]

## 2021-07-28 NOTE — Plan of Care (Signed)
  Problem: Clinical Measurements: Goal: Will remain free from infection Outcome: Progressing Goal: Respiratory complications will improve Outcome: Progressing   Problem: Education: Goal: Verbalization of understanding the information provided (i.e., activity precautions, restrictions, etc) will improve Outcome: Progressing   Problem: Activity: Goal: Ability to ambulate and perform ADLs will improve Outcome: Progressing   Problem: Education: Goal: Knowledge of risk factors and measures for prevention of condition will improve Outcome: Progressing   Problem: Coping: Goal: Psychosocial and spiritual needs will be supported Outcome: Progressing   Problem: Respiratory: Goal: Will maintain a patent airway Outcome: Progressing Goal: Complications related to the disease process, condition or treatment will be avoided or minimized Outcome: Progressing

## 2021-07-28 NOTE — Progress Notes (Signed)
PROGRESS NOTE    Sergio Dominguez  SVX:[id number] DOB: 04/15/1951 DOA: 07/27/2021 PCP: Tracey Harries, MD   Brief Narrative:  HPI: Sergio Dominguez is a 70 y.o. male with medical history significant for type 2 diabetes, seizure disorder(first diagnosed in December 2020, last seizure was in May 2022; has had 3 seizures in his lifetime, including in March 2021) who presented to [facility] ED after a mechanical fall at home.  History is obtained from the patient, his wife at bedside, EDP, and review of medical records.  Patient was in his usual state of health prior to this event.  He was working in his garage, tripped on an object and fell on his right side.  He immediately had severe pain in his right hip.  Denies dizziness, no cardiopulmonary, or GI symptoms.  No history of frequent falls.  Not on anticoagulation or antiplatelet therapy.  No use of alcohol or illicit drugs.  EMS was activated.  He was brought to the ED for further evaluation and management.  Imagings, right hip x-ray, revealed mildly displaced right femoral neck fracture.  EDP discussed case with orthopedic surgery who recommended transfer to [facility] for possible orthopedic surgical intervention in the morning.  Patient has been made n.p.o. after midnight, except for sips and meds.   ED Course:  Temperature 98.3.  BP 129/82, pulse 94, respiration rate 22.  O2 saturation 98% on room air.  Lab studies remarkable for serum glucose of 165.  Rest of labs essentially unremarkable.  Chest x-ray no active disease.  Assessment & Plan:   Active Problems:   Right femoral fracture (HCC)   Right femoral fracture post mechanical fall: Orthopedics on board.  Plan for surgery tomorrow, not today.  We will allow him to eat.  N.p.o. from midnight.  Further management per orthopedics.   Seizure disorder:  First diagnosed in December 2020, last seizure was in May 2022; has had 3 seizures in his lifetime, including in March 2021.   Continue Keppra 500 mg p.o. twice daily.  Seizure precautions.   Type 2 diabetes with hyperglycemia: Patient available. Metformin at home.  Hemoglobin A1c 7.4.  Continue SSI.  DVT prophylaxis: enoxaparin (LOVENOX) injection 40 mg Start: 07/27/21 2030 SCDs Start: 07/27/21 1918   Code Status: Full Code  Family Communication:  None present at bedside.  Plan of care discussed with patient in length and he verbalized understanding and agreed with it.  Status is: Inpatient  Remains inpatient appropriate because:Inpatient level of care appropriate due to severity of illness  Dispo:  Patient From: Home  Planned Disposition: Inpatient Rehab  Medically stable for discharge: No          Estimated body mass index is 19.36 kg/m as calculated from the following:   Height as of this encounter: 6\' 2"  (1.88 m).   Weight as of this encounter: 68.4 kg.     Nutritional Assessment: Body mass index is 19.36 kg/m. Seen by dietician.  I agree with the assessment and plan as outlined below: Nutrition Status:        .  Skin Assessment: I have examined the patient's skin and I agree with the wound assessment as performed by the wound care RN as outlined below:    Consultants:  Orthopedics  Procedures:  None  Antimicrobials:  Anti-infectives (From admission, onward)    None          Subjective: Seen and examined.  Complains of right hip pain but it is tolerable.  No other complaint.  He is requesting to let me know if there is no surgery.  Objective: Vitals:   07/27/21 2140 07/28/21 0317 07/28/21 0540 07/28/21 0939  BP:  (!) 111/96 104/67 95/70  Pulse:  65 65 62  Resp:  18 18 14   Temp:  98.1 F (36.7 C) 98 F (36.7 C) 98.3 F (36.8 C)  TempSrc:  Oral Oral Oral  SpO2:  98% 97% 97%  Weight: 68.4 kg     Height: 6\' 2"  (1.88 m)       Intake/Output Summary (Last 24 hours) at 07/28/2021 1106 Last data filed at 07/28/2021 0900 Gross per 24 hour  Intake 361.25 ml  Output  100 ml  Net 261.25 ml   Filed Weights   07/27/21 1646 07/27/21 2140  Weight: 70.3 kg 68.4 kg    Examination:  General exam: Appears calm and comfortable  Respiratory system: Clear to auscultation. Respiratory effort normal. Cardiovascular system: S1 & S2 heard, RRR. No JVD, murmurs, rubs, gallops or clicks. No pedal edema. Gastrointestinal system: Abdomen is nondistended, soft and nontender. No organomegaly or masses felt. Normal bowel sounds heard. Central nervous system: Alert and oriented. No focal neurological deficits. Extremities: Right lower extremity shortened and internally rotated. Skin: No rashes, lesions or ulcers Psychiatry: Judgement and insight appear normal. Mood & affect appropriate.    Data Reviewed: I have personally reviewed following labs and imaging studies  CBC: Recent Labs  Lab 07/27/21 1836 07/28/21 0439  WBC 9.1 7.9  NEUTROABS 7.5  --   HGB 15.0 13.5  HCT 47.3 43.0  MCV 92.6 92.5  PLT 256 192   Basic Metabolic Panel: Recent Labs  Lab 07/27/21 1836 07/28/21 0439  NA 135 135  K 4.3 4.0  CL 102 102  CO2 24 27  GLUCOSE 165* 136*  BUN 11 10  CREATININE 0.83 0.70  CALCIUM 9.5 8.9  MG  --  2.0  PHOS  --  3.1   GFR: Estimated Creatinine Clearance: 81.9 mL/min (by C-G formula based on SCr of 0.7 mg/dL). Liver Function Tests: No results for input(s): AST, ALT, ALKPHOS, BILITOT, PROT, ALBUMIN in the last 168 hours. No results for input(s): LIPASE, AMYLASE in the last 168 hours. No results for input(s): AMMONIA in the last 168 hours. Coagulation Profile: Recent Labs  Lab 07/27/21 1836  INR 1.1   Cardiac Enzymes: No results for input(s): CKTOTAL, CKMB, CKMBINDEX, TROPONINI in the last 168 hours. BNP (last 3 results) No results for input(s): PROBNP in the last 8760 hours. HbA1C: Recent Labs    07/28/21 0439  HGBA1C 7.4*   CBG: Recent Labs  Lab 07/27/21 2144 07/28/21 0751  GLUCAP 230* 124*   Lipid Profile: No results for  input(s): CHOL, HDL, LDLCALC, TRIG, CHOLHDL, LDLDIRECT in the last 72 hours. Thyroid Function Tests: No results for input(s): TSH, T4TOTAL, FREET4, T3FREE, THYROIDAB in the last 72 hours. Anemia Panel: No results for input(s): VITAMINB12, FOLATE, FERRITIN, TIBC, IRON, RETICCTPCT in the last 72 hours. Sepsis Labs: No results for input(s): PROCALCITON, LATICACIDVEN in the last 168 hours.  Recent Results (from the past 240 hour(s))  Resp Panel by RT-PCR (Flu A&B, Covid) Nasopharyngeal Swab     Status: Abnormal   Collection Time: 07/27/21  6:42 PM   Specimen: Nasopharyngeal Swab; Nasopharyngeal(NP) swabs in vial transport medium  Result Value Ref Range Status   SARS Coronavirus 2 by RT PCR POSITIVE (A) NEGATIVE Final    Comment: RESULT CALLED TO, READ BACK BY AND VERIFIED  WITH: RN PAULINA A [id number] FCP (NOTE) SARS-CoV-2 target nucleic acids are DETECTED.  The SARS-CoV-2 RNA is generally detectable in upper respiratory specimens during the acute phase of infection. Positive results are indicative of the presence of the identified virus, but do not rule out bacterial infection or co-infection with other pathogens not detected by the test. Clinical correlation with patient history and other diagnostic information is necessary to determine patient infection status. The expected result is Negative.  Fact Sheet for Patients: BloggerCourse.com  Fact Sheet for Healthcare Providers: SeriousBroker.it  This test is not yet approved or cleared by the [place] FDA and  has been authorized for detection and/or diagnosis of SARS-CoV-2 by FDA under an Emergency Use Authorization (EUA).  This EUA will remain in effect (meaning this test can be used ) for the duration of  the COVID-19 declaration under Section 564(b)(1) of the Act, 21 U.S.C. section 360bbb-3(b)(1), unless the authorization is terminated or revoked sooner.     Influenza A  by PCR NEGATIVE NEGATIVE Final   Influenza B by PCR NEGATIVE NEGATIVE Final    Comment: (NOTE) The Xpert Xpress SARS-CoV-2/FLU/RSV plus assay is intended as an aid in the diagnosis of influenza from Nasopharyngeal swab specimens and should not be used as a sole basis for treatment. Nasal washings and aspirates are unacceptable for Xpert Xpress SARS-CoV-2/FLU/RSV testing.  Fact Sheet for Patients: BloggerCourse.com  Fact Sheet for Healthcare Providers: SeriousBroker.it  This test is not yet approved or cleared by the [place] FDA and has been authorized for detection and/or diagnosis of SARS-CoV-2 by FDA under an Emergency Use Authorization (EUA). This EUA will remain in effect (meaning this test can be used) for the duration of the COVID-19 declaration under Section 564(b)(1) of the Act, 21 U.S.C. section 360bbb-3(b)(1), unless the authorization is terminated or revoked.  Performed at [facility], [address], [place], [place] [address]   MRSA Next Gen by PCR, Nasal     Status: None   Collection Time: 07/28/21  6:28 AM   Specimen: Nasal Mucosa; Nasal Swab  Result Value Ref Range Status   MRSA by PCR Next Gen NOT DETECTED NOT DETECTED Final    Comment: (NOTE) The GeneXpert MRSA Assay (FDA approved for NASAL specimens only), is one component of a comprehensive MRSA colonization surveillance program. It is not intended to diagnose MRSA infection nor to guide or monitor treatment for MRSA infections. Test performance is not FDA approved in patients less than 4 years old. Performed at [facility], [address] [place], [place] [address]       Radiology Studies: [facility] Chest Port 1 View  Result Date: 07/27/2021 CLINICAL DATA:  Preoperative respiratory exam.  Hip fracture. EXAM: PORTABLE CHEST 1 VIEW COMPARISON:  12/17/2019 FINDINGS: Artifact overlies the chest. Heart size is normal. Mediastinal  shadows are normal. The lungs are clear. The vascularity is normal. No effusions. IMPRESSION: No active disease. Electronically Signed   By: Paulina Fusi M.D.   On: 07/27/2021 19:03   DG Knee Right Port  Result Date: 07/27/2021 CLINICAL DATA:  Closed right hip fracture.  Fall today. EXAM: PORTABLE RIGHT KNEE - 1-2 VIEW COMPARISON:  None. FINDINGS: No evidence of fracture, dislocation, or joint effusion. Minimal peripheral degenerative spurring. Quadriceps and patellar tendon enthesophyte. Soft tissues are unremarkable. IMPRESSION: No fracture or subluxation of the right knee. Electronically Signed   By: Narda Rutherford M.D.   On: 07/27/2021 19:28   DG Hip Unilat  W or Wo Pelvis 2-3 Views Right  Result Date: 07/27/2021 CLINICAL DATA:  Pain after fall.  Ground level mechanical fall. EXAM: DG HIP (WITH OR WITHOUT PELVIS) 2-3V RIGHT COMPARISON:  None. FINDINGS: Right cervical neck fracture with mild displacement. Mild superior migration of the femoral shaft. Femoral head remains seated. Mild underlying osteoarthritis. Pubic rami are intact. Remainder of the bony pelvis is intact. Pubic symphysis and sacroiliac joints are congruent. IMPRESSION: Mildly displaced right femoral neck fracture. Electronically Signed   By: Narda RutherfordMelanie  Sanford M.D.   On: 07/27/2021 18:29    Scheduled Meds:  enoxaparin (LOVENOX) injection  40 mg Subcutaneous q1600   insulin aspart  0-5 Units Subcutaneous QHS   insulin aspart  0-9 Units Subcutaneous TID WC   levETIRAcetam  500 mg Oral BID   senna-docusate  1 tablet Oral QHS   zinc sulfate  220 mg Oral Daily   Continuous Infusions:  lactated ringers 50 mL/hr at 07/27/21 2159     LOS: 1 day   Time spent: 32 minutes   Hughie Clossavi Sanaa Zilberman, MD [facility]  07/28/2021, 11:06 AM   How to contact the [facility] Attending or Consulting provider 7A - 7P or covering provider during after hours 7P -7A, for this patient?  Check the care team in [facility] and look for a) attending/consulting [facility]  provider listed and b) the [facility] team listed. Page or secure chat 7A-7P. Log into [email or web address] and use [place]'s universal password to access. If you do not have the password, please contact the hospital operator. Locate the [facility] provider you are looking for under [facility] and page to a number that you can be directly reached. If you still have difficulty reaching the provider, please page the [facility] (Director on Call) for the Hospitalists listed on amion for assistance.

## 2021-07-29 ENCOUNTER — Inpatient Hospital Stay (HOSPITAL_COMMUNITY): Payer: Medicare PPO | Admitting: Certified Registered Nurse Anesthetist

## 2021-07-29 ENCOUNTER — Inpatient Hospital Stay (HOSPITAL_COMMUNITY): Payer: Medicare PPO

## 2021-07-29 ENCOUNTER — Encounter (HOSPITAL_COMMUNITY)
Admission: EM | Disposition: A | Discharge status: Home Health | Payer: Self-pay | Source: Home / Self Care | Attending: Family Medicine

## 2021-07-29 DIAGNOSIS — E119 Type 2 diabetes mellitus without complications: Secondary | ICD-10-CM | POA: Diagnosis not present

## 2021-07-29 DIAGNOSIS — S7291XA Unspecified fracture of right femur, initial encounter for closed fracture: Secondary | ICD-10-CM

## 2021-07-29 HISTORY — PX: TOTAL HIP ARTHROPLASTY: SHX124

## 2021-07-29 LAB — HEMOGLOBIN A1C
Hgb A1c MFr Bld: 7.7 % — ABNORMAL HIGH (ref 4.8–5.6)
Mean Plasma Glucose: 174.29 mg/dL

## 2021-07-29 LAB — BASIC METABOLIC PANEL
Anion gap: 6 (ref 5–15)
BUN: 12 mg/dL (ref 8–23)
CO2: 27 mmol/L (ref 22–32)
Calcium: 8.5 mg/dL — ABNORMAL LOW (ref 8.9–10.3)
Chloride: 103 mmol/L (ref 98–111)
Creatinine, Ser: 0.72 mg/dL (ref 0.61–1.24)
GFR, Estimated: >60 mL/min (ref >60)
Glucose, Bld: 138 mg/dL — ABNORMAL HIGH (ref 70–99)
Potassium: 4 mmol/L (ref 3.5–5.1)
Sodium: 136 mmol/L (ref 135–145)

## 2021-07-29 LAB — CBC
HCT: 39.9 % (ref 39.0–52.0)
Hemoglobin: 12.5 g/dL — ABNORMAL LOW (ref 13.0–17.0)
MCH: 29.1 pg (ref 26.0–34.0)
MCHC: 31.3 g/dL (ref 30.0–36.0)
MCV: 93 fL (ref 80.0–100.0)
Platelets: 182 10*3/uL (ref 150–400)
RBC: 4.29 MIL/uL (ref 4.22–5.81)
RDW: 13.4 % (ref 11.5–15.5)
WBC: 6.8 10*3/uL (ref 4.0–10.5)
nRBC: 0 % (ref 0.0–0.2)

## 2021-07-29 LAB — GLUCOSE, CAPILLARY
Glucose-Capillary: 136 mg/dL — ABNORMAL HIGH (ref 70–99)
Glucose-Capillary: 127 mg/dL — ABNORMAL HIGH (ref 70–99)
Glucose-Capillary: 80 mg/dL (ref 70–99)
Glucose-Capillary: 110 mg/dL — ABNORMAL HIGH (ref 70–99)
Glucose-Capillary: 115 mg/dL — ABNORMAL HIGH (ref 70–99)

## 2021-07-29 SURGERY — ARTHROPLASTY, HIP, TOTAL, ANTERIOR APPROACH
Anesthesia: General | Site: Hip | Laterality: Right

## 2021-07-29 MED ORDER — SODIUM CHLORIDE 0.9 % IV SOLN: INTRAVENOUS | Status: DC

## 2021-07-29 MED ORDER — HYDROCODONE-ACETAMINOPHEN 7.5-325 MG PO TABS
1.0000 | ORAL_TABLET | ORAL | Status: DC | PRN
  Administered 2021-07-31: 2 via ORAL
  Filled 2021-07-29: qty 2

## 2021-07-29 MED ORDER — SENNA 8.6 MG PO TABS
1.0000 | ORAL_TABLET | Freq: Two times a day (BID) | ORAL | Status: DC
  Administered 2021-07-30 – 2021-07-31 (×3): 8.6 mg via ORAL
  Filled 2021-07-29 (×4): qty 1

## 2021-07-29 MED ORDER — PHENOL 1.4 % MT LIQD
1.0000 | OROMUCOSAL | Status: DC | PRN
  Administered 2021-07-30: 1 via OROMUCOSAL
  Filled 2021-07-29: qty 177

## 2021-07-29 MED ORDER — DEXAMETHASONE SODIUM PHOSPHATE 10 MG/ML IJ SOLN
INTRAMUSCULAR | Status: AC
  Filled 2021-07-29: qty 1

## 2021-07-29 MED ORDER — ONDANSETRON HCL 4 MG/2ML IJ SOLN: 4.0000 mg | Freq: Four times a day (QID) | INTRAMUSCULAR | Status: DC | PRN

## 2021-07-29 MED ORDER — CEFAZOLIN SODIUM-DEXTROSE 2-4 GM/100ML-% IV SOLN
2.0000 g | Freq: Four times a day (QID) | INTRAVENOUS | Status: AC
  Administered 2021-07-30 (×2): 2 g via INTRAVENOUS
  Filled 2021-07-29 (×2): qty 100

## 2021-07-29 MED ORDER — ENOXAPARIN SODIUM 40 MG/0.4ML IJ SOSY
40.0000 mg | PREFILLED_SYRINGE | INTRAMUSCULAR | Status: DC
  Administered 2021-07-30 – 2021-07-31 (×2): 40 mg via SUBCUTANEOUS
  Filled 2021-07-29 (×2): qty 0.4

## 2021-07-29 MED ORDER — SODIUM CHLORIDE (PF) 0.9 % IJ SOLN
INTRAMUSCULAR | Status: AC
  Filled 2021-07-29: qty 30

## 2021-07-29 MED ORDER — HYDROCODONE-ACETAMINOPHEN 5-325 MG PO TABS
1.0000 | ORAL_TABLET | ORAL | Status: DC | PRN
  Administered 2021-07-30 – 2021-07-31 (×3): 2 via ORAL
  Filled 2021-07-29 (×3): qty 2

## 2021-07-29 MED ORDER — PROPOFOL 10 MG/ML IV BOLUS
INTRAVENOUS | Status: DC | PRN
  Administered 2021-07-29: 150 mg via INTRAVENOUS

## 2021-07-29 MED ORDER — MORPHINE SULFATE (PF) 2 MG/ML IV SOLN: 0.5000 mg | INTRAVENOUS | Status: DC | PRN

## 2021-07-29 MED ORDER — FENTANYL CITRATE (PF) 100 MCG/2ML IJ SOLN: 25.0000 ug | INTRAMUSCULAR | Status: DC | PRN

## 2021-07-29 MED ORDER — ACETAMINOPHEN 325 MG PO TABS: 325.0000 mg | ORAL_TABLET | Freq: Four times a day (QID) | ORAL | Status: DC | PRN

## 2021-07-29 MED ORDER — METHOCARBAMOL 1000 MG/10ML IJ SOLN
500.0000 mg | Freq: Four times a day (QID) | INTRAVENOUS | Status: DC | PRN
  Filled 2021-07-29: qty 5

## 2021-07-29 MED ORDER — ONDANSETRON HCL 4 MG/2ML IJ SOLN
INTRAMUSCULAR | Status: AC
  Filled 2021-07-29: qty 2

## 2021-07-29 MED ORDER — CEFAZOLIN SODIUM-DEXTROSE 2-4 GM/100ML-% IV SOLN
2.0000 g | INTRAVENOUS | Status: AC
  Administered 2021-07-29: 2 g via INTRAVENOUS
  Filled 2021-07-29: qty 100

## 2021-07-29 MED ORDER — FENTANYL CITRATE (PF) 100 MCG/2ML IJ SOLN
INTRAMUSCULAR | Status: DC | PRN
  Administered 2021-07-29: 25 ug via INTRAVENOUS
  Administered 2021-07-29: 50 ug via INTRAVENOUS
  Administered 2021-07-29: 25 ug via INTRAVENOUS

## 2021-07-29 MED ORDER — SUCCINYLCHOLINE CHLORIDE 200 MG/10ML IV SOSY
PREFILLED_SYRINGE | INTRAVENOUS | Status: DC | PRN
  Administered 2021-07-29: 160 mg via INTRAVENOUS

## 2021-07-29 MED ORDER — TRANEXAMIC ACID-NACL 1000-0.7 MG/100ML-% IV SOLN
1000.0000 mg | INTRAVENOUS | Status: DC
  Filled 2021-07-29: qty 100

## 2021-07-29 MED ORDER — WATER FOR IRRIGATION, STERILE IR SOLN
Status: DC | PRN
  Administered 2021-07-29: 2000 mL

## 2021-07-29 MED ORDER — CELECOXIB 200 MG PO CAPS
200.0000 mg | ORAL_CAPSULE | Freq: Two times a day (BID) | ORAL | Status: DC
  Administered 2021-07-29 – 2021-07-31 (×4): 200 mg via ORAL
  Filled 2021-07-29 (×5): qty 1

## 2021-07-29 MED ORDER — PHENYLEPHRINE 40 MCG/ML (10ML) SYRINGE FOR IV PUSH (FOR BLOOD PRESSURE SUPPORT)
PREFILLED_SYRINGE | INTRAVENOUS | Status: AC
  Filled 2021-07-29: qty 10

## 2021-07-29 MED ORDER — LIDOCAINE 2% (20 MG/ML) 5 ML SYRINGE
INTRAMUSCULAR | Status: DC | PRN
  Administered 2021-07-29: 20 mg via INTRAVENOUS
  Administered 2021-07-29: 80 mg via INTRAVENOUS

## 2021-07-29 MED ORDER — BUPIVACAINE-EPINEPHRINE (PF) 0.25% -1:200000 IJ SOLN
INTRAMUSCULAR | Status: AC
  Filled 2021-07-29: qty 30

## 2021-07-29 MED ORDER — SODIUM CHLORIDE (PF) 0.9 % IJ SOLN
INTRAMUSCULAR | Status: DC | PRN
  Administered 2021-07-29: 30 mL

## 2021-07-29 MED ORDER — DOCUSATE SODIUM 100 MG PO CAPS
100.0000 mg | ORAL_CAPSULE | Freq: Two times a day (BID) | ORAL | Status: DC
  Administered 2021-07-29 – 2021-07-31 (×4): 100 mg via ORAL
  Filled 2021-07-29 (×4): qty 1

## 2021-07-29 MED ORDER — ONDANSETRON HCL 4 MG PO TABS: 4.0000 mg | ORAL_TABLET | Freq: Four times a day (QID) | ORAL | Status: DC | PRN

## 2021-07-29 MED ORDER — METHOCARBAMOL 500 MG PO TABS: 500.0000 mg | ORAL_TABLET | Freq: Four times a day (QID) | ORAL | Status: DC | PRN

## 2021-07-29 MED ORDER — METOCLOPRAMIDE HCL 5 MG/ML IJ SOLN: 5.0000 mg | Freq: Three times a day (TID) | INTRAMUSCULAR | Status: DC | PRN

## 2021-07-29 MED ORDER — KETOROLAC TROMETHAMINE 30 MG/ML IJ SOLN
INTRAMUSCULAR | Status: AC
  Filled 2021-07-29: qty 1

## 2021-07-29 MED ORDER — BUPIVACAINE-EPINEPHRINE 0.25% -1:200000 IJ SOLN
INTRAMUSCULAR | Status: DC | PRN
  Administered 2021-07-29: 30 mL

## 2021-07-29 MED ORDER — POVIDONE-IODINE 10 % EX SWAB: 2.0000 "application " | Freq: Once | CUTANEOUS | Status: DC

## 2021-07-29 MED ORDER — SODIUM CHLORIDE 0.9 % IR SOLN
Status: DC | PRN
  Administered 2021-07-29 (×2): 1000 mL

## 2021-07-29 MED ORDER — 0.9 % SODIUM CHLORIDE (POUR BTL) OPTIME
TOPICAL | Status: DC | PRN
  Administered 2021-07-29: 1000 mL

## 2021-07-29 MED ORDER — METOCLOPRAMIDE HCL 5 MG PO TABS: 5.0000 mg | ORAL_TABLET | Freq: Three times a day (TID) | ORAL | Status: DC | PRN

## 2021-07-29 MED ORDER — PHENYLEPHRINE 40 MCG/ML (10ML) SYRINGE FOR IV PUSH (FOR BLOOD PRESSURE SUPPORT)
PREFILLED_SYRINGE | INTRAVENOUS | Status: DC | PRN
  Administered 2021-07-29 (×2): 120 ug via INTRAVENOUS
  Administered 2021-07-29 (×2): 80 ug via INTRAVENOUS

## 2021-07-29 MED ORDER — KETOROLAC TROMETHAMINE 30 MG/ML IJ SOLN
INTRAMUSCULAR | Status: DC | PRN
  Administered 2021-07-29: 30 mg via INTRAVENOUS

## 2021-07-29 MED ORDER — ROCURONIUM BROMIDE 10 MG/ML (PF) SYRINGE
PREFILLED_SYRINGE | INTRAVENOUS | Status: DC | PRN
  Administered 2021-07-29: 10 mg via INTRAVENOUS
  Administered 2021-07-29: 40 mg via INTRAVENOUS

## 2021-07-29 MED ORDER — CHLORHEXIDINE GLUCONATE 4 % EX LIQD
60.0000 mL | Freq: Once | CUTANEOUS | Status: DC
  Filled 2021-07-29 (×3): qty 60

## 2021-07-29 MED ORDER — DEXAMETHASONE SODIUM PHOSPHATE 10 MG/ML IJ SOLN
INTRAMUSCULAR | Status: DC | PRN
  Administered 2021-07-29: 5 mg via INTRAVENOUS

## 2021-07-29 MED ORDER — ONDANSETRON HCL 4 MG/2ML IJ SOLN
INTRAMUSCULAR | Status: DC | PRN
  Administered 2021-07-29: 4 mg via INTRAVENOUS

## 2021-07-29 MED ORDER — SUGAMMADEX SODIUM 200 MG/2ML IV SOLN
INTRAVENOUS | Status: DC | PRN
Start: 2021-07-29 — End: 2021-07-29
  Administered 2021-07-29: 200 mg via INTRAVENOUS

## 2021-07-29 MED ORDER — TRANEXAMIC ACID-NACL 1000-0.7 MG/100ML-% IV SOLN
1000.0000 mg | Freq: Once | INTRAVENOUS | Status: AC
  Administered 2021-07-29: 1000 mg via INTRAVENOUS
  Filled 2021-07-29: qty 100

## 2021-07-29 MED ORDER — FENTANYL CITRATE (PF) 100 MCG/2ML IJ SOLN
INTRAMUSCULAR | Status: AC
  Filled 2021-07-29: qty 2

## 2021-07-29 MED ORDER — MENTHOL 3 MG MT LOZG: 1.0000 | LOZENGE | OROMUCOSAL | Status: DC | PRN

## 2021-07-29 SURGICAL SUPPLY — 64 items
ADH SKN CLS APL DERMABOND .7 (GAUZE/BANDAGES/DRESSINGS) ×1
APL PRP STRL LF DISP 70% ISPRP (MISCELLANEOUS) ×1
BAG COUNTER SPONGE SURGICOUNT (BAG) IMPLANT
BAG DECANTER FOR FLEXI CONT (MISCELLANEOUS) IMPLANT
BAG SPEC THK2 15X12 ZIP CLS (MISCELLANEOUS)
BAG SPNG CNTER NS LX DISP (BAG)
BAG ZIPLOCK 12X15 (MISCELLANEOUS) IMPLANT
BLADE SURG SZ10 CARB STEEL (BLADE) IMPLANT
CHLORAPREP W/TINT 26 (MISCELLANEOUS) ×2 IMPLANT
COVER PERINEAL POST (MISCELLANEOUS) ×2 IMPLANT
COVER SURGICAL LIGHT HANDLE (MISCELLANEOUS) ×2 IMPLANT
CUP SECTOR GRIPTON 58MM (Orthopedic Implant) ×1 IMPLANT
DECANTER SPIKE VIAL GLASS SM (MISCELLANEOUS) ×2 IMPLANT
DERMABOND ADVANCED (GAUZE/BANDAGES/DRESSINGS) ×1
DERMABOND ADVANCED .7 DNX12 (GAUZE/BANDAGES/DRESSINGS) ×2 IMPLANT
DRAPE IMP U-DRAPE 54X76 (DRAPES) ×2 IMPLANT
DRAPE SHEET LG 3/4 BI-LAMINATE (DRAPES) ×6 IMPLANT
DRAPE STERI IOBAN 125X83 (DRAPES) IMPLANT
DRAPE U-SHAPE 47X51 STRL (DRAPES) ×4 IMPLANT
DRESSING AQUACEL AG SP 3.5X10 (GAUZE/BANDAGES/DRESSINGS) IMPLANT
DRSG AQUACEL AG ADV 3.5X10 (GAUZE/BANDAGES/DRESSINGS) ×2 IMPLANT
DRSG AQUACEL AG SP 3.5X10 (GAUZE/BANDAGES/DRESSINGS) ×2
ELECT REM PT RETURN 15FT ADLT (MISCELLANEOUS) ×2 IMPLANT
GAUZE SPONGE 4X4 12PLY STRL (GAUZE/BANDAGES/DRESSINGS) ×2 IMPLANT
GLOVE SRG 8 PF TXTR STRL LF DI (GLOVE) ×1 IMPLANT
GLOVE SURG ENC MOIS LTX SZ8.5 (GLOVE) ×4 IMPLANT
GLOVE SURG ENC TEXT LTX SZ7.5 (GLOVE) ×4 IMPLANT
GLOVE SURG UNDER POLY LF SZ8 (GLOVE) ×2
GLOVE SURG UNDER POLY LF SZ8.5 (GLOVE) ×2 IMPLANT
GOWN SPEC L3 XXLG W/TWL (GOWN DISPOSABLE) ×2 IMPLANT
GOWN STRL REUS W/TWL XL LVL3 (GOWN DISPOSABLE) ×2 IMPLANT
HANDPIECE INTERPULSE COAX TIP (DISPOSABLE) ×2
HEAD CERAMIC 36 PLUS5 (Hips) ×1 IMPLANT
HOLDER FOLEY CATH W/STRAP (MISCELLANEOUS) ×2 IMPLANT
HOOD PEEL AWAY FLYTE STAYCOOL (MISCELLANEOUS) ×8 IMPLANT
JET LAVAGE IRRISEPT WOUND (IRRIGATION / IRRIGATOR) ×2
KIT TURNOVER KIT A (KITS) ×2 IMPLANT
LAVAGE JET IRRISEPT WOUND (IRRIGATION / IRRIGATOR) IMPLANT
LINER NEUTRAL 36X58 PLUS4 ×1 IMPLANT
MANIFOLD NEPTUNE II (INSTRUMENTS) ×2 IMPLANT
MARKER SKIN DUAL TIP RULER LAB (MISCELLANEOUS) ×2 IMPLANT
NDL SAFETY ECLIPSE 18X1.5 (NEEDLE) ×1 IMPLANT
NDL SPNL 18GX3.5 QUINCKE PK (NEEDLE) ×1 IMPLANT
NEEDLE HYPO 18GX1.5 SHARP (NEEDLE) ×2
NEEDLE SPNL 18GX3.5 QUINCKE PK (NEEDLE) ×2 IMPLANT
PACK ANTERIOR HIP CUSTOM (KITS) ×2 IMPLANT
PENCIL SMOKE EVACUATOR (MISCELLANEOUS) IMPLANT
SAW OSC TIP CART 19.5X105X1.3 (SAW) ×2 IMPLANT
SEALER BIPOLAR AQUA 6.0 (INSTRUMENTS) ×2 IMPLANT
SET HNDPC FAN SPRY TIP SCT (DISPOSABLE) ×1 IMPLANT
STAPLER VISISTAT 35W (STAPLE) ×1 IMPLANT
STEM TRI LOC BPS GRIP SZ12 HIP (Hips) IMPLANT
SUT MNCRL AB 3-0 PS2 18 (SUTURE) ×2 IMPLANT
SUT MNCRL AB 4-0 PS2 18 (SUTURE) ×2 IMPLANT
SUT MON AB 2-0 CT1 36 (SUTURE) ×4 IMPLANT
SUT STRATAFIX PDO 1 14 VIOLET (SUTURE) ×2
SUT STRATFX PDO 1 14 VIOLET (SUTURE) ×1
SUT VIC AB 2-0 CT1 27 (SUTURE) ×2
SUT VIC AB 2-0 CT1 TAPERPNT 27 (SUTURE) ×1 IMPLANT
SUTURE STRATFX PDO 1 14 VIOLET (SUTURE) ×1 IMPLANT
SYR 3ML LL SCALE MARK (SYRINGE) ×2 IMPLANT
TRAY FOLEY MTR SLVR 16FR STAT (SET/KITS/TRAYS/PACK) IMPLANT
TRI LOC BPS W/GRIP SZ 12 HIP (Hips) ×2 IMPLANT
TUBE SUCTION HIGH CAP CLEAR NV (SUCTIONS) ×2 IMPLANT

## 2021-07-29 NOTE — Anesthesia Procedure Notes (Signed)
Procedure Name: Intubation Date/Time: 07/29/2021 5:29 PM Performed by: Ezekiel Ina, CRNA Pre-anesthesia Checklist: Patient identified, Emergency Drugs available, Suction available and Patient being monitored Patient Re-evaluated:Patient Re-evaluated prior to induction Oxygen Delivery Method: Circle system utilized Preoxygenation: Pre-oxygenation with 100% oxygen Induction Type: IV induction Laryngoscope Size: Miller and 2 Grade View: Grade III Tube type: Oral Tube size: 7.5 mm Number of attempts: 1 Airway Equipment and Method: Stylet Placement Confirmation: ETT inserted through vocal cords under direct vision, positive ETCO2 and breath sounds checked- equal and bilateral Secured at: 23 cm Tube secured with: Tape Dental Injury: Teeth and Oropharynx as per pre-operative assessment  Difficulty Due To: Difficulty was unanticipated Future Recommendations: Recommend- induction with short-acting agent, and alternative techniques readily available Comments: Elective RSI d/t Covid. Grade 3 view with Mil2. Would recommend Glidescope in the future

## 2021-07-29 NOTE — Progress Notes (Addendum)
PROGRESS NOTE    Sergio Dominguez  ZOX:[id number] DOB: 1951-07-10 DOA: 07/27/2021 PCP: Tracey Harries, MD   Brief Narrative:  HPI: Sergio Dominguez is a 70 y.o. male with medical history significant for type 2 diabetes, seizure disorder(first diagnosed in December 2020, last seizure was in May 2022; has had 3 seizures in his lifetime, including in March 2021) who presented to [facility] ED after a mechanical fall at home.  History is obtained from the patient, his wife at bedside, EDP, and review of medical records.  Patient was in his usual state of health prior to this event.  He was working in his garage, tripped on an object and fell on his right side.  He immediately had severe pain in his right hip.  Denies dizziness, no cardiopulmonary, or GI symptoms.  No history of frequent falls.  Not on anticoagulation or antiplatelet therapy.  No use of alcohol or illicit drugs.  EMS was activated.  He was brought to the ED for further evaluation and management.  Imagings, right hip x-ray, revealed mildly displaced right femoral neck fracture.  EDP discussed case with orthopedic surgery who recommended transfer to [facility] for possible orthopedic surgical intervention in the morning.  Patient has been made n.p.o. after midnight, except for sips and meds.   ED Course:  Temperature 98.3.  BP 129/82, pulse 94, respiration rate 22.  O2 saturation 98% on room air.  Lab studies remarkable for serum glucose of 165.  Rest of labs essentially unremarkable.  Chest x-ray no active disease.  Assessment & Plan:   Active Problems:   Diabetes mellitus type 2 in nonobese (HCC)   HLD (hyperlipidemia)   Seizures (HCC)   Diabetic polyneuropathy associated with type 2 diabetes mellitus (HCC)   Right femoral fracture (HCC)   Right femoral fracture post mechanical fall: Doing well.  Pain controlled.  Ortho on board.  Plan for surgery this afternoon.  Patient has questions regarding the procedure itself.   Defer to orthopedics.  COVID-positive: Patient tells me that he was initially tested +2 weeks ago and at that time he had few symptoms.  He went to urgent care center and was treated with 5 days of Paxlovid.  He currently has no symptoms.  No indication of treatment.  Chest x-ray unremarkable.  He is not hypoxic.   Seizure disorder:  First diagnosed in December 2020, last seizure was in May 2022; has had 3 seizures in his lifetime, including in March 2021.  Continue Keppra 500 mg p.o. twice daily.  Seizure precautions.   Type 2 diabetes with hyperglycemia: Stable. Takes metformin at home.  Hemoglobin A1c 7.4.  Continue SSI.  DVT prophylaxis: enoxaparin (LOVENOX) injection 40 mg Start: 07/27/21 2030 SCDs Start: 07/27/21 1918   Code Status: Full Code  Family Communication:  None present at bedside.  Plan of care discussed with patient in length and he verbalized understanding and agreed with it.  Status is: Inpatient  Remains inpatient appropriate because:Inpatient level of care appropriate due to severity of illness  Dispo:  Patient From: Home  Planned Disposition: Home  Medically stable for discharge: No          Estimated body mass index is 19.36 kg/m as calculated from the following:   Height as of this encounter: 6\' 2"  (1.88 m).   Weight as of this encounter: 68.4 kg.     Nutritional Assessment: Body mass index is 19.36 kg/m. Seen by dietician.  I agree with the assessment and  plan as outlined below: Nutrition Status: Nutrition Problem: Increased nutrient needs Etiology: acute illness, catabolic illness, post-op healing (COVID-19 infection) Signs/Symptoms: estimated needs Interventions: Glucerna shake, Prostat, MVI  . Consultants:  Orthopedics  Procedures:  None  Antimicrobials:  Anti-infectives (From admission, onward)    Start     Dose/Rate Route Frequency Ordered Stop   07/29/21 1500  ceFAZolin (ANCEF) IVPB 2g/100 mL premix        2 g 200 mL/hr over  30 Minutes Intravenous On call to O.R. 07/29/21 1016 07/30/21 0559          Subjective: Seen and examined.  No new complaint.  Pain controlled.  Objective: Vitals:   07/28/21 1303 07/28/21 2105 07/29/21 0558 07/29/21 0755  BP: 97/65 100/66 105/65 105/68  Pulse: 80 84 73 71  Resp: 14 20 18    Temp: 98.1 F (36.7 C) 98.1 F (36.7 C) 98.5 F (36.9 C) 98.4 F (36.9 C)  TempSrc: Oral Oral Oral Oral  SpO2: 94% 95% 95% 96%  Weight:      Height:        Intake/Output Summary (Last 24 hours) at 07/29/2021 1053 Last data filed at 07/29/2021 0317 Gross per 24 hour  Intake 1903.73 ml  Output 400 ml  Net 1503.73 ml    Filed Weights   07/27/21 1646 07/27/21 2140  Weight: 70.3 kg 68.4 kg    Examination:  General exam: Appears calm and comfortable  Respiratory system: Clear to auscultation. Respiratory effort normal. Cardiovascular system: S1 & S2 heard, RRR. No JVD, murmurs, rubs, gallops or clicks. No pedal edema. Gastrointestinal system: Abdomen is nondistended, soft and nontender. No organomegaly or masses felt. Normal bowel sounds heard. Central nervous system: Alert and oriented. No focal neurological deficits. Skin: No rashes, lesions or ulcers.  Psychiatry: Judgement and insight appear normal. Mood & affect appropriate.   Data Reviewed: I have personally reviewed following labs and imaging studies  CBC: Recent Labs  Lab 07/27/21 1836 07/28/21 0439 07/29/21 0507  WBC 9.1 7.9 6.8  NEUTROABS 7.5  --   --   HGB 15.0 13.5 12.5*  HCT 47.3 43.0 39.9  MCV 92.6 92.5 93.0  PLT 256 192 182    Basic Metabolic Panel: Recent Labs  Lab 07/27/21 1836 07/28/21 0439 07/29/21 0507  NA 135 135 136  K 4.3 4.0 4.0  CL 102 102 103  CO2 24 27 27   GLUCOSE 165* 136* 138*  BUN 11 10 12   CREATININE 0.83 0.70 0.72  CALCIUM 9.5 8.9 8.5*  MG  --  2.0  --   PHOS  --  3.1  --     GFR: Estimated Creatinine Clearance: 81.9 mL/min (by C-G formula based on SCr of 0.72 mg/dL). Liver  Function Tests: No results for input(s): AST, ALT, ALKPHOS, BILITOT, PROT, ALBUMIN in the last 168 hours. No results for input(s): LIPASE, AMYLASE in the last 168 hours. No results for input(s): AMMONIA in the last 168 hours. Coagulation Profile: Recent Labs  Lab 07/27/21 1836  INR 1.1    Cardiac Enzymes: No results for input(s): CKTOTAL, CKMB, CKMBINDEX, TROPONINI in the last 168 hours. BNP (last 3 results) No results for input(s): PROBNP in the last 8760 hours. HbA1C: Recent Labs    07/28/21 0439  HGBA1C 7.4*    CBG: Recent Labs  Lab 07/28/21 0751 07/28/21 1125 07/28/21 1649 07/28/21 2101 07/29/21 0750  GLUCAP 124* 140* 125* 172* 136*    Lipid Profile: No results for input(s): CHOL, HDL, LDLCALC, TRIG, CHOLHDL,  LDLDIRECT in the last 72 hours. Thyroid Function Tests: No results for input(s): TSH, T4TOTAL, FREET4, T3FREE, THYROIDAB in the last 72 hours. Anemia Panel: No results for input(s): VITAMINB12, FOLATE, FERRITIN, TIBC, IRON, RETICCTPCT in the last 72 hours. Sepsis Labs: No results for input(s): PROCALCITON, LATICACIDVEN in the last 168 hours.  Recent Results (from the past 240 hour(s))  Resp Panel by RT-PCR (Flu A&B, Covid) Nasopharyngeal Swab     Status: Abnormal   Collection Time: 07/27/21  6:42 PM   Specimen: Nasopharyngeal Swab; Nasopharyngeal(NP) swabs in vial transport medium  Result Value Ref Range Status   SARS Coronavirus 2 by RT PCR POSITIVE (A) NEGATIVE Final    Comment: RESULT CALLED TO, READ BACK BY AND VERIFIED WITH: RN PAULINA A [id number] FCP (NOTE) SARS-CoV-2 target nucleic acids are DETECTED.  The SARS-CoV-2 RNA is generally detectable in upper respiratory specimens during the acute phase of infection. Positive results are indicative of the presence of the identified virus, but do not rule out bacterial infection or co-infection with other pathogens not detected by the test. Clinical correlation with patient history and other  diagnostic information is necessary to determine patient infection status. The expected result is Negative.  Fact Sheet for Patients: BloggerCourse.com  Fact Sheet for Healthcare Providers: SeriousBroker.it  This test is not yet approved or cleared by the [place] FDA and  has been authorized for detection and/or diagnosis of SARS-CoV-2 by FDA under an Emergency Use Authorization (EUA).  This EUA will remain in effect (meaning this test can be used ) for the duration of  the COVID-19 declaration under Section 564(b)(1) of the Act, 21 U.S.C. section 360bbb-3(b)(1), unless the authorization is terminated or revoked sooner.     Influenza A by PCR NEGATIVE NEGATIVE Final   Influenza B by PCR NEGATIVE NEGATIVE Final    Comment: (NOTE) The Xpert Xpress SARS-CoV-2/FLU/RSV plus assay is intended as an aid in the diagnosis of influenza from Nasopharyngeal swab specimens and should not be used as a sole basis for treatment. Nasal washings and aspirates are unacceptable for Xpert Xpress SARS-CoV-2/FLU/RSV testing.  Fact Sheet for Patients: BloggerCourse.com  Fact Sheet for Healthcare Providers: SeriousBroker.it  This test is not yet approved or cleared by the [place] FDA and has been authorized for detection and/or diagnosis of SARS-CoV-2 by FDA under an Emergency Use Authorization (EUA). This EUA will remain in effect (meaning this test can be used) for the duration of the COVID-19 declaration under Section 564(b)(1) of the Act, 21 U.S.C. section 360bbb-3(b)(1), unless the authorization is terminated or revoked.  Performed at [facility], [address], [place], [place] [address]   MRSA Next Gen by PCR, Nasal     Status: None   Collection Time: 07/28/21  6:28 AM   Specimen: Nasal Mucosa; Nasal Swab  Result Value Ref Range Status   MRSA by PCR Next Gen NOT DETECTED  NOT DETECTED Final    Comment: (NOTE) The GeneXpert MRSA Assay (FDA approved for NASAL specimens only), is one component of a comprehensive MRSA colonization surveillance program. It is not intended to diagnose MRSA infection nor to guide or monitor treatment for MRSA infections. Test performance is not FDA approved in patients less than 1 years old. Performed at [facility], [address] [place], [place] [address]        Radiology Studies: [facility] Chest Port 1 View  Result Date: 07/27/2021 CLINICAL DATA:  Preoperative respiratory exam.  Hip fracture. EXAM: PORTABLE  CHEST 1 VIEW COMPARISON:  12/17/2019 FINDINGS: Artifact overlies the chest. Heart size is normal. Mediastinal shadows are normal. The lungs are clear. The vascularity is normal. No effusions. IMPRESSION: No active disease. Electronically Signed   By: Paulina Fusi M.D.   On: 07/27/2021 19:03   DG Knee Right Port  Result Date: 07/27/2021 CLINICAL DATA:  Closed right hip fracture.  Fall today. EXAM: PORTABLE RIGHT KNEE - 1-2 VIEW COMPARISON:  None. FINDINGS: No evidence of fracture, dislocation, or joint effusion. Minimal peripheral degenerative spurring. Quadriceps and patellar tendon enthesophyte. Soft tissues are unremarkable. IMPRESSION: No fracture or subluxation of the right knee. Electronically Signed   By: Narda Rutherford M.D.   On: 07/27/2021 19:28   DG Hip Unilat W or Wo Pelvis 2-3 Views Right  Result Date: 07/27/2021 CLINICAL DATA:  Pain after fall.  Ground level mechanical fall. EXAM: DG HIP (WITH OR WITHOUT PELVIS) 2-3V RIGHT COMPARISON:  None. FINDINGS: Right cervical neck fracture with mild displacement. Mild superior migration of the femoral shaft. Femoral head remains seated. Mild underlying osteoarthritis. Pubic rami are intact. Remainder of the bony pelvis is intact. Pubic symphysis and sacroiliac joints are congruent. IMPRESSION: Mildly displaced right femoral neck fracture. Electronically Signed    By: Narda Rutherford M.D.   On: 07/27/2021 18:29    Scheduled Meds:  (feeding supplement) PROSource Plus  30 mL Oral BID BM   chlorhexidine  60 mL Topical Once   enoxaparin (LOVENOX) injection  40 mg Subcutaneous q1600   feeding supplement (GLUCERNA SHAKE)  237 mL Oral BID BM   insulin aspart  0-5 Units Subcutaneous QHS   insulin aspart  0-9 Units Subcutaneous TID WC   levETIRAcetam  500 mg Oral BID   multivitamin with minerals  1 tablet Oral Daily   povidone-iodine  2 application Topical Once   povidone-iodine  2 application Topical Once   senna-docusate  1 tablet Oral QHS   zinc sulfate  220 mg Oral Daily   Continuous Infusions:   ceFAZolin (ANCEF) IV     lactated ringers 50 mL/hr at 07/29/21 0317   tranexamic acid       LOS: 2 days   Time spent: 28 minutes   Hughie Closs, MD [facility]  07/29/2021, 10:53 AM   How to contact the [facility] Attending or Consulting provider 7A - 7P or covering provider during after hours 7P -7A, for this patient?  Check the care team in [facility] and look for a) attending/consulting TR[facility] provider listed and b) the [facility] team listed. Page or secure chat 7A-7P. Log into [email or web address] and use [place]'s universal password to access. If you do not have the password, please contact the hospital operator. Locate the [facility] provider you are looking for under [facility] and page to a number that you can be directly reached. If you still have difficulty reaching the provider, please page the [facility] (Director on Call) for the Hospitalists listed on amion for assistance.

## 2021-07-29 NOTE — Anesthesia Preprocedure Evaluation (Addendum)
Anesthesia Evaluation  Patient identified by MRN, date of birth, ID band Patient awake    Reviewed: Allergy & Precautions, NPO status , Patient's Chart, lab work & pertinent test results  Airway Mallampati: II  TM Distance: >3 FB Neck ROM: Full    Dental no notable dental hx.    Pulmonary former smoker,  + COVID , symptoms resolved   Pulmonary exam normal breath sounds clear to auscultation       Cardiovascular negative cardio ROS Normal cardiovascular exam Rhythm:Regular Rate:Normal  Sinus rhythm Abnormal R-wave progression, early transition Nonspecific T abnrm, anterolateral leads No significant change since last tracing Confirmed by Gwyneth Sprout ([id number]) on 07/27/2021 5:32:15 PM   Neuro/Psych Seizures - (on Keppra),  negative neurological ROS  negative psych ROS   GI/Hepatic negative GI ROS, Neg liver ROS, (+)     substance abuse  marijuana use,   Endo/Other  negative endocrine ROSdiabetes, Poorly Controlled, Type 2, Oral Hypoglycemic AgentsHyperlipidemia  Renal/GU negative Renal ROS  negative genitourinary   Musculoskeletal negative musculoskeletal ROS (+)   Abdominal   Peds negative pediatric ROS (+)  Hematology negative hematology ROS (+)   Anesthesia Other Findings   Reproductive/Obstetrics negative OB ROS                          Anesthesia Physical Anesthesia Plan  ASA: 3  Anesthesia Plan: General   Post-op Pain Management:    Induction: Rapid sequence  PONV Risk Score and Plan: 2 and Treatment may vary due to age or medical condition and Ondansetron  Airway Management Planned: Oral ETT  Additional Equipment: None  Intra-op Plan:   Post-operative Plan: Extubation in OR  Informed Consent: I have reviewed the patients History and Physical, chart, labs and discussed the procedure including the risks, benefits and alternatives for the proposed anesthesia with the  patient or authorized representative who has indicated his/her understanding and acceptance.     Dental advisory given  Plan Discussed with: CRNA, Anesthesiologist and Surgeon  Anesthesia Plan Comments:        Anesthesia Quick Evaluation

## 2021-07-29 NOTE — Op Note (Signed)
OPERATIVE REPORT  SURGEON: Samson Frederic, MD   ASSISTANT: Barrie Dunker, PA-C  PREOPERATIVE DIAGNOSIS: Displaced Right femoral neck fracture.   POSTOPERATIVE DIAGNOSIS: Displaced Right femoral neck fracture.   PROCEDURE: Right total hip arthroplasty, anterior approach.   IMPLANTS: DePuy Tri Lock stem, size 12, hi offset. DePuy Pinnacle Cup, size 58 mm. DePuy Altrx liner, size 36 by 58 mm, +4 neutral. DePuy Biolox ceramic head ball, size 36 + 5 mm.  ANESTHESIA:  General  ANTIBIOTICS: 2g ancef.  ESTIMATED BLOOD LOSS:-150 mL    DRAINS: None.  COMPLICATIONS: None   CONDITION: PACU - hemodynamically stable.   BRIEF CLINICAL NOTE: Sergio Dominguez is a 70 y.o. male with a displaced Right femoral neck fracture. The patient was admitted to the hospitalist service and underwent perioperative risk stratification and medical optimization. The risks, benefits, and alternatives to total hip arthroplasty were explained, and the patient elected to proceed.  PROCEDURE IN DETAIL: The patient was taken to the operating room and general anesthesia was induced on the hospital bed.  The patient was then positioned on the Hana table.  All bony prominences were well padded.  The hip was prepped and draped in the normal sterile surgical fashion.  A time-out was called verifying side and site of surgery. Antibiotics were given within 60 minutes of beginning the procedure.  Bikini incision was made, and the direct anterior approach to the hip was performed through the Hueter interval.  Lateral femoral circumflex vessels were treated with the Auqumantys. The anterior capsule was exposed and an inverted T capsulotomy was made.  Fracture hematoma was encountered and evacuated. The patient was found to have a comminuted vertical Right subcapital femoral neck fracture.  I freshened the femoral neck cut with a saw.  I removed the femoral neck fragment.  A corkscrew was placed into the head and the head was  removed.  This was passed to the back table and was measured.   Acetabular exposure was achieved, and the pulvinar and labrum were excised. Sequential reaming of the acetabulum was then performed up to a size 57 mm reamer. A 58 mm cup was then opened and impacted into place at approximately 40 degrees of abduction and 20 degrees of anteversion. The final polyethylene liner was impacted into place.    I then gained femoral exposure taking care to protect the abductors and greater trochanter.  This was performed using standard external rotation, extension, and adduction.  The superior capsule was incised longitudinally, taking care to stay lateral to the posterior border of the femoral neck. A cookie cutter was used to enter the femoral canal, and then the femoral canal finder was used to confirm location.  I then sequentially broached up to a size 12.  Calcar planer was used on the femoral neck remnant.  I paced a hi neck and a trial head ball. The hip was reduced.  Leg lengths were checked fluoroscopically.  The hip was dislocated and trial components were removed.  I placed the real stem followed by the real spacer and head ball.  The hip was reduced.  Fluoroscopy was used to confirm component position and leg lengths.  At 90 degrees of external rotation and extension, the hip was stable to an anterior directed force.   The wound was copiously irrigated with Irrisept solution and normal saline using pule lavage.  Marcaine solution was injected into the periarticular soft tissue.  The wound was closed in layers using #1 Vicryl and V-Loc for the fascia, 2-0  Vicryl for the subcutaneous fat, 2-0 Monocryl for the deep dermal layer, and staples + glue for the skin.  Once the glue was fully dried, an Aquacell Ag dressing was applied.  The patient was then awakened from anesthesia and transported to the recovery room in stable condition.  Sponge, needle, and instrument counts were correct at the end of the case x2.   The patient tolerated the procedure well and there were no known complications.  Please note that a surgical assistant was a medical necessity for this procedure to perform it in a safe and expeditious manner. Assistant was necessary to provide appropriate retraction of vital neurovascular structures, to prevent femoral fracture, and to allow for anatomic placement of the prosthesis.

## 2021-07-29 NOTE — Discharge Instructions (Signed)
? ?Dr. Alger Kerstein ?Joint Replacement Specialist ?[facility] ?[address] ?, [place] [address] ?[phone] ? ? ?TOTAL HIP REPLACEMENT POSTOPERATIVE DIRECTIONS ? ? ? ?Hip Rehabilitation, Guidelines Following Surgery  ? ?WEIGHT BEARING ?Weight bearing as tolerated with assist device (walker, cane, etc) as directed, use it as long as suggested by your surgeon or therapist, typically at least 4-6 weeks. ? ?The results of a hip operation are greatly improved after range of motion and muscle strengthening exercises. Follow all safety measures which are given to protect your hip. If any of these exercises cause increased pain or swelling in your joint, decrease the amount until you are comfortable again. Then slowly increase the exercises. Call your caregiver if you have problems or questions.  ? ?HOME CARE INSTRUCTIONS  ?Most of the following instructions are designed to prevent the dislocation of your new hip.  ?Remove items at home which could result in a fall. This includes throw rugs or furniture in walking pathways.  ?Continue medications as instructed at time of discharge. ?You may have some home medications which will be placed on hold until you complete the course of blood thinner medication. ?You may start showering once you are discharged home. Do not remove your dressing. ?Do not put on socks or shoes without following the instructions of your caregivers.   ?Sit on chairs with arms. Use the chair arms to help push yourself up when arising.  ?Arrange for the use of a toilet seat elevator so you are not sitting low.  ?Walk with walker as instructed.  ?You may resume a sexual relationship in one month or when given the OK by your caregiver.  ?Use walker as long as suggested by your caregivers.  ?You may put full weight on your legs and walk as much as is comfortable. ?Avoid periods of inactivity such as sitting longer than an hour when not asleep. This helps prevent blood  clots.  ?You may return to work once you are cleared by your surgeon.  ?Do not drive a car for 6 weeks or until released by your surgeon.  ?Do not drive while taking narcotics.  ?Wear elastic stockings for two weeks following surgery during the day but you may remove then at night.  ?Make sure you keep all of your appointments after your operation with all of your doctors and caregivers. You should call the office at the above phone number and make an appointment for approximately two weeks after the date of your surgery. ?Please pick up a stool softener and laxative for home use as long as you are requiring pain medications. ?ICE to the affected hip every three hours for 30 minutes at a time and then as needed for pain and swelling. Continue to use ice on the hip for pain and swelling from surgery. You may notice swelling that will progress down to the foot and ankle.  This is normal after surgery.  Elevate the leg when you are not up walking on it.   ?It is important for you to complete the blood thinner medication as prescribed by your doctor. ?Continue to use the breathing machine which will help keep your temperature down.  It is common for your temperature to cycle up and down following surgery, especially at night when you are not up moving around and exerting yourself.  The breathing machine keeps your lungs expanded and your temperature down. ? ?RANGE OF MOTION AND STRENGTHENING EXERCISES  ?These exercises are designed to help you   keep full movement of your hip joint. Follow your caregiver's or physical therapist's instructions. Perform all exercises about fifteen times, three times per day or as directed. Exercise both hips, even if you have had only one joint replacement. These exercises can be done on a training (exercise) mat, on the floor, on a table or on a bed. Use whatever works the best and is most comfortable for you. Use music or television while you are exercising so that the exercises are a  pleasant break in your day. This will make your life better with the exercises acting as a break in routine you can look forward to.  ?Lying on your back, slowly slide your foot toward your buttocks, raising your knee up off the floor. Then slowly slide your foot back down until your leg is straight again.  ?Lying on your back spread your legs as far apart as you can without causing discomfort.  ?Lying on your side, raise your upper leg and foot straight up from the floor as far as is comfortable. Slowly lower the leg and repeat.  ?Lying on your back, tighten up the muscle in the front of your thigh (quadriceps muscles). You can do this by keeping your leg straight and trying to raise your heel off the floor. This helps strengthen the largest muscle supporting your knee.  ?Lying on your back, tighten up the muscles of your buttocks both with the legs straight and with the knee bent at a comfortable angle while keeping your heel on the floor.  ? ?SKILLED REHAB INSTRUCTIONS: ?If the patient is transferred to a skilled rehab facility following release from the hospital, a list of the current medications will be sent to the facility for the patient to continue.  When discharged from the skilled rehab facility, please have the facility set up the patient's Home Health Physical Therapy prior to being released. Also, the skilled facility will be responsible for providing the patient with their medications at time of release from the facility to include their pain medication and their blood thinner medication. If the patient is still at the rehab facility at time of the two week follow up appointment, the skilled rehab facility will also need to assist the patient in arranging follow up appointment in our office and any transportation needs. ? ?POST-OPERATIVE OPIOID TAPER INSTRUCTIONS: ?It is important to wean off of your opioid medication as soon as possible. If you do not need pain medication after your surgery it is ok  to stop day one. ?Opioids include: ?Codeine, Hydrocodone(Norco, Vicodin), Oxycodone(Percocet, oxycontin) and hydromorphone amongst others.  ?Long term and even short term use of opiods can cause: ?Increased pain response ?Dependence ?Constipation ?Depression ?Respiratory depression ?And more.  ?Withdrawal symptoms can include ?Flu like symptoms ?Nausea, vomiting ?And more ?Techniques to manage these symptoms ?Hydrate well ?Eat regular healthy meals ?Stay active ?Use relaxation techniques(deep breathing, meditating, yoga) ?Do Not substitute Alcohol to help with tapering ?If you have been on opioids for less than two weeks and do not have pain than it is ok to stop all together.  ?Plan to wean off of opioids ?This plan should start within one week post op of your joint replacement. ?Maintain the same interval or time between taking each dose and first decrease the dose.  ?Cut the total daily intake of opioids by one tablet each day ?Next start to increase the time between doses. ?The last dose that should be eliminated is the evening dose.  ? ? ?MAKE   SURE YOU:  ?Understand these instructions.  ?Will watch your condition.  ?Will get help right away if you are not doing well or get worse. ? ?Pick up stool softner and laxative for home use following surgery while on pain medications. ?Do not remove your dressing. ?The dressing is waterproof--it is OK to take showers. ?Continue to use ice for pain and swelling after surgery. ?Do not use any lotions or creams on the incision until instructed by your surgeon. ?Total Hip Protocol. ? ?

## 2021-07-29 NOTE — Transfer of Care (Signed)
Immediate Anesthesia Transfer of Care Note  Patient: Sergio Dominguez  Procedure(s) Performed: TOTAL HIP ARTHROPLASTY ANTERIOR APPROACH (Right: Hip)  Patient Location: PACU  Anesthesia Type:General  Level of Consciousness: awake, alert  and oriented  Airway & Oxygen Therapy: Patient Spontanous Breathing and Patient connected to face mask  Post-op Assessment: Report given to RN and Post -op Vital signs reviewed and stable  Post vital signs: Reviewed and stable  Last Vitals:  Vitals Value Taken Time  BP 122/80 07/29/21 1930  Temp    Pulse 89 07/29/21 1929  Resp 20 07/29/21 1931  SpO2 100 % 07/29/21 1929  Vitals shown include unvalidated device data.  Last Pain:  Vitals:   07/29/21 1258  TempSrc: Oral  PainSc:       Patients Stated Pain Goal: 0 (07/29/21 0912)  Complications: No notable events documented.

## 2021-07-29 NOTE — Plan of Care (Signed)
  Problem: Education: Goal: Knowledge of General Education information will improve Description: Including pain rating scale, medication(s)/side effects and non-pharmacologic comfort measures Outcome: Progressing   Problem: Clinical Measurements: Goal: Ability to maintain clinical measurements within normal limits will improve Outcome: Progressing Goal: Respiratory complications will improve Outcome: Progressing   Problem: Education: Goal: Verbalization of understanding the information provided (i.e., activity precautions, restrictions, etc) will improve Outcome: Progressing Goal: Individualized Educational Video(s) Outcome: Progressing   Problem: Pain Management: Goal: Pain level will decrease Outcome: Progressing   Problem: Coping: Goal: Psychosocial and spiritual needs will be supported Outcome: Progressing

## 2021-07-30 DIAGNOSIS — S7291XA Unspecified fracture of right femur, initial encounter for closed fracture: Secondary | ICD-10-CM | POA: Diagnosis not present

## 2021-07-30 DIAGNOSIS — E119 Type 2 diabetes mellitus without complications: Secondary | ICD-10-CM | POA: Diagnosis not present

## 2021-07-30 LAB — CBC
HCT: 39.3 % (ref 39.0–52.0)
Hemoglobin: 12.3 g/dL — ABNORMAL LOW (ref 13.0–17.0)
MCH: 29.1 pg (ref 26.0–34.0)
MCHC: 31.3 g/dL (ref 30.0–36.0)
MCV: 92.9 fL (ref 80.0–100.0)
Platelets: 189 10*3/uL (ref 150–400)
RBC: 4.23 MIL/uL (ref 4.22–5.81)
RDW: 13.1 % (ref 11.5–15.5)
WBC: 8.9 10*3/uL (ref 4.0–10.5)
nRBC: 0 % (ref 0.0–0.2)

## 2021-07-30 LAB — BASIC METABOLIC PANEL
Anion gap: 5 (ref 5–15)
BUN: 13 mg/dL (ref 8–23)
CO2: 25 mmol/L (ref 22–32)
Calcium: 8.6 mg/dL — ABNORMAL LOW (ref 8.9–10.3)
Chloride: 104 mmol/L (ref 98–111)
Creatinine, Ser: 0.89 mg/dL (ref 0.61–1.24)
GFR, Estimated: >60 mL/min (ref >60)
Glucose, Bld: 197 mg/dL — ABNORMAL HIGH (ref 70–99)
Potassium: 4.6 mmol/L (ref 3.5–5.1)
Sodium: 134 mmol/L — ABNORMAL LOW (ref 135–145)

## 2021-07-30 LAB — GLUCOSE, CAPILLARY
Glucose-Capillary: 147 mg/dL — ABNORMAL HIGH (ref 70–99)
Glucose-Capillary: 163 mg/dL — ABNORMAL HIGH (ref 70–99)
Glucose-Capillary: 181 mg/dL — ABNORMAL HIGH (ref 70–99)
Glucose-Capillary: 244 mg/dL — ABNORMAL HIGH (ref 70–99)

## 2021-07-30 MED ORDER — ASPIRIN 81 MG PO CHEW: 81.0000 mg | CHEWABLE_TABLET | Freq: Two times a day (BID) | ORAL | 0 refills | Status: AC

## 2021-07-30 MED ORDER — HYDROCODONE-ACETAMINOPHEN 5-325 MG PO TABS: 1.0000 | ORAL_TABLET | ORAL | 0 refills | Status: DC | PRN

## 2021-07-30 NOTE — Progress Notes (Signed)
PROGRESS NOTE    Sergio Dominguez  HBZ:[id number] DOB: 03/31/1951 DOA: 07/27/2021 PCP: Tracey Harries, MD   Brief Narrative:  Sergio Dominguez is a 70 y.o. male with medical history significant for type 2 diabetes, seizure disorder(first diagnosed in December 2020, last seizure was in May 2022; has had 3 seizures in his lifetime, including in March 2021) who presented to [facility] ED after a mechanical fall at home. right hip x-ray, revealed mildly displaced right femoral neck fracture.  Patient was hemodynamically stable in the ED however he was tested positive for COVID-19.  He was admitted under hospitalist service and orthopedics consulted.  He eventually underwent right total hip arthroplasty on 07/29/2021.  Assessment & Plan:   Active Problems:   Diabetes mellitus type 2 in nonobese (HCC)   HLD (hyperlipidemia)   Seizures (HCC)   Diabetic polyneuropathy associated with type 2 diabetes mellitus (HCC)   Right femoral fracture (HCC)   Right femoral fracture post mechanical fall: S/p right hip arthroplasty.  Doing well.  Pain controlled.  Seen by OT who recommended home health OT.  Patient not comfortable going home yet.  To be seen by PT.  He is requesting 1 more day in the hospital.  COVID-positive: Patient initially tested positive on 08/11/2021 and was tested with 5 days of paxlovid.  Currently has no symptoms.  Removing isolation after discussing with infection prevention.   Seizure disorder:  First diagnosed in December 2020, last seizure was in May 2022; has had 3 seizures in his lifetime, including in March 2021.  Continue Keppra 500 mg p.o. twice daily.  Seizure precautions.   Type 2 diabetes with hyperglycemia: Stable. Takes metformin at home.  Hemoglobin A1c 7.4.  Continue SSI.  DVT prophylaxis: enoxaparin (LOVENOX) injection 40 mg Start: 07/30/21 0800 SCDs Start: 07/29/21 2103   Code Status: Full Code  Family Communication:  None present at bedside.  Plan of care  discussed with patient in length and he verbalized understanding and agreed with it.  Status is: Inpatient  Remains inpatient appropriate because:Inpatient level of care appropriate due to severity of illness  Dispo:  Patient From: Home  Planned Disposition: Home  Medically stable for discharge: No          Estimated body mass index is 19.36 kg/m as calculated from the following:   Height as of this encounter: 6\' 2"  (1.88 m).   Weight as of this encounter: 68.4 kg.     Nutritional Assessment: Body mass index is 19.36 kg/m. Seen by dietician.  I agree with the assessment and plan as outlined below: Nutrition Status: Nutrition Problem: Increased nutrient needs Etiology: acute illness, catabolic illness, post-op healing (COVID-19 infection) Signs/Symptoms: estimated needs Interventions: Glucerna shake, Prostat, MVI  . Consultants:  Orthopedics  Procedures:  Right hip arthroplasty  Antimicrobials:  Anti-infectives (From admission, onward)    Start     Dose/Rate Route Frequency Ordered Stop   07/30/21 0200  ceFAZolin (ANCEF) IVPB 2g/100 mL premix        2 g 200 mL/hr over 30 Minutes Intravenous Every 6 hours 07/29/21 2102 07/30/21 0849   07/29/21 1500  ceFAZolin (ANCEF) IVPB 2g/100 mL premix        2 g 200 mL/hr over 30 Minutes Intravenous On call to O.R. 07/29/21 1016 07/29/21 1740          Subjective: Seen and examined.  Doing well.  Pain controlled.  Objective: Vitals:   07/29/21 2025 07/29/21 2030 07/29/21 2052 07/30/21 0436  BP:  127/78 113/79 93/72  Pulse: 96 96 (!) 102 87  Resp: 17 20 18 18   Temp: 98.4 F (36.9 C)  98.2 F (36.8 C) 98.3 F (36.8 C)  TempSrc:   Oral Oral  SpO2: 94% 96% 100% 98%  Weight:      Height:        Intake/Output Summary (Last 24 hours) at 07/30/2021 1017 Last data filed at 07/30/2021 0900 Gross per 24 hour  Intake 1986.98 ml  Output 1125 ml  Net 861.98 ml    Filed Weights   07/27/21 1646 07/27/21 2140   Weight: 70.3 kg 68.4 kg    Examination:  General exam: Appears calm and comfortable  Respiratory system: Clear to auscultation. Respiratory effort normal. Cardiovascular system: S1 & S2 heard, RRR. No JVD, murmurs, rubs, gallops or clicks. No pedal edema. Gastrointestinal system: Abdomen is nondistended, soft and nontender. No organomegaly or masses felt. Normal bowel sounds heard. Central nervous system: Alert and oriented. No focal neurological deficits. Skin: No rashes, lesions or ulcers.  Psychiatry: Judgement and insight appear normal. Mood & affect appropriate.   Data Reviewed: I have personally reviewed following labs and imaging studies  CBC: Recent Labs  Lab 07/27/21 1836 07/28/21 0439 07/29/21 0507 07/30/21 0449  WBC 9.1 7.9 6.8 8.9  NEUTROABS 7.5  --   --   --   HGB 15.0 13.5 12.5* 12.3*  HCT 47.3 43.0 39.9 39.3  MCV 92.6 92.5 93.0 92.9  PLT 256 192 182 189    Basic Metabolic Panel: Recent Labs  Lab 07/27/21 1836 07/28/21 0439 07/29/21 0507 07/30/21 0449  NA 135 135 136 134*  K 4.3 4.0 4.0 4.6  CL 102 102 103 104  CO2 24 27 27 25   GLUCOSE 165* 136* 138* 197*  BUN 11 10 12 13   CREATININE 0.83 0.70 0.72 0.89  CALCIUM 9.5 8.9 8.5* 8.6*  MG  --  2.0  --   --   PHOS  --  3.1  --   --     GFR: Estimated Creatinine Clearance: 73.7 mL/min (by C-G formula based on SCr of 0.89 mg/dL). Liver Function Tests: No results for input(s): AST, ALT, ALKPHOS, BILITOT, PROT, ALBUMIN in the last 168 hours. No results for input(s): LIPASE, AMYLASE in the last 168 hours. No results for input(s): AMMONIA in the last 168 hours. Coagulation Profile: Recent Labs  Lab 07/27/21 1836  INR 1.1    Cardiac Enzymes: No results for input(s): CKTOTAL, CKMB, CKMBINDEX, TROPONINI in the last 168 hours. BNP (last 3 results) No results for input(s): PROBNP in the last 8760 hours. HbA1C: Recent Labs    07/27/21 1836 07/28/21 0439  HGBA1C 7.7* 7.4*    CBG: Recent Labs   Lab 07/29/21 1255 07/29/21 1631 07/29/21 1944 07/29/21 2048 07/30/21 0738  GLUCAP 127* 80 110* 115* 147*    Lipid Profile: No results for input(s): CHOL, HDL, LDLCALC, TRIG, CHOLHDL, LDLDIRECT in the last 72 hours. Thyroid Function Tests: No results for input(s): TSH, T4TOTAL, FREET4, T3FREE, THYROIDAB in the last 72 hours. Anemia Panel: No results for input(s): VITAMINB12, FOLATE, FERRITIN, TIBC, IRON, RETICCTPCT in the last 72 hours. Sepsis Labs: No results for input(s): PROCALCITON, LATICACIDVEN in the last 168 hours.  Recent Results (from the past 240 hour(s))  Resp Panel by RT-PCR (Flu A&B, Covid) Nasopharyngeal Swab     Status: Abnormal   Collection Time: 07/27/21  6:42 PM   Specimen: Nasopharyngeal Swab; Nasopharyngeal(NP) swabs in vial transport medium  Result Value Ref  Range Status   SARS Coronavirus 2 by RT PCR POSITIVE (A) NEGATIVE Final    Comment: RESULT CALLED TO, READ BACK BY AND VERIFIED WITH: RN PAULINA A [id number] FCP (NOTE) SARS-CoV-2 target nucleic acids are DETECTED.  The SARS-CoV-2 RNA is generally detectable in upper respiratory specimens during the acute phase of infection. Positive results are indicative of the presence of the identified virus, but do not rule out bacterial infection or co-infection with other pathogens not detected by the test. Clinical correlation with patient history and other diagnostic information is necessary to determine patient infection status. The expected result is Negative.  Fact Sheet for Patients: BloggerCourse.com  Fact Sheet for Healthcare Providers: SeriousBroker.it  This test is not yet approved or cleared by the [place] FDA and  has been authorized for detection and/or diagnosis of SARS-CoV-2 by FDA under an Emergency Use Authorization (EUA).  This EUA will remain in effect (meaning this test can be used ) for the duration of  the COVID-19 declaration  under Section 564(b)(1) of the Act, 21 U.S.C. section 360bbb-3(b)(1), unless the authorization is terminated or revoked sooner.     Influenza A by PCR NEGATIVE NEGATIVE Final   Influenza B by PCR NEGATIVE NEGATIVE Final    Comment: (NOTE) The Xpert Xpress SARS-CoV-2/FLU/RSV plus assay is intended as an aid in the diagnosis of influenza from Nasopharyngeal swab specimens and should not be used as a sole basis for treatment. Nasal washings and aspirates are unacceptable for Xpert Xpress SARS-CoV-2/FLU/RSV testing.  Fact Sheet for Patients: BloggerCourse.com  Fact Sheet for Healthcare Providers: SeriousBroker.it  This test is not yet approved or cleared by the [place] FDA and has been authorized for detection and/or diagnosis of SARS-CoV-2 by FDA under an Emergency Use Authorization (EUA). This EUA will remain in effect (meaning this test can be used) for the duration of the COVID-19 declaration under Section 564(b)(1) of the Act, 21 U.S.C. section 360bbb-3(b)(1), unless the authorization is terminated or revoked.  Performed at [facility], [address] [place], [place] [address]   MRSA Next Gen by PCR, Nasal     Status: None   Collection Time: 07/28/21  6:28 AM   Specimen: Nasal Mucosa; Nasal Swab  Result Value Ref Range Status   MRSA by PCR Next Gen NOT DETECTED NOT DETECTED Final    Comment: (NOTE) The GeneXpert MRSA Assay (FDA approved for NASAL specimens only), is one component of a comprehensive MRSA colonization surveillance program. It is not intended to diagnose MRSA infection nor to guide or monitor treatment for MRSA infections. Test performance is not FDA approved in patients less than 70 years old. Performed at [facility], [address] [place], [place] [address]        Radiology Studies: Pelvis Portable  Result Date: 07/29/2021 CLINICAL DATA:  Right hip arthroplasty,  postop EXAM: PORTABLE PELVIS 1-2 VIEWS COMPARISON:  07/27/2021 FINDINGS: Frontal view of the pelvis includes both hips. There is a right hip arthroplasty in the expected position without signs of acute complication. Postsurgical changes in the overlying soft tissues. Stable left hip osteoarthritis. IMPRESSION: 1. Unremarkable right hip arthroplasty. Electronically Signed   By: Sharlet Salina M.D.   On: 07/29/2021 19:52   DG C-Arm 1-60 Min-No Report  Result Date: 07/29/2021 Fluoroscopy was utilized by the requesting physician.  No radiographic interpretation.   DG HIP OPERATIVE UNILAT W OR W/O PELVIS RIGHT  Result Date: 07/29/2021 CLINICAL DATA:  Right hip arthroplasty EXAM:  OPERATIVE right HIP (WITH PELVIS IF PERFORMED) 2 VIEWS TECHNIQUE: Fluoroscopic spot image(s) were submitted for interpretation post-operatively. COMPARISON:  07/27/2021 FINDINGS: Two low resolution intraoperative spot views of the right hip. Total fluoroscopy time was 9 seconds. The images demonstrate a right hip replacement with normal alignment IMPRESSION: Intraoperative fluoroscopic assistance provided during right hip arthroplasty Electronically Signed   By: Jasmine Pang M.D.   On: 07/29/2021 19:14    Scheduled Meds:  (feeding supplement) PROSource Plus  30 mL Oral BID BM   celecoxib  200 mg Oral BID   docusate sodium  100 mg Oral BID   enoxaparin (LOVENOX) injection  40 mg Subcutaneous Q24H   feeding supplement (GLUCERNA SHAKE)  237 mL Oral BID BM   insulin aspart  0-5 Units Subcutaneous QHS   insulin aspart  0-9 Units Subcutaneous TID WC   levETIRAcetam  500 mg Oral BID   multivitamin with minerals  1 tablet Oral Daily   senna  1 tablet Oral BID   senna-docusate  1 tablet Oral QHS   zinc sulfate  220 mg Oral Daily   Continuous Infusions:  sodium chloride 75 mL/hr at 07/30/21 0618   methocarbamol (ROBAXIN) IV       LOS: 3 days   Time spent: 26 minutes   Hughie Closs, MD [facility]  07/30/2021, 10:17  AM   How to contact the [facility] Attending or Consulting provider 7A - 7P or covering provider during after hours 7P -7A, for this patient?  Check the care team in [facility] and look for a) attending/consulting TRH provider listed and b) the [facility] team listed. Page or secure chat 7A-7P. Log into [email or web address] and use [place]'s universal password to access. If you do not have the password, please contact the hospital operator. Locate the [facility] provider you are looking for under [facility] and page to a number that you can be directly reached. If you still have difficulty reaching the provider, please page the [facility] (Director on Call) for the Hospitalists listed on amion for assistance.

## 2021-07-30 NOTE — Care Management Important Message (Signed)
Important Message  Patient Details IM Letter given to the Patient. Name: Sergio Dominguez MRN: [id number] Date of Birth: 1951-06-17   Medicare Important Message Given:  Yes     Caren Macadam 07/30/2021, 10:52 AM

## 2021-07-30 NOTE — TOC Progression Note (Signed)
Transition of Care [facility]) - Progression Note    Patient Details  Name: Sergio Dominguez MRN: [id number] Date of Birth: 09/16/1951  Transition of [facility]) CM/SW Contact  Darleene Cleaver, [place] Phone Number: 07/30/2021, 5:47 PM  Clinical Narrative:     CSW following patient's case.  Per patient and family they want to see how he does over the next couple of days, may need SNF, or maybe home health.  CSW to continue to follow patient's progress.        Expected Discharge Plan and Services                                                 Social Determinants of Health (SDOH) Interventions    Readmission Risk Interventions No flowsheet data found.

## 2021-07-30 NOTE — Progress Notes (Signed)
Patient originally tested positive for COVID on July 17th, 2022.  He states he was treated for COVID outpatient at a [facility] urgent care at that time.  Patient is asymptomatic at this time and has been since time of admission.  Discussed with Dr. Jacqulyn Bath and Boone Master, RN from Infection Prevention and all are in agreement that patient is able to come off of airborne/contact precautions at this time.  Will continue to monitor for any changes.

## 2021-07-30 NOTE — Progress Notes (Signed)
    Subjective:  Patient reports pain as mild.  Denies N/V/CP/SOB.   Objective:   VITALS:   Vitals:   07/29/21 2030 07/29/21 2052 07/30/21 0436 07/30/21 1225  BP: 127/78 113/79 93/72 96/69   Pulse: 96 (!) 102 87 92  Resp: 20 18 18 16   Temp:  98.2 F (36.8 C) 98.3 F (36.8 C) 98.3 F (36.8 C)  TempSrc:  Oral Oral Oral  SpO2: 96% 100% 98% 96%  Weight:      Height:        NAD ABD soft Neurovascular intact Sensation intact distally Intact pulses distally Dorsiflexion/Plantar flexion intact Incision: dressing C/D/I   Lab Results  Component Value Date   WBC 8.9 07/30/2021   HGB 12.3 (L) 07/30/2021   HCT 39.3 07/30/2021   MCV 92.9 07/30/2021   PLT 189 07/30/2021   BMET    Component Value Date/Time   NA 134 (L) 07/30/2021 0449   K 4.6 07/30/2021 0449   CL 104 07/30/2021 0449   CO2 25 07/30/2021 0449   GLUCOSE 197 (H) 07/30/2021 0449   BUN 13 07/30/2021 0449   CREATININE 0.89 07/30/2021 0449   CALCIUM 8.6 (L) 07/30/2021 0449   GFRNONAA >60 07/30/2021 0449   GFRAA >60 02/29/2020 0923     Assessment/Plan: 1 Day Post-Op   Active Problems:   Diabetes mellitus type 2 in nonobese (HCC)   HLD (hyperlipidemia)   Seizures (HCC)   Diabetic polyneuropathy associated with type 2 diabetes mellitus (HCC)   Right femoral fracture (HCC)   WBAT with walker DVT ppx: Aspirin, SCDs, TEDS PO pain control PT/OT Dispo: Clear for discharge from ortho perspective once cleared by PT/OT.  Follow up in 2 weeks with Dr.Swinteck for staple removal and radiographs     04/30/2020 07/30/2021, 1:32 PM  [facility] is now [facility] [address], [address], [place], [place] Phone: [phone] [email or web address] Facebook  [phone]

## 2021-07-30 NOTE — Evaluation (Signed)
Occupational Therapy Evaluation Patient Details Name: Sergio Dominguez MRN: [id number] DOB: 1951-09-28 Today's Date: 07/30/2021    History of Present Illness Sergio Dominguez is a 70 y.o. male with medical history significant for type 2 diabetes, seizure disorder(first diagnosed in December 2020, last seizure was in May 2022; has had 3 seizures in his lifetime, including in March 2021) who presented to [facility] ED after a mechanical fall at home. Tested positive for COVID July 17 per his report. s/p R THA anterior approach.   Clinical Impression   Sergio Dominguez is a 70 year old man who presents s/p hip surgery with decreased ROM and strength in RLE and with pain resulting in a sudden decline in functional abilities. Patient requiring min assist for bed transfer, min guard for steps to recliner, and overall setup to mod assist for ADLs. Patient predominantly limited by pain. Patient will benefit from skilled OT services while in hospital to improve deficits and learn compensatory strategies as needed in order to return to PLOF.  Patient wanting to go home at discharge.     Follow Up Recommendations  Home health OT    Equipment Recommendations  None recommended by OT    Recommendations for Other Services       Precautions / Restrictions Precautions Precautions: Fall;Anterior Hip Restrictions Weight Bearing Restrictions: No      Mobility Bed Mobility Overal bed mobility: Needs Assistance Bed Mobility: Supine to Sit     Supine to sit: Min assist          Transfers Overall transfer level: Needs assistance Equipment used: Rolling walker (2 wheeled) Transfers: Sit to/from [employer] Sit to Stand: Min guard;From elevated surface Stand pivot transfers: Min guard            Balance Overall balance assessment: Needs assistance Sitting-balance support: No upper extremity supported Sitting balance-Leahy Scale: Good     Standing balance support:  During functional activity Standing balance-Leahy Scale: Fair                             ADL either performed or assessed with clinical judgement   ADL Overall ADL's : Needs assistance/impaired Eating/Feeding: Independent   Grooming: Set up;Sitting   Upper Body Bathing: Set up;Sitting   Lower Body Bathing: Minimal assistance;Sit to/from stand   Upper Body Dressing : Set up;Sitting   Lower Body Dressing: Sit to/from stand;Moderate assistance   Toilet Transfer: Min Archivist and Hygiene: Min guard;Sit to/from stand       Functional mobility during ADLs: Min guard;Rolling walker       Vision Patient Visual Report: No change from baseline       Perception     Praxis      Pertinent Vitals/Pain Pain Assessment: Faces Faces Pain Scale: Hurts little more Pain Location: r hip Pain Descriptors / Indicators: Sore;Aching Pain Intervention(s): Limited activity within patient's tolerance;Monitored during session     Hand Dominance     Extremity/Trunk Assessment Upper Extremity Assessment Upper Extremity Assessment: Overall WFL for tasks assessed   Lower Extremity Assessment Lower Extremity Assessment: Defer to PT evaluation   Cervical / Trunk Assessment Cervical / Trunk Assessment: Normal   Communication Communication Communication: No difficulties   Cognition Arousal/Alertness: Awake/alert Behavior During Therapy: WFL for tasks assessed/performed Overall Cognitive Status: Within Functional Limits for tasks assessed  General Comments       Exercises     Shoulder Instructions      Home Living Family/patient expects to be discharged to:: Private residence Living Arrangements: Spouse/significant other Available Help at Discharge: Family;Available 24 hours/day Type of Home: House Home Access: Stairs to enter [employer] of Steps: 3   Home  Layout: Two level;Able to live on main level with bedroom/bathroom   Alternate Level Stairs-Rails: Right Bathroom Shower/Tub: Tub/shower unit;Walk-in shower   Bathroom Toilet: Handicapped height (downstairs)     Home Equipment: Environmental consultant - 2 wheels;Bedside commode          Prior Functioning/Environment Level of Independence: Independent                 OT Problem List: Decreased strength;Decreased range of motion;Impaired balance (sitting and/or standing);Decreased activity tolerance;Pain;Decreased knowledge of use of DME or AE      OT Treatment/Interventions: Self-care/ADL training;Therapeutic exercise;DME and/or AE instruction;Balance training;Patient/family education    OT Goals(Current goals can be found in the care plan section) Acute Rehab OT Goals Patient Stated Goal: to go home OT Goal Formulation: With patient Time For Goal Achievement: 08/13/21 Potential to Achieve Goals: Good  OT Frequency: Min 2X/week   Barriers to D/C:            Co-evaluation              AM-PAC OT "6 Clicks" Daily Activity     Outcome Measure Help from another person eating meals?: None Help from another person taking care of personal grooming?: A Little Help from another person toileting, which includes using toliet, bedpan, or urinal?: A Little Help from another person bathing (including washing, rinsing, drying)?: A Little Help from another person to put on and taking off regular upper body clothing?: A Little Help from another person to put on and taking off regular lower body clothing?: A Lot 6 Click Score: 18   End of Session Equipment Utilized During Treatment: Rolling walker;Gait belt Nurse Communication: Mobility status  Activity Tolerance: Patient tolerated treatment well Patient left: in chair;with call bell/phone within reach;with chair alarm set  OT Visit Diagnosis: Other abnormalities of gait and mobility (R26.89);Pain                Time: 3825-0539 OT Time  Calculation (min): 15 min Charges:  OT General Charges $OT Visit: 1 Visit OT Evaluation $OT Eval Low Complexity: 1 Low  Bharat Antillon, OTR/L Acute Care Rehab Services  Office [phone] Pager: [phone]   Kelli Churn 07/30/2021, 8:22 AM

## 2021-07-30 NOTE — Evaluation (Signed)
Physical Therapy Evaluation Patient Details Name: Sergio Dominguez MRN: [id number] DOB: [birth year] Today's Date: 07/30/2021   History of Present Illness  Sergio Dominguez is a 70 y.o. male with medical history significant for type 2 diabetes, seizure disorder(first diagnosed in December 2020, last seizure was in May 2022; has had 3 seizures in his lifetime, including in March 2021) who presented to [facility] ED after a mechanical fall at home. Tested positive for COVID July 17 per his report. s/p R THA anterior approach.Patient will come off of  airborne precautions.  Clinical Impression  The patient is very eager to begin mobility. Patient did require assistance to move the right  leg to bed edge. Patient took a few steps to recliner, not ready to ambulate. Patient noted to be PWB with decreased support on right leg.  Patient  desires to return home, patient may benefit from CIR to improve functional independence. Pt admitted with above diagnosis.  Pt currently with functional limitations due to the deficits listed below (see PT Problem List). Pt will benefit from skilled PT to increase their independence and safety with mobility to allow discharge to the venue listed below.       Follow Up Recommendations Home health PT vs CIR    Equipment Recommendations  None recommended by PT    Recommendations for Other Services       Precautions / Restrictions Precautions Precautions: Fall Restrictions Weight Bearing Restrictions: No Other Position/Activity Restrictions: WBAT      Mobility  Bed Mobility   Bed Mobility: Supine to Sit     Supine to sit: Min assist     General bed mobility comments: assist with right leg, cues for technique    Transfers Overall transfer level: Needs assistance Equipment used: Rolling walker (2 wheeled) Transfers: Sit to/from [employer] Transfers Sit to Stand: From elevated surface;Min assist Stand pivot transfers: Min assist        General transfer comment: antalgic, decreased WB on right .  Ambulation/Gait Ambulation/Gait assistance: Min assist   Assistive device: Rolling walker (2 wheeled) Gait Pattern/deviations: Step-to pattern     General Gait Details: steps to recliner.  Stairs            Wheelchair Mobility    Modified Rankin (Stroke Patients Only)       Balance   Sitting-balance support: No upper extremity supported Sitting balance-Leahy Scale: Good     Standing balance support: During functional activity Standing balance-Leahy Scale: Fair Standing balance comment: reliant on RW                             Pertinent Vitals/Pain Faces Pain Scale: Hurts little more Pain Location: r hip Pain Descriptors / Indicators: Sore;Aching Pain Intervention(s): Limited activity within patient's tolerance;Monitored during session;RN gave pain meds during session    Home Living         Home Access: Stairs to enter Entrance Stairs-Rails: Right   Home Layout: Able to live on main level with bedroom/bathroom Home Equipment: Walker - 2 wheels;Bedside commode      Prior Function Level of Independence: Independent               Hand Dominance   Dominant Hand: Left    Extremity/Trunk Assessment   Upper Extremity Assessment Upper Extremity Assessment: Overall WFL for tasks assessed    Lower Extremity Assessment Lower Extremity Assessment: RLE deficits/detail RLE Deficits / Details: requires assistance to move leg  to bed edge    Cervical / Trunk Assessment Cervical / Trunk Assessment: Normal  Communication   Communication: No difficulties  Cognition Arousal/Alertness: Awake/alert Behavior During Therapy: WFL for tasks assessed/performed Overall Cognitive Status: Within Functional Limits for tasks assessed                                        General Comments      Exercises     Assessment/Plan    PT Assessment Patient needs continued  PT services  PT Problem List Decreased strength;Decreased balance;Decreased knowledge of precautions;Decreased range of motion;Decreased mobility;Decreased knowledge of use of DME;Decreased activity tolerance;Pain;Decreased safety awareness       PT Treatment Interventions DME instruction;Therapeutic activities;Gait training;Therapeutic exercise;Patient/family education;Stair training;Functional mobility training    PT Goals (Current goals can be found in the Care Plan section)  Acute Rehab PT Goals Patient Stated Goal: to go home PT Goal Formulation: With patient Time For Goal Achievement: 08/13/21 Potential to Achieve Goals: Good    Frequency Min 5X/week   Barriers to discharge        Co-evaluation               AM-PAC PT "6 Clicks" Mobility  Outcome Measure Help needed turning from your back to your side while in a flat bed without using bedrails?: A Little Help needed moving from lying on your back to sitting on the side of a flat bed without using bedrails?: A Little Help needed moving to and from a bed to a chair (including a wheelchair)?: A Little Help needed standing up from a chair using your arms (e.g., wheelchair or bedside chair)?: A Little Help needed to walk in hospital room?: A Lot Help needed climbing 3-5 steps with a railing? : A Lot 6 Click Score: 16    End of Session Equipment Utilized During Treatment: Gait belt Activity Tolerance: Patient tolerated treatment well Patient left: in chair;with call bell/phone within reach;with chair alarm set;with nursing/sitter in room Nurse Communication: Mobility status PT Visit Diagnosis: Unsteadiness on feet (R26.81);Difficulty in walking, not elsewhere classified (R26.2)    Time: 5102-5852 PT Time Calculation (min) (ACUTE ONLY): 28 min   Charges:   PT Evaluation $PT Eval Low Complexity: 1 Low          Blanchard Kelch PT Acute Rehabilitation Services Pager [phone] Office [phone]   Rada Hay 07/30/2021, 1:20 PM

## 2021-07-30 NOTE — Progress Notes (Signed)
Inpatient Rehabilitation Admissions Coordinator   I spoke with patient and his wife by phone. They prefer to see how he is doing over the next 24 to 48 hrs with therapy and prefer direct d/c home with [facility] or OP therapy. I explained that it is unlikely that [facility] will approve a CIR admit for this diagnosis. If he fails to progress to d/c home, he will need short term SNF.   Ottie Glazier, RN, MSN Rehab Admissions Coordinator [phone] 07/30/2021 3:09 PM

## 2021-07-31 DIAGNOSIS — D62 Acute posthemorrhagic anemia: Secondary | ICD-10-CM

## 2021-07-31 LAB — CBC
HCT: 32.2 % — ABNORMAL LOW (ref 39.0–52.0)
Hemoglobin: 10.4 g/dL — ABNORMAL LOW (ref 13.0–17.0)
MCH: 29.1 pg (ref 26.0–34.0)
MCHC: 32.3 g/dL (ref 30.0–36.0)
MCV: 90.2 fL (ref 80.0–100.0)
Platelets: 186 10*3/uL (ref 150–400)
RBC: 3.57 MIL/uL — ABNORMAL LOW (ref 4.22–5.81)
RDW: 13 % (ref 11.5–15.5)
WBC: 9 10*3/uL (ref 4.0–10.5)
nRBC: 0 % (ref 0.0–0.2)

## 2021-07-31 LAB — BASIC METABOLIC PANEL
Anion gap: 5 (ref 5–15)
BUN: 15 mg/dL (ref 8–23)
CO2: 27 mmol/L (ref 22–32)
Calcium: 8.3 mg/dL — ABNORMAL LOW (ref 8.9–10.3)
Chloride: 104 mmol/L (ref 98–111)
Creatinine, Ser: 0.73 mg/dL (ref 0.61–1.24)
GFR, Estimated: >60 mL/min (ref >60)
Glucose, Bld: 137 mg/dL — ABNORMAL HIGH (ref 70–99)
Potassium: 3.8 mmol/L (ref 3.5–5.1)
Sodium: 136 mmol/L (ref 135–145)

## 2021-07-31 LAB — GLUCOSE, CAPILLARY
Glucose-Capillary: 119 mg/dL — ABNORMAL HIGH (ref 70–99)
Glucose-Capillary: 179 mg/dL — ABNORMAL HIGH (ref 70–99)
Glucose-Capillary: 153 mg/dL — ABNORMAL HIGH (ref 70–99)

## 2021-07-31 NOTE — Discharge Summary (Signed)
Physician Discharge Summary  Sergio Dominguez QPR:[id number] DOB: 09-10-1951 DOA: 07/27/2021  PCP: Tracey Harries, MD  Admit date: 07/27/2021 Discharge date: 07/31/2021 30 Day Unplanned Readmission Risk Score    Flowsheet Row ED to Hosp-Admission (Current) from 07/27/2021 in [facility]TH FLOOR PROGRESSIVE CARE AND UROLOGY  30 Day Unplanned Readmission Risk Score (%) 9.49 Filed at 07/31/2021 0800       This score is the patient's risk of an unplanned readmission within 30 days of being discharged (0 -100%). The score is based on dignosis, age, lab data, medications, orders, and past utilization.   Low:  0-14.9   Medium: 15-21.9   High: 22-29.9   Extreme: 30 and above          Admitted From: Home Disposition: Home  Recommendations for Outpatient Follow-up:  Follow up with PCP in 1-2 weeks Follow-up with orthopedics in 2 weeks Please obtain BMP/CBC in one week Please follow up with your PCP on the following pending results: Unresulted Labs (From admission, onward)     Start     Ordered   08/05/21 0500  Creatinine, serum  (enoxaparin (LOVENOX)  CrCl >/= 30 mL/min  )  Weekly,   R     Comments: while on enoxaparin therapy.    07/29/21 2102   07/30/21 0500  CBC  Daily,   R      07/29/21 2102              Home Health: Yes Equipment/Devices: None  Discharge Condition: Stable CODE STATUS: Full code Diet recommendation: Cardiac  Subjective: Seen and examined.  Complains of right hip pain but that is improving.  No other complaint.  Brief/Interim Summary: Sergio Dominguez is a 70 y.o. male with medical history significant for type 2 diabetes, seizure disorder(first diagnosed in December 2020, last seizure was in May 2022; has had 3 seizures in his lifetime, including in March 2021) who presented to [facility] ED after a mechanical fall at home. right hip x-ray, revealed mildly displaced right femoral neck fracture.  Patient was hemodynamically stable in the ED however he was  tested positive for COVID-19.  He was admitted under hospitalist service and orthopedics consulted.  He eventually underwent right total hip arthroplasty on 07/29/2021.  Did have some postoperative anemia, hemoglobin dropped from 15-10.4 but did not require any blood transfusion.  No active bleeding at the site of incision.  Was cleared by orthopedic for discharge.  They have prescribed him pain medications and recommended aspirin 81 mg twice daily for DVT prophylaxis.  They will see him in 2 weeks.  He was assessed by PT OT who recommended either home health or CIR.  Patient was assessed by CIR as well however patient declined to go there and instead he wanted to go home.  Patient is doing well and is stable so he will be discharged home today.  COVID-positive: Patient initially tested positive on 07/11/2021 and was tested with 5 days of paxlovid.  Currently has no symptoms.   Discharge Diagnoses:  Active Problems:   Diabetes mellitus type 2 in nonobese (HCC)   HLD (hyperlipidemia)   Seizures (HCC)   Diabetic polyneuropathy associated with type 2 diabetes mellitus (HCC)   Right femoral fracture (HCC)    Discharge Instructions   Allergies as of 07/31/2021       Reactions   Oxycodone Hcl    REACTION: NAUSEA/VOMITNG        Medication List     TAKE these medications  aspirin 81 MG chewable tablet Commonly known as: Aspirin Childrens Chew 1 tablet (81 mg total) by mouth 2 (two) times daily with a meal.   [employer] CALCIUM-600/VIT D PO Take by mouth.   HYDROcodone-acetaminophen 5-325 MG tablet Commonly known as: NORCO/VICODIN Take 1-2 tablets by mouth every 4 (four) hours as needed for moderate pain (pain score 4-6).   levETIRAcetam 500 MG tablet Commonly known as: KEPPRA Take 1 tablet (500 mg total) by mouth 2 (two) times daily.   metFORMIN 500 MG tablet Commonly known as: GLUCOPHAGE Take 500 mg by mouth 2 (two) times daily with a meal.   zinc gluconate 50 MG tablet Take 50 mg  by mouth daily.        Follow-up Information     Swinteck, Arlys John, MD. Schedule an appointment as soon as possible for a visit in 2 week(s).   Specialty: Orthopedic Surgery Why: For suture removal Contact information: [address] [place] [address] [phone]         Tracey Harries, MD Follow up in 1 week(s).   Specialty: Family Medicine Contact information: [address] [place] [address] [phone]                Allergies  Allergen Reactions   Oxycodone Hcl     REACTION: NAUSEA/VOMITNG    Consultations: Orthopedic   Procedures/Studies: Pelvis Portable  Result Date: 07/29/2021 CLINICAL DATA:  Right hip arthroplasty, postop EXAM: PORTABLE PELVIS 1-2 VIEWS COMPARISON:  07/27/2021 FINDINGS: Frontal view of the pelvis includes both hips. There is a right hip arthroplasty in the expected position without signs of acute complication. Postsurgical changes in the overlying soft tissues. Stable left hip osteoarthritis. IMPRESSION: 1. Unremarkable right hip arthroplasty. Electronically Signed   By: Sharlet Salina M.D.   On: 07/29/2021 19:52   DG Chest Port 1 View  Result Date: 07/27/2021 CLINICAL DATA:  Preoperative respiratory exam.  Hip fracture. EXAM: PORTABLE CHEST 1 VIEW COMPARISON:  12/17/2019 FINDINGS: Artifact overlies the chest. Heart size is normal. Mediastinal shadows are normal. The lungs are clear. The vascularity is normal. No effusions. IMPRESSION: No active disease. Electronically Signed   By: Paulina Fusi M.D.   On: 07/27/2021 19:03   DG Knee Right Port  Result Date: 07/27/2021 CLINICAL DATA:  Closed right hip fracture.  Fall today. EXAM: PORTABLE RIGHT KNEE - 1-2 VIEW COMPARISON:  None. FINDINGS: No evidence of fracture, dislocation, or joint effusion. Minimal peripheral degenerative spurring. Quadriceps and patellar tendon enthesophyte. Soft tissues are unremarkable. IMPRESSION: No fracture or subluxation of  the right knee. Electronically Signed   By: Narda Rutherford M.D.   On: 07/27/2021 19:28   DG C-Arm 1-60 Min-No Report  Result Date: 07/29/2021 Fluoroscopy was utilized by the requesting physician.  No radiographic interpretation.   DG HIP OPERATIVE UNILAT W OR W/O PELVIS RIGHT  Result Date: 07/29/2021 CLINICAL DATA:  Right hip arthroplasty EXAM: OPERATIVE right HIP (WITH PELVIS IF PERFORMED) 2 VIEWS TECHNIQUE: Fluoroscopic spot image(s) were submitted for interpretation post-operatively. COMPARISON:  07/27/2021 FINDINGS: Two low resolution intraoperative spot views of the right hip. Total fluoroscopy time was 9 seconds. The images demonstrate a right hip replacement with normal alignment IMPRESSION: Intraoperative fluoroscopic assistance provided during right hip arthroplasty Electronically Signed   By: Jasmine Pang M.D.   On: 07/29/2021 19:14   DG Hip Unilat W or Wo Pelvis 2-3 Views Right  Result Date: 07/27/2021 CLINICAL DATA:  Pain after fall.  Ground level mechanical  fall. EXAM: DG HIP (WITH OR WITHOUT PELVIS) 2-3V RIGHT COMPARISON:  None. FINDINGS: Right cervical neck fracture with mild displacement. Mild superior migration of the femoral shaft. Femoral head remains seated. Mild underlying osteoarthritis. Pubic rami are intact. Remainder of the bony pelvis is intact. Pubic symphysis and sacroiliac joints are congruent. IMPRESSION: Mildly displaced right femoral neck fracture. Electronically Signed   By: Narda RutherfordMelanie  Sanford M.D.   On: 07/27/2021 18:29     Discharge Exam: Vitals:   07/30/21 2038 07/31/21 0533  BP: 106/67 (!) 97/59  Pulse: 99 83  Resp: 18 18  Temp: (!) 100.6 F (38.1 C) 98.1 F (36.7 C)  SpO2: 94% 94%   Vitals:   07/30/21 0436 07/30/21 1225 07/30/21 2038 07/31/21 0533  BP: 93/72 96/69 106/67 (!) 97/59  Pulse: 87 92 99 83  Resp: 18 16 18 18   Temp: 98.3 F (36.8 C) 98.3 F (36.8 C) (!) 100.6 F (38.1 C) 98.1 F (36.7 C)  TempSrc: Oral Oral    SpO2: 98% 96% 94% 94%   Weight:      Height:        General: Pt is alert, awake, not in acute distress Cardiovascular: RRR, S1/S2 +, no rubs, no gallops Respiratory: CTA bilaterally, no wheezing, no rhonchi Abdominal: Soft, NT, ND, bowel sounds + Extremities: no edema, no cyanosis    The results of significant diagnostics from this hospitalization (including imaging, microbiology, ancillary and laboratory) are listed below for reference.     Microbiology: Recent Results (from the past 240 hour(s))  Resp Panel by RT-PCR (Flu A&B, Covid) Nasopharyngeal Swab     Status: Abnormal   Collection Time: 07/27/21  6:42 PM   Specimen: Nasopharyngeal Swab; Nasopharyngeal(NP) swabs in vial transport medium  Result Value Ref Range Status   SARS Coronavirus 2 by RT PCR POSITIVE (A) NEGATIVE Final    Comment: RESULT CALLED TO, READ BACK BY AND VERIFIED WITH: RN PAULINA A 2121 [id number] FCP (NOTE) SARS-CoV-2 target nucleic acids are DETECTED.  The SARS-CoV-2 RNA is generally detectable in upper respiratory specimens during the acute phase of infection. Positive results are indicative of the presence of the identified virus, but do not rule out bacterial infection or co-infection with other pathogens not detected by the test. Clinical correlation with patient history and other diagnostic information is necessary to determine patient infection status. The expected result is Negative.  Fact Sheet for Patients: BloggerCourse.comhttps://www.fda.gov/media/152166/download  Fact Sheet for Healthcare Providers: SeriousBroker.ithttps://www.fda.gov/media/152162/download  This test is not yet approved or cleared by the [place] FDA and  has been authorized for detection and/or diagnosis of SARS-CoV-2 by FDA under an Emergency Use Authorization (EUA).  This EUA will remain in effect (meaning this test can be used ) for the duration of  the COVID-19 declaration under Section 564(b)(1) of the Act, 21 U.S.C. section 360bbb-3(b)(1), unless the  authorization is terminated or revoked sooner.     Influenza A by PCR NEGATIVE NEGATIVE Final   Influenza B by PCR NEGATIVE NEGATIVE Final    Comment: (NOTE) The Xpert Xpress SARS-CoV-2/FLU/RSV plus assay is intended as an aid in the diagnosis of influenza from Nasopharyngeal swab specimens and should not be used as a sole basis for treatment. Nasal washings and aspirates are unacceptable for Xpert Xpress SARS-CoV-2/FLU/RSV testing.  Fact Sheet for Patients: BloggerCourse.comhttps://www.fda.gov/media/152166/download  Fact Sheet for Healthcare Providers: SeriousBroker.ithttps://www.fda.gov/media/152162/download  This test is not yet approved or cleared by the [place] FDA and has been authorized for detection and/or diagnosis of SARS-CoV-2  by FDA under an Emergency Use Authorization (EUA). This EUA will remain in effect (meaning this test can be used) for the duration of the COVID-19 declaration under Section 564(b)(1) of the Act, 21 U.S.C. section 360bbb-3(b)(1), unless the authorization is terminated or revoked.  Performed at [facility], [address] [place], [place] [address]   MRSA Next Gen by PCR, Nasal     Status: None   Collection Time: 07/28/21  6:28 AM   Specimen: Nasal Mucosa; Nasal Swab  Result Value Ref Range Status   MRSA by PCR Next Gen NOT DETECTED NOT DETECTED Final    Comment: (NOTE) The GeneXpert MRSA Assay (FDA approved for NASAL specimens only), is one component of a comprehensive MRSA colonization surveillance program. It is not intended to diagnose MRSA infection nor to guide or monitor treatment for MRSA infections. Test performance is not FDA approved in patients less than 16 years old. Performed at [facility], [address], [place], [place] [address]      Labs: BNP (last 3 results) No results for input(s): BNP in the last 8760 hours. Basic Metabolic Panel: Recent Labs  Lab 07/27/21 1836 07/28/21 0439 07/29/21 0507 07/30/21 0449  07/31/21 0525  NA 135 135 136 134* 136  K 4.3 4.0 4.0 4.6 3.8  CL 102 102 103 104 104  CO2 GLUCOSE 165* 136* 138* 197* 137*  BUN CREATININE 0.83 0.70 0.72 0.89 0.73  CALCIUM 9.5 8.9 8.5* 8.6* 8.3*  MG  --  2.0  --   --   --   PHOS  --  3.1  --   --   --    Liver Function Tests: No results for input(s): AST, ALT, ALKPHOS, BILITOT, PROT, ALBUMIN in the last 168 hours. No results for input(s): LIPASE, AMYLASE in the last 168 hours. No results for input(s): AMMONIA in the last 168 hours. CBC: Recent Labs  Lab 07/27/21 1836 07/28/21 0439 07/29/21 0507 07/30/21 0449 07/31/21 0525  WBC 9.1 7.9 6.8 8.9 9.0  NEUTROABS 7.5  --   --   --   --   HGB 15.0 13.5 12.5* 12.3* 10.4*  HCT 47.3 43.0 39.9 39.3 32.2*  MCV 92.6 92.5 93.0 92.9 90.2  PLT 256 192 182 189 186   Cardiac Enzymes: No results for input(s): CKTOTAL, CKMB, CKMBINDEX, TROPONINI in the last 168 hours. BNP: Invalid input(s): POCBNP CBG: Recent Labs  Lab 07/30/21 0738 07/30/21 1224 07/30/21 1659 07/30/21 2232 07/31/21 0747  GLUCAP 147* 163* 181* 244* 119*   D-Dimer No results for input(s): DDIMER in the last 72 hours. Hgb A1c No results for input(s): HGBA1C in the last 72 hours. Lipid Profile No results for input(s): CHOL, HDL, LDLCALC, TRIG, CHOLHDL, LDLDIRECT in the last 72 hours. Thyroid function studies No results for input(s): TSH, T4TOTAL, T3FREE, THYROIDAB in the last 72 hours.  Invalid input(s): FREET3 Anemia work up No results for input(s): VITAMINB12, FOLATE, FERRITIN, TIBC, IRON, RETICCTPCT in the last 72 hours. Urinalysis    Component Value Date/Time   COLORURINE YELLOW 02/29/2020 0926   APPEARANCEUR CLEAR 02/29/2020 0926   LABSPEC 1.018 02/29/2020 0926   PHURINE 5.0 02/29/2020 0926   GLUCOSEU 150 (A) 02/29/2020 0926   HGBUR SMALL (A) 02/29/2020 0926   BILIRUBINUR NEGATIVE 02/29/2020 0926   KETONESUR NEGATIVE 02/29/2020 0926   PROTEINUR 30 (A) 02/29/2020  0926   NITRITE NEGATIVE 02/29/2020 0926   LEUKOCYTESUR  NEGATIVE 02/29/2020 0926   Sepsis Labs Invalid input(s): PROCALCITONIN,  WBC,  LACTICIDVEN Microbiology Recent Results (from the past 240 hour(s))  Resp Panel by RT-PCR (Flu A&B, Covid) Nasopharyngeal Swab     Status: Abnormal   Collection Time: 07/27/21  6:42 PM   Specimen: Nasopharyngeal Swab; Nasopharyngeal(NP) swabs in vial transport medium  Result Value Ref Range Status   SARS Coronavirus 2 by RT PCR POSITIVE (A) NEGATIVE Final    Comment: RESULT CALLED TO, READ BACK BY AND VERIFIED WITH: RN PAULINA A 2121 [id number] FCP (NOTE) SARS-CoV-2 target nucleic acids are DETECTED.  The SARS-CoV-2 RNA is generally detectable in upper respiratory specimens during the acute phase of infection. Positive results are indicative of the presence of the identified virus, but do not rule out bacterial infection or co-infection with other pathogens not detected by the test. Clinical correlation with patient history and other diagnostic information is necessary to determine patient infection status. The expected result is Negative.  Fact Sheet for Patients: BloggerCourse.com  Fact Sheet for Healthcare Providers: SeriousBroker.it  This test is not yet approved or cleared by the [place] FDA and  has been authorized for detection and/or diagnosis of SARS-CoV-2 by FDA under an Emergency Use Authorization (EUA).  This EUA will remain in effect (meaning this test can be used ) for the duration of  the COVID-19 declaration under Section 564(b)(1) of the Act, 21 U.S.C. section 360bbb-3(b)(1), unless the authorization is terminated or revoked sooner.     Influenza A by PCR NEGATIVE NEGATIVE Final   Influenza B by PCR NEGATIVE NEGATIVE Final    Comment: (NOTE) The Xpert Xpress SARS-CoV-2/FLU/RSV plus assay is intended as an aid in the diagnosis of influenza from Nasopharyngeal swab specimens  and should not be used as a sole basis for treatment. Nasal washings and aspirates are unacceptable for Xpert Xpress SARS-CoV-2/FLU/RSV testing.  Fact Sheet for Patients: BloggerCourse.com  Fact Sheet for Healthcare Providers: SeriousBroker.it  This test is not yet approved or cleared by the [place] FDA and has been authorized for detection and/or diagnosis of SARS-CoV-2 by FDA under an Emergency Use Authorization (EUA). This EUA will remain in effect (meaning this test can be used) for the duration of the COVID-19 declaration under Section 564(b)(1) of the Act, 21 U.S.C. section 360bbb-3(b)(1), unless the authorization is terminated or revoked.  Performed at [facility], [address], [place], [place] [address]   MRSA Next Gen by PCR, Nasal     Status: None   Collection Time: 07/28/21  6:28 AM   Specimen: Nasal Mucosa; Nasal Swab  Result Value Ref Range Status   MRSA by PCR Next Gen NOT DETECTED NOT DETECTED Final    Comment: (NOTE) The GeneXpert MRSA Assay (FDA approved for NASAL specimens only), is one component of a comprehensive MRSA colonization surveillance program. It is not intended to diagnose MRSA infection nor to guide or monitor treatment for MRSA infections. Test performance is not FDA approved in patients less than 62 years old. Performed at [facility], [address], [place], [place] [address]      Time coordinating discharge: Over 30 minutes  SIGNED:   Hughie Closs, MD  [facility] 07/31/2021, 8:49 AM  If 7PM-7AM, please contact night-coverage [email or web address]

## 2021-07-31 NOTE — Progress Notes (Signed)
Physical Therapy Treatment Patient Details Name: Sergio Dominguez MRN: [id number] DOB: January 23, 1951 Today's Date: 07/31/2021    History of Present Illness Sergio Dominguez is a 70 y.o. male with medical history significant for type 2 diabetes, seizure disorder(first diagnosed in December 2020, last seizure was in May 2022; has had 3 seizures in his lifetime, including in March 2021) who presented to [facility] ED after a mechanical fall at home. Tested positive for COVID July 17 per his report. s/p R THA anterior approach.    PT Comments    Pt assisted with ambulating however only tolerated short distance.  Pt premedicated for session.  Pt reports he has a steep driveway so he plans to walk across his neighbors to get into his home which is farther but no sloped.  Pt also has at least 3 steps to enter home.  Pt felt unable to attempt steps this morning so will return this afternoon for safe stair training prior to d/c.    Follow Up Recommendations  Home health PT;Supervision for mobility/OOB     Equipment Recommendations  Other (comment) (pt requesting shower chair for walk-in shower)    Recommendations for Other Services       Precautions / Restrictions Precautions Precautions: Fall Restrictions Weight Bearing Restrictions: No Other Position/Activity Restrictions: WBAT    Mobility  Bed Mobility Overal bed mobility: Needs Assistance Bed Mobility: Supine to Sit     Supine to sit: Min guard;HOB elevated     General bed mobility comments: cues for self assist with right leg    Transfers Overall transfer level: Needs assistance Equipment used: Rolling walker (2 wheeled) Transfers: Sit to/from Stand Sit to Stand: Min guard;From elevated surface         General transfer comment: verbal cues for UE and LE positioning  Ambulation/Gait Ambulation/Gait assistance: Min guard Gait Distance (Feet): 50 Feet Assistive device: Rolling walker (2 wheeled) Gait  Pattern/deviations: Step-to pattern;Antalgic;Decreased stance time - right Gait velocity: decr   General Gait Details: verbal cues for sequence, RW positioning, step length, distance to tolerance   Stairs             Wheelchair Mobility    Modified Rankin (Stroke Patients Only)       Balance Overall balance assessment: Needs assistance         Standing balance support: Bilateral upper extremity supported Standing balance-Leahy Scale: Poor Standing balance comment: reliant on UE support                            Cognition Arousal/Alertness: Awake/alert Behavior During Therapy: WFL for tasks assessed/performed Overall Cognitive Status: Within Functional Limits for tasks assessed                                        Exercises      General Comments        Pertinent Vitals/Pain Pain Assessment: Faces Faces Pain Scale: Hurts little more Pain Location: right hip Pain Descriptors / Indicators: Sore;Aching Pain Intervention(s): Repositioned;Premedicated before session;Monitored during session    Home Living                      Prior Function            PT Goals (current goals can now be found in the care plan section) Progress towards PT  goals: Progressing toward goals    Frequency    Min 5X/week      PT Plan Current plan remains appropriate    Co-evaluation              AM-PAC PT "6 Clicks" Mobility   Outcome Measure  Help needed turning from your back to your side while in a flat bed without using bedrails?: A Little Help needed moving from lying on your back to sitting on the side of a flat bed without using bedrails?: A Little Help needed moving to and from a bed to a chair (including a wheelchair)?: A Little Help needed standing up from a chair using your arms (e.g., wheelchair or bedside chair)?: A Little Help needed to walk in hospital room?: A Little Help needed climbing 3-5 steps with a  railing? : A Lot 6 Click Score: 17    End of Session Equipment Utilized During Treatment: Gait belt Activity Tolerance: Patient tolerated treatment well Patient left: in chair;with call bell/phone within reach;with chair alarm set Nurse Communication: Mobility status PT Visit Diagnosis: Unsteadiness on feet (R26.81);Difficulty in walking, not elsewhere classified (R26.2)     Time: 1040-1055 PT Time Calculation (min) (ACUTE ONLY): 15 min  Charges:  $Gait Training: 8-22 mins                     Paulino Door, DPT Acute Rehabilitation Services Pager: [phone] Office: [phone]  Arrie Zuercher,KATHrine E 07/31/2021, 11:10 AM

## 2021-07-31 NOTE — TOC Transition Note (Addendum)
Transition of Care [facility]) - CM/SW Discharge Note   Patient Details  Name: Sergio Dominguez MRN: [id number] Date of Birth: Jun 18, 1951  [facility]) CM/SW Contact:  Golda Acre, RN Phone Number: 07/31/2021, 9:04 AM   Clinical Narrative:    [facility] pt and ot for [facility] at [phone].  Shower stool ordered for home care through [facility].        Patient Goals and CMS Choice        Discharge Placement                       Discharge Plan and Services   Discharge Planning Services: CM Consult                      HH Arranged: PT, OT HH Agency: [facility] Date [facility] Agency Contacted: 07/31/21 Time HH Agency Contacted: [phone] Representative spoke with at [facility] Agency: mike  Social Determinants of Health (SDOH) Interventions     Readmission Risk Interventions No flowsheet data found.

## 2021-07-31 NOTE — Progress Notes (Signed)
Physical Therapy Treatment Patient Details Name: Sergio Dominguez MRN: [id number] DOB: 05/30/1951 Today's Date: 07/31/2021    History of Present Illness Khadeem Rockett is a 70 y.o. male with medical history significant for type 2 diabetes, seizure disorder(first diagnosed in December 2020, last seizure was in May 2022; has had 3 seizures in his lifetime, including in March 2021) who presented to [facility] ED after a mechanical fall at home. Tested positive for COVID July 17 per his report. s/p R THA anterior approach.    PT Comments    Pt overall min/guard with mobility this afternoon and pleased with progress.   Pt ambulated improved distance and practiced safe stair technique.  Pt feels more comfortable with d/c home today.  Pt provided with gait belt to self assist LE and another for waist gait belt use with mobility for safety.  Pt feels ready for d/c home today and had no further questions.    Follow Up Recommendations  Home health PT;Supervision for mobility/OOB     Equipment Recommendations  Other (comment) (shower seat present in room)    Recommendations for Other Services       Precautions / Restrictions Precautions Precautions: Fall Restrictions Weight Bearing Restrictions: No Other Position/Activity Restrictions: WBAT    Mobility  Bed Mobility Overal bed mobility: Needs Assistance Bed Mobility: Supine to Sit;Sit to Supine     Supine to sit: Min guard;HOB elevated Sit to supine: Min guard;HOB elevated   General bed mobility comments: cues for self assist with right leg    Transfers Overall transfer level: Needs assistance Equipment used: Rolling walker (2 wheeled) Transfers: Sit to/from Stand Sit to Stand: Min guard         General transfer comment: verbal cues for UE and LE positioning, more difficulty from lower surface however able stand from bed min/guard  Ambulation/Gait Ambulation/Gait assistance: Min guard Gait Distance (Feet): 160  Feet Assistive device: Rolling walker (2 wheeled) Gait Pattern/deviations: Step-to pattern;Antalgic;Decreased stance time - right Gait velocity: decr   General Gait Details: verbal cues for sequence, RW positioning, step length, distance to tolerance   Stairs Stairs: Yes Stairs assistance: Min guard Stair Management: Step to pattern;Forwards;Two rails Number of Stairs: 2 General stair comments: pt reports he has 2 reachable rails at home; verbal cues for safety and sequencing; pt reports understanding and declined practicing second time   Wheelchair Mobility    Modified Rankin (Stroke Patients Only)       Balance Overall balance assessment: Needs assistance         Standing balance support: Bilateral upper extremity supported Standing balance-Leahy Scale: Poor Standing balance comment: reliant on UE support                            Cognition Arousal/Alertness: Awake/alert Behavior During Therapy: WFL for tasks assessed/performed Overall Cognitive Status: Within Functional Limits for tasks assessed                                        Exercises      General Comments        Pertinent Vitals/Pain Pain Assessment: Faces Faces Pain Scale: Hurts little more Pain Location: right hip Pain Descriptors / Indicators: Sore;Aching Pain Intervention(s): Repositioned;Monitored during session    Home Living  Prior Function            PT Goals (current goals can now be found in the care plan section) Progress towards PT goals: Progressing toward goals    Frequency    Min 5X/week      PT Plan Current plan remains appropriate    Co-evaluation              AM-PAC PT "6 Clicks" Mobility   Outcome Measure  Help needed turning from your back to your side while in a flat bed without using bedrails?: A Little Help needed moving from lying on your back to sitting on the side of a flat bed without  using bedrails?: A Little Help needed moving to and from a bed to a chair (including a wheelchair)?: A Little Help needed standing up from a chair using your arms (e.g., wheelchair or bedside chair)?: A Little Help needed to walk in hospital room?: A Little Help needed climbing 3-5 steps with a railing? : A Little 6 Click Score: 18    End of Session Equipment Utilized During Treatment: Gait belt Activity Tolerance: Patient tolerated treatment well Patient left: in bed;with call bell/phone within reach;with bed alarm set Nurse Communication: Mobility status PT Visit Diagnosis: Unsteadiness on feet (R26.81);Difficulty in walking, not elsewhere classified (R26.2)     Time: 3664-4034 PT Time Calculation (min) (ACUTE ONLY): 16 min  Charges:  $Gait Training: 8-22 mins                     Paulino Door, DPT Acute Rehabilitation Services Pager: [phone] Office: [phone]    Kree Armato,KATHrine E 07/31/2021, 2:03 PM

## 2021-08-02 NOTE — Anesthesia Postprocedure Evaluation (Signed)
Anesthesia Post Note  Patient: Sergio Dominguez  Procedure(s) Performed: TOTAL HIP ARTHROPLASTY ANTERIOR APPROACH (Right: Hip)     Patient location during evaluation: PACU Anesthesia Type: General Level of consciousness: sedated Pain management: pain level controlled Vital Signs Assessment: post-procedure vital signs reviewed and stable Respiratory status: spontaneous breathing and respiratory function stable Cardiovascular status: stable Postop Assessment: no apparent nausea or vomiting Anesthetic complications: no   No notable events documented.  Last Vitals:  Vitals:   07/30/21 2038 07/31/21 0533  BP: 106/67 (!) 97/59  Pulse: 99 83  Resp: 18 18  Temp: (!) 38.1 C 36.7 C  SpO2: 94% 94%    Last Pain:  Vitals:   07/31/21 1818  TempSrc:   PainSc: 0-No pain                 Mellody Dance

## 2021-08-04 ENCOUNTER — Encounter (HOSPITAL_COMMUNITY): Payer: Self-pay | Admitting: Orthopedic Surgery

## 2021-11-29 ENCOUNTER — Other Ambulatory Visit: Payer: Self-pay | Admitting: Psychiatry

## 2021-11-29 MED ORDER — LEVETIRACETAM 500 MG PO TABS: 500.0000 mg | ORAL_TABLET | Freq: Two times a day (BID) | ORAL | 3 refills | Status: DC

## 2022-03-30 ENCOUNTER — Ambulatory Visit: Payer: Medicare PPO | Admitting: Family Medicine

## 2022-04-06 ENCOUNTER — Ambulatory Visit: Payer: Medicare PPO | Admitting: Family Medicine

## 2022-04-06 ENCOUNTER — Encounter: Payer: Self-pay | Admitting: Family Medicine

## 2022-04-06 VITALS — BP 101/69 | HR 99 | Ht 74.0 in | Wt 161.0 lb

## 2022-04-06 DIAGNOSIS — R569 Unspecified convulsions: Secondary | ICD-10-CM | POA: Diagnosis not present

## 2022-04-06 MED ORDER — LEVETIRACETAM 500 MG PO TABS: 500.0000 mg | ORAL_TABLET | Freq: Two times a day (BID) | ORAL | 3 refills | Status: DC

## 2022-04-06 NOTE — Patient Instructions (Signed)
Below is our plan: ? ?We will continue levetiracetam 500 twice daily. Make sure to stay well hydrated.  ? ?Please make sure you are consistent with timing of seizure medication. I recommend annual visit with primary care provider (PCP) for complete physical and routine blood work. We will monitor vitamin D level. I recommend daily intake of vitamin D (400-800iu) and calcium (800-1000mg ) for bone health. Discuss Dexa screening with PCP.  ? ?According to [place] law, you can not drive unless you are seizure / syncope free for at least 6 months and under physician's care. ? ?Please maintain precautions. Do not participate in activities where a loss of awareness could harm you or someone else. No swimming alone, no tub bathing, no hot tubs, no driving, no operating motorized vehicles (cars, ATVs, motocycles, etc), lawnmowers, power tools or firearms. No standing at heights, such as rooftops, ladders or stairs. Avoid hot objects such as stoves, heaters, open fires. Wear a helmet when riding a bicycle, scooter, skateboard, etc. and avoid areas of traffic. Set your water heater to 120 degrees or less.  ? ? ?Please make sure you are staying well hydrated. I recommend 50-60 ounces daily. Well balanced diet and regular exercise encouraged. Consistent sleep schedule with 6-8 hours recommended.  ? ?Please continue follow up with care team as directed.  ? ?Follow up with me in 1 year  ? ?You may receive a survey regarding today's visit. I encourage you to leave honest feed back as I do use this information to improve patient care. Thank you for seeing me today!  ? ? ?

## 2022-04-06 NOTE — Progress Notes (Signed)
? ? ?PATIENT: Sergio Dominguez ?DOB: Mar 25, 1951 ? ?REASON FOR VISIT: follow up ?HISTORY FROM: patient ? ?Chief Complaint  ?Patient presents with  ? Follow-up  ?  Rm 13, w wife. Here for yearly sz f/u. Pt reports having 2 episodes. 1 in June and Oct of last year. Had gone 14 months w/o a sz episode. Pt had missed doses before sz. Has been taking meds regularly since.   ?  ? ?HISTORY OF PRESENT ILLNESS: ? ?04/06/22 ALL: ?Sergio Dominguez returns for follow up for seizure. He continues levetiracetam 500mg  twice daily.  ? ?He had a seizure event in 06/2021. He admits he had missed a couple doses of levetiracetam and was not well hydrated. He went to [place] and had a seizure in his sleep. He had another event 09/2021 in his sleep. He can't remember if he missed any doses of levetiracetam. He does endorse having a glass of wine that he feels contributed to seizure. No events since.  ? ?He has been more consistent with medication regimen as well as staying well hydrated since 09/2021. No events since. He is drinking more water. He is sleeping well. He was doing good with activity but fell and broke his hip in August. He is back to walking about 1/2 mile a few days a week.  ? ?09/28/2020 ALL:  ?Sergio Dominguez is a 71 y.o. male here today for follow up for concerns of seizure like activity. He was seen by Dr Felecia Shelling 03/2020. Dr Felecia Shelling suggested to increase levetiracetam from 500 BID to 750mg  BID and add gabapentin 300mg  daily for insomnia. He was hesitant to make changes and continued levetiracetam 500mg  BID only. He did not start gabapentin. MRI brian with [facility] 09/01/2020 was unremarkable.  ? ?He reports that he is doing fine. He denies any further episodes. He is no longer smoking marijuana. He is sleeping better. He feels that he is getting better quality sleep. He is eating regular well balanced meals.  ? ?HISTORY: (copied from Dr Garth Bigness note on 03/27/2020) ? ?I had the pleasure of seeing patient, Sergio Dominguez, at [facility] for neurologic consultation regarding his 2 episodes of unresponsiveness. ?  ?He is a 71 year old man who had suspected seizures 12/17/2019.   His wife was living with the daughter during the week and him on weekends (new grandchild).   He recalls planning to make breakfast and his wife told him she called several times that morning.   He was smoking marijuana at the time regularly.   Around 11 am he noted the car alarm was going off and he opened the door to check the car.   He does not recall after that.  His wife found him unresponsive in the second floor and he had urinated on himself.  He was rigid and teeth clenched.   There also were changes downstairs.   He had knocked over a planter and broke a picture frame.  His wife called the ambulance and he regained consciousness and he went to the bathroom (he has no memory of this).    He was taken to the ED.   He has no memory of about 48 hours while in the hospital. ?  ?He had MRIs showing no acute brain lesions and mildly advanced for age changes.    MRI of the spine showed T1, T3-T6, T9, L1, L2, L3, L5 endplate compression fractures and interspinous ligament injury T3-T4 and T6-T7.    Tox screen showed THC only.  He has increased WBC (20.9 mostly neutrophil) and elevated sugars and HgbA1c   ?  ?On 02/29/2020, he had an episode out of sleep with shaking and unresponsiveness.   No tongue biting or urination.   He woke up in the ambulance mildly confused.   He had smoked a small amount of MJ that week (after not smoking any Jan or Feb).   He was placed on Keppra 500 mg po bid and discharged home.  CT showed no acute. ?  ?He sleeps poorly due to waking up in the middle of the night and not falling back asleep.   ?  ?EEG 12/18/2019 showed slow generalized activity c/w mild to moderate diffuse encephalopathy and excessive beta (likely benzodiazepine) ?  ?I personally reviewed the images of the brain, spine.  MRI brain 12/19/2019 showed atrophy and chronic  microvascular ischemic changes, a little more than typical.   He has sinusitis.  MRI of the spine showed T1, T3-T6, T9, L1, L2, L3, L5 endplate compression fractures and interspinous ligament injury T3-T4 and T6-T7.  Due to edema noted on the STIR images, these are acute to subacute ? ? ?REVIEW OF SYSTEMS: Out of a complete 14 system review of symptoms, the patient complains only of the following symptoms, seizures, back pain and all other reviewed systems are negative. ? ?ALLERGIES: ?Allergies  ?Allergen Reactions  ? Oxycodone Hcl   ?  REACTION: NAUSEA/VOMITNG  ? ? ?HOME MEDICATIONS: ?Outpatient Medications Prior to Visit  ?Medication Sig Dispense Refill  ? cholecalciferol (VITAMIN D3) 25 MCG (1000 UNIT) tablet Take 1,000 Units by mouth daily.    ? metFORMIN (GLUCOPHAGE) 500 MG tablet Take 500 mg by mouth 2 (two) times daily with a meal.    ? zinc gluconate 50 MG tablet Take 50 mg by mouth daily.    ? levETIRAcetam (KEPPRA) 500 MG tablet Take 1 tablet (500 mg total) by mouth 2 (two) times daily. 180 tablet 3  ? Calcium-Vitamin D ([employer] CALCIUM-600/VIT D PO) Take by mouth.    ? HYDROcodone-acetaminophen (NORCO/VICODIN) 5-325 MG tablet Take 1-2 tablets by mouth every 4 (four) hours as needed for moderate pain (pain score 4-6). 40 tablet 0  ? ?No facility-administered medications prior to visit.  ? ? ?PAST MEDICAL HISTORY: ?Past Medical History:  ?Diagnosis Date  ? Diabetes mellitus without complication ([place])   ? ? ?PAST SURGICAL HISTORY: ?Past Surgical History:  ?Procedure Laterality Date  ? "hairlip"  1961  ? APPENDECTOMY    ? ELBOW SURGERY    ? TOTAL HIP ARTHROPLASTY Right 07/29/2021  ? Procedure: TOTAL HIP ARTHROPLASTY ANTERIOR APPROACH;  Surgeon: Rod Can, MD;  Location: [facility];  Service: Orthopedics;  Laterality: Right;  ? ? ?FAMILY HISTORY: ?Family History  ?Problem Relation Age of Onset  ? Multiple sclerosis Mother   ? Multiple sclerosis Brother   ? Lung cancer Brother   ? Seizures Neg Hx   ? ? ?SOCIAL  HISTORY: ?Social History  ? ?Socioeconomic History  ? Marital status: Married  ?  Spouse name: Not on file  ? Number of children: 4  ? Years of education: associates  ? Highest education level: Not on file  ?Occupational History  ? Not on file  ?Tobacco Use  ? Smoking status: Former  ?  Types: Cigarettes  ?  Quit date: 2012  ?  Years since quitting: 11.2  ? Smokeless tobacco: Never  ?Vaping Use  ? Vaping Use: Never used  ?Substance and Sexual Activity  ? Alcohol use:  Not Currently  ?  Comment: none since 2019  ? Drug use: Not Currently  ?  Types: Marijuana  ?  Comment: stopped 12/17/2019. has smoked 1 since then.  ? Sexual activity: Not on file  ?Other Topics Concern  ? Not on file  ?Social History Narrative  ? Lives at home with wife  ? Right handed  ? Caffeine: at least 3 cups per day  ? ?Social Determinants of Health  ? ?Financial Resource Strain: Not on file  ?Food Insecurity: Not on file  ?Transportation Needs: Not on file  ?Physical Activity: Not on file  ?Stress: Not on file  ?Social Connections: Not on file  ?Intimate Partner Violence: Not on file  ? ? ? ? ?PHYSICAL EXAM ? ?Vitals:  ? 04/06/22 1118  ?BP: 101/69  ?Pulse: 99  ?Weight: 161 lb (73 kg)  ?Height: 6\' 2"  (1.88 m)  ? ?Body mass index is 20.67 kg/m?. ? ?Generalized: Well developed, in no acute distress  ?Cardiology: normal rate and rhythm, no murmur noted ?Respiratory: clear to auscultation bilaterally  ?Neurological examination  ?Mentation: Alert oriented to time, place, history taking. Follows all commands speech and language fluent ?Cranial nerve II-XII: Pupils were equal round reactive to light. Extraocular movements were full, visual field were full  ?Motor: The motor testing reveals 5 over 5 strength of all 4 extremities. Good symmetric motor tone is noted throughout.  ?Gait and station: Gait is normal.  ? ? ?DIAGNOSTIC DATA (LABS, IMAGING, TESTING) ?- I reviewed patient records, labs, notes, testing and imaging myself where available. ? ?   ?  View : No data to display.  ?  ?  ?  ?  ? ?Lab Results  ?Component Value Date  ? WBC 9.0 07/31/2021  ? HGB 10.4 (L) 07/31/2021  ? HCT 32.2 (L) 07/31/2021  ? MCV 90.2 07/31/2021  ? PLT 186 07/31/2021  ? ?   ?[place]

## 2022-11-22 ENCOUNTER — Other Ambulatory Visit: Payer: Self-pay | Admitting: Psychiatry

## 2023-04-10 NOTE — Patient Instructions (Signed)
Below is our plan:  We will continue levetiracetam 500mg twice daily   Please make sure you are consistent with timing of seizure medication. I recommend annual visit with primary care provider (PCP) for complete physical and routine blood work. I recommend daily intake of vitamin D (400-800iu) and calcium (800-1000mg) for bone health. Discuss Dexa screening with PCP.   According to [place] law, you can not drive unless you are seizure / syncope free for at least 6 months and under physician's care.  Please maintain precautions. Do not participate in activities where a loss of awareness could harm you or someone else. No swimming alone, no tub bathing, no hot tubs, no driving, no operating motorized vehicles (cars, ATVs, motocycles, etc), lawnmowers, power tools or firearms. No standing at heights, such as rooftops, ladders or stairs. Avoid hot objects such as stoves, heaters, open fires. Wear a helmet when riding a bicycle, scooter, skateboard, etc. and avoid areas of traffic. Set your water heater to 120 degrees or less.  Please make sure you are staying well hydrated. I recommend 50-60 ounces daily. Well balanced diet and regular exercise encouraged. Consistent sleep schedule with 6-8 hours recommended.   Please continue follow up with care team as directed.   Follow up with me in 1 year  You may receive a survey regarding today's visit. I encourage you to leave honest feed back as I do use this information to improve patient care. Thank you for seeing me today!    

## 2023-04-10 NOTE — Progress Notes (Unsigned)
PATIENT: Sergio Dominguez DOB: 1951/08/23  REASON FOR VISIT: follow up HISTORY FROM: patient  No chief complaint on file.    HISTORY OF PRESENT ILLNESS:  04/10/23 ALL: Sergio Dominguez returns for follow up for seizures. He continues levetiracetam  BID.   04/06/2022 ALL: Sergio Dominguez returns for follow up for seizure. He continues levetiracetam  twice daily.   He had a seizure event in 06/2021. He admits he had missed a couple doses of levetiracetam and was not well hydrated. He went to [place] and had a seizure in his sleep. He had another event 09/2021 in his sleep. He can't remember if he missed any doses of levetiracetam. He does endorse having a glass of wine that he feels contributed to seizure. No events since.   He has been more consistent with medication regimen as well as staying well hydrated since 09/2021. No events since. He is drinking more water. He is sleeping well. He was doing good with activity but fell and broke his hip in August. He is back to walking about 1/2 mile a few days a week.   09/28/2020 ALL:  Sergio Dominguez is a 72 y.o. male here today for follow up for concerns of seizure like activity. He was seen by Dr Epimenio Foot 03/2020. Dr Epimenio Foot suggested to increase levetiracetam from 500 BID to  BID and add gabapentin  daily for insomnia. He was hesitant to make changes and continued levetiracetam  BID only. He did not start gabapentin. MRI brian with [facility] 09/01/2020 was unremarkable.   He reports that he is doing fine. He denies any further episodes. He is no longer smoking marijuana. He is sleeping better. He feels that he is getting better quality sleep. He is eating regular well balanced meals.   HISTORY: (copied from Dr Bonnita Hollow note on 03/27/2020)  I had the pleasure of seeing patient, Sergio Dominguez, at [facility] for neurologic consultation regarding his 2 episodes of unresponsiveness.   He is a 72 year old man who had suspected seizures  12/17/2019.   His wife was living with the daughter during the week and him on weekends (new grandchild).   He recalls planning to make breakfast and his wife told him she called several times that morning.   He was smoking marijuana at the time regularly.   Around 11 am he noted the car alarm was going off and he opened the door to check the car.   He does not recall after that.  His wife found him unresponsive in the second floor and he had urinated on himself.  He was rigid and teeth clenched.   There also were changes downstairs.   He had knocked over a planter and broke a picture frame.  His wife called the ambulance and he regained consciousness and he went to the bathroom (he has no memory of this).    He was taken to the ED.   He has no memory of about 48 hours while in the hospital.   He had MRIs showing no acute brain lesions and mildly advanced for age changes.    MRI of the spine showed T1, T3-T6, T9, L1, L2, L3, L5 endplate compression fractures and interspinous ligament injury T3-T4 and T6-T7.    Tox screen showed THC only.   He has increased WBC (20.9 mostly neutrophil) and elevated sugars and HgbA1c     On 02/29/2020, he had an episode out of sleep with shaking and unresponsiveness.   No tongue biting or  urination.   He woke up in the ambulance mildly confused.   He had smoked a small amount of MJ that week (after not smoking any Jan or Feb).   He was placed on Keppra 500 mg po bid and discharged home.  CT showed no acute.   He sleeps poorly due to waking up in the middle of the night and not falling back asleep.     EEG 12/18/2019 showed slow generalized activity c/w mild to moderate diffuse encephalopathy and excessive beta (likely benzodiazepine)   I personally reviewed the images of the brain, spine.  MRI brain 12/19/2019 showed atrophy and chronic microvascular ischemic changes, a little more than typical.   He has sinusitis.  MRI of the spine showed T1, T3-T6, T9, L1, L2, L3, L5  endplate compression fractures and interspinous ligament injury T3-T4 and T6-T7.  Due to edema noted on the STIR images, these are acute to subacute   REVIEW OF SYSTEMS: Out of a complete 14 system review of symptoms, the patient complains only of the following symptoms, seizures, back pain and all other reviewed systems are negative.  ALLERGIES: Allergies  Allergen Reactions   Oxycodone Hcl     REACTION: NAUSEA/VOMITNG    HOME MEDICATIONS: Outpatient Medications Prior to Visit  Medication Sig Dispense Refill   cholecalciferol (VITAMIN D3) 25 MCG (1000 UNIT) tablet Take 1,000 Units by mouth daily.     levETIRAcetam (KEPPRA) 500 MG tablet TAKE 1 TABLET(500 MG) BY MOUTH TWICE DAILY 180 tablet 3   metFORMIN (GLUCOPHAGE) 500 MG tablet Take 500 mg by mouth 2 (two) times daily with a meal.     zinc gluconate 50 MG tablet Take 50 mg by mouth daily.     No facility-administered medications prior to visit.    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   Procedure: TOTAL HIP ARTHROPLASTY ANTERIOR APPROACH;  Surgeon: Samson Frederic, MD;  Location: [facility];  Service: Orthopedics;  Laterality: Right;    FAMILY HISTORY: Family History  Problem Relation Age of Onset   Multiple sclerosis Mother    Multiple sclerosis Brother    Lung cancer Brother    Seizures Neg Hx     SOCIAL HISTORY: Social History   Socioeconomic History   Marital status: Married    Spouse name: Not on file   Number of children: 4   Years of education: associates   Highest education level: Not on file  Occupational History   Not on file  Tobacco Use   Smoking status: Former    Types: Cigarettes    Quit date: 2012    Years since quitting: 12.2   Smokeless tobacco: Never   Vaping Use   Vaping Use: Never used  Substance and Sexual Activity   Alcohol use: Not Currently    Comment: none since 2019   Drug use: Not Currently    Types: Marijuana    Comment: stopped 12/17/2019. has smoked 1 since then.   Sexual activity: Not on file  Other Topics Concern   Not on file  Social History Narrative   Lives at home with wife   Right handed   Caffeine: at least 3 cups per day   Social Determinants of Health  Financial Resource Strain: Not on file  Food Insecurity: Not on file  Transportation Needs: Not on file  Physical Activity: Not on file  Stress: Not on file  Social Connections: Not on file  Intimate Partner Violence: Not on file      PHYSICAL EXAM  There were no vitals filed for this visit.  There is no height or weight on file to calculate BMI.  Generalized: Well developed, in no acute distress  Cardiology: normal rate and rhythm, no murmur noted Respiratory: clear to auscultation bilaterally  Neurological examination  Mentation: Alert oriented to time, place, history taking. Follows all commands speech and language fluent Cranial nerve II-XII: Pupils were equal round reactive to light. Extraocular movements were full, visual field were full  Motor: The motor testing reveals 5 over 5 strength of all 4 extremities. Good symmetric motor tone is noted throughout.  Gait and station: Gait is normal.    DIAGNOSTIC DATA (LABS, IMAGING, TESTING) - I reviewed patient records, labs, notes, testing and imaging myself where available.      No data to display           Lab Results  Component Value Date   WBC 9.0 07/31/2021   HGB 10.4 (L) 07/31/2021   HCT 32.2 (L) 07/31/2021   MCV 90.2 07/31/2021   PLT 186 07/31/2021      Component Value Date/Time   NA 136 07/31/2021 0525   K 3.8 07/31/2021 0525   CL 104 07/31/2021 0525   CO2 27 07/31/2021 0525   GLUCOSE 137 (H) 07/31/2021 0525   BUN 15 07/31/2021 0525   CREATININE 0.73 07/31/2021  0525   CALCIUM 8.3 (L) 07/31/2021 0525   PROT 7.0 02/29/2020 0923   ALBUMIN 3.6 02/29/2020 0923   AST 17 02/29/2020 0923   ALT 19 02/29/2020 0923   ALKPHOS 96 02/29/2020 0923   BILITOT 0.5 02/29/2020 0923   GFRNONAA >60 07/31/2021 0525   GFRAA >60 02/29/2020 0923   No results found for: "CHOL", "HDL", "LDLCALC", "LDLDIRECT", "TRIG", "CHOLHDL" Lab Results  Component Value Date   HGBA1C 7.4 (H) 07/28/2021   No results found for: "VITAMINB12" Lab Results  Component Value Date   TSH 1.699 12/18/2019     ASSESSMENT AND PLAN 72 y.o. year old male  has a past medical history of Diabetes mellitus without complication (HCC). here with   No diagnosis found.    Sergio Dominguez is doing very well, today. He has tolerated levetiracetam  BID. He does admit to not taking AED regularly in the past. Since being more consistent, he has remained seizure free. We will continue current treatment plan. He was unable to tolerate ER dosing. Seizure precautions reviewed. Healthy lifestyle habits discussed. Caution with alcohol use. He will continue close follow up with PCP. He will return to see me in 1 year. He verbalizes understanding and agreement with this plan.    No orders of the defined types were placed in this encounter.    No orders of the defined types were placed in this encounter.        Shawnie Dapper, FNP-C 04/10/2023, 5:17 PM [facility] [address] [place], [place] [address] [phone]

## 2023-04-11 ENCOUNTER — Ambulatory Visit: Payer: Medicare PPO | Admitting: Family Medicine

## 2023-04-11 ENCOUNTER — Encounter: Payer: Self-pay | Admitting: Family Medicine

## 2023-04-11 VITALS — BP 99/67 | HR 81 | Ht 74.0 in | Wt 165.0 lb

## 2023-04-11 DIAGNOSIS — R569 Unspecified convulsions: Secondary | ICD-10-CM | POA: Diagnosis not present

## 2023-04-11 MED ORDER — LEVETIRACETAM 500 MG PO TABS: ORAL_TABLET | ORAL | 3 refills | Status: DC

## 2023-12-06 LAB — EXTERNAL GENERIC LAB PROCEDURE: COLOGUARD: NEGATIVE

## 2023-12-06 LAB — COLOGUARD: COLOGUARD: NEGATIVE

## 2023-12-21 ENCOUNTER — Telehealth: Payer: Self-pay | Admitting: Family Medicine

## 2023-12-21 NOTE — Telephone Encounter (Signed)
According to our chart it appears the patient had a script called in April 2024, for 3 month supply with 3 refills. I do not see where a refill request has been sent from the pharmacy. Contacted the pharmacy and they open at 10 am. I left a VM advising the pharmacy to call back if anything else is needed or resend a refill request.   Called the patient as well to make him aware that we never received a request. The patient states on his bottle that was filled in September it states no refills and that the pharmacy advised him that they had sent a request to [place]. I informed the patient that I'm unsure what happened but that I did leave a message with the pharmacy advising that a script was sent in April with year worth of refills. Told him I advised the pharmacy to either resend a refill request to [place] or call us back with any concerns. Pt was appreciative for the call back.

## 2023-12-21 NOTE — Telephone Encounter (Signed)
Pt called stating that his pharmacy has been trying to get a hold of the office for a refill on his levETIRAcetam (KEPPRA) 500 MG tablet for over a week and he is needing to know when this will be taken care of. Pt requested a call back from the RN today. Please advise.

## 2024-04-01 NOTE — Progress Notes (Unsigned)
 PATIENT: Sergio Dominguez DOB: 11-25-1951  REASON FOR VISIT: follow up HISTORY FROM: patient  No chief complaint on file.    HISTORY OF PRESENT ILLNESS:  04/01/24 ALL: Sergio Dominguez returns for follow up for seizures. He continues levetiracetam 500mg  BID.   04/11/2023 ALL:  Sergio Dominguez returns for follow up for seizures. He continues levetiracetam 500mg  BID. He is tolerating med well. No seizure activity. Last event 09/2021. He rarely misses dose. He takes as close to 10pm and 10am as possible. He is driving without difficulty. He sleeps fairly well. He usually goes to bed around 10pm and wakes at 4. He will watch TV for an hour or so then goes back to sleep for a couple hours. He is taking vitamin D. Could not tolerate calcium or Fosamax. He has declined Dexa but continues discussion with PCP annually.   04/06/2022 ALL: Sergio Dominguez returns for follow up for seizure. He continues levetiracetam 500mg  twice daily.   He had a seizure event in 06/2021. He admits he had missed a couple doses of levetiracetam and was not well hydrated. He went to [place] and had a seizure in his sleep. He had another event 09/2021 in his sleep. He can't remember if he missed any doses of levetiracetam. He does endorse having a glass of wine that he feels contributed to seizure. No events since.   He has been more consistent with medication regimen as well as staying well hydrated since 09/2021. No events since. He is drinking more water. He is sleeping well. He was doing good with activity but fell and broke his hip in August. He is back to walking about 1/2 mile a few days a week.   09/28/2020 ALL:  Sergio Dominguez is a 73 y.o. male here today for follow up for concerns of seizure like activity. He was seen by Dr Epimenio Foot 03/2020. Dr Epimenio Foot suggested to increase levetiracetam from 500 BID to 750mg  BID and add gabapentin 300mg  daily for insomnia. He was hesitant to make changes and continued levetiracetam 500mg  BID only. He did not start  gabapentin. MRI brian with [facility] 09/01/2020 was unremarkable.   He reports that he is doing fine. He denies any further episodes. He is no longer smoking marijuana. He is sleeping better. He feels that he is getting better quality sleep. He is eating regular well balanced meals.   HISTORY: (copied from Dr Bonnita Hollow note on 03/27/2020)  I had the pleasure of seeing patient, Sergio Dominguez, at [facility] for neurologic consultation regarding his 2 episodes of unresponsiveness.   He is a 73 year old man who had suspected seizures 12/17/2019.   His wife was living with the daughter during the week and him on weekends (new grandchild).   He recalls planning to make breakfast and his wife told him she called several times that morning.   He was smoking marijuana at the time regularly.   Around 11 am he noted the car alarm was going off and he opened the door to check the car.   He does not recall after that.  His wife found him unresponsive in the second floor and he had urinated on himself.  He was rigid and teeth clenched.   There also were changes downstairs.   He had knocked over a planter and broke a picture frame.  His wife called the ambulance and he regained consciousness and he went to the bathroom (he has no memory of this).    He was taken to the  ED.   He has no memory of about 48 hours while in the hospital.   He had MRIs showing no acute brain lesions and mildly advanced for age changes.    MRI of the spine showed T1, T3-T6, T9, L1, L2, L3, L5 endplate compression fractures and interspinous ligament injury T3-T4 and T6-T7.    Tox screen showed THC only.   He has increased WBC (20.9 mostly neutrophil) and elevated sugars and HgbA1c     On 02/29/2020, he had an episode out of sleep with shaking and unresponsiveness.   No tongue biting or urination.   He woke up in the ambulance mildly confused.   He had smoked a small amount of MJ that week (after not smoking any Jan or Feb).   He was  placed on Keppra 500 mg po bid and discharged home.  CT showed no acute.   He sleeps poorly due to waking up in the middle of the night and not falling back asleep.     EEG 12/18/2019 showed slow generalized activity c/w mild to moderate diffuse encephalopathy and excessive beta (likely benzodiazepine)   I personally reviewed the images of the brain, spine.  MRI brain 12/19/2019 showed atrophy and chronic microvascular ischemic changes, a little more than typical.   He has sinusitis.  MRI of the spine showed T1, T3-T6, T9, L1, L2, L3, L5 endplate compression fractures and interspinous ligament injury T3-T4 and T6-T7.  Due to edema noted on the STIR images, these are acute to subacute   REVIEW OF SYSTEMS: Out of a complete 14 system review of symptoms, the patient complains only of the following symptoms, seizures, back pain and all other reviewed systems are negative.  ALLERGIES: Allergies  Allergen Reactions   Oxycodone Hcl     REACTION: NAUSEA/VOMITNG    HOME MEDICATIONS: Outpatient Medications Prior to Visit  Medication Sig Dispense Refill   Alpha-Lipoic Acid 200 MG CAPS Take 200 mg by mouth daily.     cholecalciferol (VITAMIN D3) 25 MCG (1000 UNIT) tablet Take 1,000 Units by mouth daily.     levETIRAcetam (KEPPRA) 500 MG tablet TAKE 1 TABLET(500 MG) BY MOUTH TWICE DAILY 180 tablet 3   metFORMIN (GLUCOPHAGE) 500 MG tablet Take 500 mg by mouth 2 (two) times daily with a meal.     tamsulosin (FLOMAX) 0.4 MG CAPS capsule Take 0.4 mg by mouth.     zinc gluconate 50 MG tablet Take 50 mg by mouth daily.     No facility-administered medications prior to visit.    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   Procedure: TOTAL HIP ARTHROPLASTY  ANTERIOR APPROACH;  Surgeon: Samson Frederic, MD;  Location: [facility];  Service: Orthopedics;  Laterality: Right;    FAMILY HISTORY: Family History  Problem Relation Age of Onset   Multiple sclerosis Mother    Multiple sclerosis Brother    Lung cancer Brother    Seizures Neg Hx     SOCIAL HISTORY: Social History   Socioeconomic History   Marital status: Married    Spouse name: Not on file   Number of children: 4   Years of education: associates   Highest education level: Not on file  Occupational History  Not on file  Tobacco Use   Smoking status: Former    Current packs/day: 0.00    Types: Cigarettes    Quit date: 2012    Years since quitting: 13.2   Smokeless tobacco: Never  Vaping Use   Vaping status: Never Used  Substance and Sexual Activity   Alcohol use: Not Currently    Comment: none since 2019   Drug use: Not Currently    Types: Marijuana    Comment: stopped 12/17/2019. has smoked 1 since then.   Sexual activity: Not on file  Other Topics Concern   Not on file  Social History Narrative   Lives at home with wife   Right handed   Caffeine: at least 3 cups per day   Social Drivers of Health   Financial Resource Strain: Low Risk  (10/12/2023)   Received from [facility]   Overall Financial Resource Strain (CARDIA)    Difficulty of Paying Living Expenses: Not hard at all  Food Insecurity: No Food Insecurity (10/12/2023)   Received from [facility]   Hunger Vital Sign    Worried About Running Out of Food in the Last Year: Never true    Ran Out of Food in the Last Year: Never true  Transportation Needs: No Transportation Needs (10/12/2023)   Received from [facility] - Transportation    Lack of Transportation (Medical): No    Lack of Transportation (Non-Medical): No  Physical Activity: Insufficiently Active (10/12/2023)   Received from [facility]   Exercise Vital Sign    Days of Exercise per Week: 2 days    Minutes of Exercise per  Session: 60 min  Stress: No Stress Concern Present (10/12/2023)   Received from [facility] - Occupational Stress Questionnaire    Feeling of Stress : Only a little  Social Connections: Unknown (10/12/2023)   Received from [facility]   Social Connection and Isolation Panel [NHANES]    Frequency of Communication with Friends and Family: Not on file    Frequency of Social Gatherings with Friends and Family: Not on file    Attends Religious Services: Not on file    Active Member of Clubs or Organizations: Not on file    Attends Banker Meetings: Not on file    Marital Status: Married  Intimate Partner Violence: Not At Risk (10/12/2023)   Received from [facility]   HITS    Over the last 12 months how often did your partner physically hurt you?: Never    Over the last 12 months how often did your partner insult you or talk down to you?: Never    Over the last 12 months how often did your partner threaten you with physical harm?: Never    Over the last 12 months how often did your partner scream or curse at you?: Never      PHYSICAL EXAM  There were no vitals filed for this visit.   There is no height or weight on file to calculate BMI.  Generalized: Well developed, in no acute distress  Cardiology: normal rate and rhythm, no murmur noted Respiratory: clear to auscultation bilaterally  Neurological examination  Mentation: Alert oriented to time, place, history taking. Follows all commands speech and language fluent Cranial nerve II-XII: Pupils were equal round reactive to light. Extraocular movements were full, visual field were full  Motor: The motor testing reveals 5 over 5 strength of all 4 extremities.  Good symmetric motor tone is noted throughout.  Gait and station: Gait is normal.    DIAGNOSTIC DATA (LABS, IMAGING, TESTING) - I reviewed patient records, labs, notes, testing and imaging myself where  available.      No data to display           Lab Results  Component Value Date   WBC 9.0 07/31/2021   HGB 10.4 (L) 07/31/2021   HCT 32.2 (L) 07/31/2021   MCV 90.2 07/31/2021   PLT 186 07/31/2021      Component Value Date/Time   NA 136 07/31/2021 0525   K 3.8 07/31/2021 0525   CL 104 07/31/2021 0525   CO2 27 07/31/2021 0525   GLUCOSE 137 (H) 07/31/2021 0525   BUN 15 07/31/2021 0525   CREATININE 0.73 07/31/2021 0525   CALCIUM 8.3 (L) 07/31/2021 0525   PROT 7.0 02/29/2020 0923   ALBUMIN 3.6 02/29/2020 0923   AST 17 02/29/2020 0923   ALT 19 02/29/2020 0923   ALKPHOS 96 02/29/2020 0923   BILITOT 0.5 02/29/2020 0923   GFRNONAA >60 07/31/2021 0525   GFRAA >60 02/29/2020 0923   No results found for: "CHOL", "HDL", "LDLCALC", "LDLDIRECT", "TRIG", "CHOLHDL" Lab Results  Component Value Date   HGBA1C 7.4 (H) 07/28/2021   No results found for: "VITAMINB12" Lab Results  Component Value Date   TSH 1.699 12/18/2019     ASSESSMENT AND PLAN 73 y.o. year old male  has a past medical history of Diabetes mellitus without complication (HCC). here with   No diagnosis found.    Mr Mena is doing very well, today. He has tolerated levetiracetam 500mg  BID. He has remained seizure free since 09/2021. We will continue current treatment plan. He was unable to tolerate ER dosing. Seizure precautions reviewed. Healthy lifestyle habits discussed. Caution with alcohol use. He will continue close follow up with PCP. Consider Dexa and treatments for osteopenia per PCP. He will return to see me in 1 year. He verbalizes understanding and agreement with this plan.    No orders of the defined types were placed in this encounter.    No orders of the defined types were placed in this encounter.        Shawnie Dapper, FNP-C 04/01/2024, 12:53 PM [facility] [address] [place], [place] [address] [phone]

## 2024-04-03 ENCOUNTER — Encounter: Payer: Self-pay | Admitting: Family Medicine

## 2024-04-03 ENCOUNTER — Ambulatory Visit: Payer: Medicare PPO | Admitting: Family Medicine

## 2024-04-03 VITALS — BP 110/80 | HR 69 | Ht 74.0 in | Wt 171.0 lb

## 2024-04-03 DIAGNOSIS — R569 Unspecified convulsions: Secondary | ICD-10-CM | POA: Diagnosis not present

## 2024-04-03 MED ORDER — LEVETIRACETAM 500 MG PO TABS: ORAL_TABLET | ORAL | 3 refills | Status: AC

## 2024-04-03 NOTE — Patient Instructions (Signed)
 Below is our plan:  We will continue levetiracetam 500mg  twice daily. Be careful not to miss doses.   Please make sure you are consistent with timing of seizure medication. I recommend annual visit with primary care provider (PCP) for complete physical and routine blood work. I recommend daily intake of vitamin D (400-800iu) and calcium (800-1000mg ) for bone health. Discuss Dexa screening with PCP.   According to [place] law, you can not drive unless you are seizure / syncope free for at least 6 months and under physician's care.  Please maintain precautions. Do not participate in activities where a loss of awareness could harm you or someone else. No swimming alone, no tub bathing, no hot tubs, no driving, no operating motorized vehicles (cars, ATVs, motocycles, etc), lawnmowers, power tools or firearms. No standing at heights, such as rooftops, ladders or stairs. Avoid hot objects such as stoves, heaters, open fires. Wear a helmet when riding a bicycle, scooter, skateboard, etc. and avoid areas of traffic. Set your water heater to 120 degrees or less.  SUDEP is the sudden, unexpected death of someone with epilepsy, who was otherwise healthy. In SUDEP cases, no other cause of death is found when an autopsy is done. Each year, more than 1 in 1,000 people with epilepsy die from SUDEP. This is the leading cause of death in people with uncontrolled seizures. Until further answers are available, the best way to prevent SUDEP is to lower your risk by controlling seizures. Research has found that people with all types of epilepsy that experience convulsive seizures can be at risk.  Please make sure you are staying well hydrated. I recommend 50-60 ounces daily. Well balanced diet and regular exercise encouraged. Consistent sleep schedule with 6-8 hours recommended.   Please continue follow up with care team as directed.   Follow up with me in 1 year   You may receive a survey regarding today's visit. I  encourage you to leave honest feed back as I do use this information to improve patient care. Thank you for seeing me today!

## 2024-04-10 ENCOUNTER — Ambulatory Visit: Payer: Medicare PPO | Admitting: Family Medicine

## 2024-10-02 ENCOUNTER — Telehealth: Payer: Self-pay | Admitting: Family Medicine

## 2024-10-02 NOTE — Telephone Encounter (Signed)
 I called pt to reschedule his April 2026 appt from bump list, first available is October 2026, he says that is unacceptable; do you have anything sooner?

## 2024-10-02 NOTE — Telephone Encounter (Signed)
 Please offer 04/07/25 at 1pm with Dr. Vear

## 2024-10-02 NOTE — Telephone Encounter (Signed)
 Confirmed pt scheduled for 04/07/24.

## 2025-04-03 ENCOUNTER — Ambulatory Visit: Admitting: Family Medicine

## 2025-04-07 ENCOUNTER — Ambulatory Visit: Admitting: Neurology
